# Patient Record
Sex: Female | Born: 1945 | Race: White | Hispanic: No | Marital: Married | Smoking: Former smoker
Health system: Southern US, Community
[De-identification: ages and names within clinical notes are randomized; demographics above are authoritative.]

## PROBLEM LIST (undated history)

## (undated) DIAGNOSIS — R42 Dizziness and giddiness: Secondary | ICD-10-CM

## (undated) DIAGNOSIS — E785 Hyperlipidemia, unspecified: Secondary | ICD-10-CM

## (undated) DIAGNOSIS — T148XXA Other injury of unspecified body region, initial encounter: Secondary | ICD-10-CM

## (undated) DIAGNOSIS — E669 Obesity, unspecified: Secondary | ICD-10-CM

## (undated) DIAGNOSIS — F419 Anxiety disorder, unspecified: Secondary | ICD-10-CM

## (undated) DIAGNOSIS — Z87898 Personal history of other specified conditions: Secondary | ICD-10-CM

## (undated) DIAGNOSIS — K219 Gastro-esophageal reflux disease without esophagitis: Secondary | ICD-10-CM

## (undated) DIAGNOSIS — S82899A Other fracture of unspecified lower leg, initial encounter for closed fracture: Secondary | ICD-10-CM

## (undated) DIAGNOSIS — R350 Frequency of micturition: Secondary | ICD-10-CM

## (undated) DIAGNOSIS — I4729 Other ventricular tachycardia: Secondary | ICD-10-CM

## (undated) DIAGNOSIS — I2510 Atherosclerotic heart disease of native coronary artery without angina pectoris: Secondary | ICD-10-CM

## (undated) DIAGNOSIS — G4700 Insomnia, unspecified: Secondary | ICD-10-CM

## (undated) DIAGNOSIS — K7689 Other specified diseases of liver: Secondary | ICD-10-CM

## (undated) DIAGNOSIS — N8110 Cystocele, unspecified: Secondary | ICD-10-CM

## (undated) DIAGNOSIS — I472 Ventricular tachycardia: Secondary | ICD-10-CM

## (undated) DIAGNOSIS — J329 Chronic sinusitis, unspecified: Secondary | ICD-10-CM

## (undated) DIAGNOSIS — S62109A Fracture of unspecified carpal bone, unspecified wrist, initial encounter for closed fracture: Secondary | ICD-10-CM

## (undated) DIAGNOSIS — S335XXA Sprain of ligaments of lumbar spine, initial encounter: Secondary | ICD-10-CM

## (undated) DIAGNOSIS — M810 Age-related osteoporosis without current pathological fracture: Secondary | ICD-10-CM

## (undated) DIAGNOSIS — T7840XA Allergy, unspecified, initial encounter: Secondary | ICD-10-CM

## (undated) HISTORY — DX: Allergy, unspecified, initial encounter: T78.40XA

## (undated) HISTORY — DX: Age-related osteoporosis without current pathological fracture: M81.0

## (undated) HISTORY — DX: Hyperlipidemia, unspecified: E78.5

## (undated) HISTORY — PX: ABDOMINAL HYSTERECTOMY: SHX81

## (undated) HISTORY — DX: Other ventricular tachycardia: I47.29

## (undated) HISTORY — DX: Ventricular tachycardia: I47.2

## (undated) HISTORY — PX: TONSILLECTOMY: SUR1361

## (undated) HISTORY — DX: Dizziness and giddiness: R42

## (undated) HISTORY — PX: BREAST BIOPSY: SHX20

---

## 2005-04-14 ENCOUNTER — Ambulatory Visit: Payer: Self-pay | Admitting: Unknown Physician Specialty

## 2005-06-29 ENCOUNTER — Ambulatory Visit: Payer: Self-pay | Admitting: Family Medicine

## 2006-09-06 ENCOUNTER — Ambulatory Visit: Payer: Self-pay | Admitting: Family Medicine

## 2008-03-20 ENCOUNTER — Ambulatory Visit: Payer: Self-pay | Admitting: Family Medicine

## 2008-05-19 LAB — HM DEXA SCAN

## 2009-04-13 ENCOUNTER — Ambulatory Visit: Payer: Self-pay | Admitting: Family Medicine

## 2010-04-04 HISTORY — PX: COLONOSCOPY: SHX174

## 2010-05-25 ENCOUNTER — Ambulatory Visit: Payer: Self-pay | Admitting: Family Medicine

## 2010-06-04 ENCOUNTER — Ambulatory Visit: Payer: Self-pay | Admitting: Internal Medicine

## 2010-07-26 ENCOUNTER — Ambulatory Visit: Payer: Self-pay | Admitting: Gastroenterology

## 2010-08-10 ENCOUNTER — Ambulatory Visit: Payer: Self-pay | Admitting: Gastroenterology

## 2010-10-24 LAB — HM COLONOSCOPY: HM Colonoscopy: NORMAL

## 2011-04-26 LAB — HM MAMMOGRAPHY: HM Mammogram: NORMAL

## 2011-06-21 ENCOUNTER — Ambulatory Visit: Payer: Self-pay | Admitting: Family Medicine

## 2011-08-09 ENCOUNTER — Telehealth: Payer: Self-pay | Admitting: Internal Medicine

## 2011-08-12 NOTE — Telephone Encounter (Signed)
Opened in error

## 2011-08-24 ENCOUNTER — Ambulatory Visit (INDEPENDENT_AMBULATORY_CARE_PROVIDER_SITE_OTHER): Payer: Medicare PPO | Admitting: Internal Medicine

## 2011-08-24 ENCOUNTER — Encounter: Payer: Self-pay | Admitting: Internal Medicine

## 2011-08-24 VITALS — BP 110/60 | HR 58 | Temp 98.1°F | Resp 16 | Ht 66.0 in | Wt 176.0 lb

## 2011-08-24 DIAGNOSIS — Z6825 Body mass index (BMI) 25.0-25.9, adult: Secondary | ICD-10-CM

## 2011-08-24 DIAGNOSIS — H81399 Other peripheral vertigo, unspecified ear: Secondary | ICD-10-CM

## 2011-08-24 DIAGNOSIS — R635 Abnormal weight gain: Secondary | ICD-10-CM

## 2011-08-24 DIAGNOSIS — E663 Overweight: Secondary | ICD-10-CM

## 2011-08-24 MED ORDER — ALPRAZOLAM 0.25 MG PO TABS: 0.2500 mg | ORAL_TABLET | Freq: Every evening | ORAL | Status: DC | PRN

## 2011-08-24 NOTE — Patient Instructions (Addendum)
Consider the Low Glycemic Index Diet and 6 smaller meals daily .  This boosts your metabolism and regulates your sugars:   7 AM Low carbohydrate Protein  Shakes (EAS Carb Control  Or Atkins ,  Available everywhere,   In  cases at [employer] )  2.5 carbs  (Add or substitute a toasted sandwhich thin w/ peanut butter)  10 AM: Protein bar by Atkins (snack size,  Chocolate lover's variety at  [employer])    Lunch: sandwich on pita bread or flatbread (Joseph's makes a pita bread and a flat bread , available at [employer] and [employer]; Toufayah makes a low carb flatbread available at [employer] and [employer]) Mission makes a low carb whole wheat tortilla available at [employer] most grocery stores   3 PM:  Mid day :  Another protein bar,  Or a  cheese stick, 1/4 cup of almonds, walnuts, pistachios, pecans, peanuts,  Macadamia nuts  6 PM  Dinner:  "mean and green:"  Meat/chicken/fish, salad, and green veggie : use ranch, vinagrette,  Blue cheese, etc  9 PM snack : Breyer's low carb fudgsicle or  ice cream bar (Carb Smart), or  Weight Watcher's ice cream bar , or another protein shake  Substitutions should be  20 carbs/serving  Or 100 cal snack   ------------------------------------------------------------------------------------------------------------------------------------  Trial of alprazolam for early morning wakening
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
## 2011-08-28 ENCOUNTER — Encounter: Payer: Self-pay | Admitting: Internal Medicine

## 2011-08-28 DIAGNOSIS — E785 Hyperlipidemia, unspecified: Secondary | ICD-10-CM | POA: Insufficient documentation

## 2011-08-28 DIAGNOSIS — H81399 Other peripheral vertigo, unspecified ear: Secondary | ICD-10-CM | POA: Insufficient documentation

## 2011-08-28 DIAGNOSIS — I4729 Other ventricular tachycardia: Secondary | ICD-10-CM | POA: Insufficient documentation

## 2011-08-28 DIAGNOSIS — I472 Ventricular tachycardia: Secondary | ICD-10-CM | POA: Insufficient documentation

## 2011-08-28 DIAGNOSIS — R635 Abnormal weight gain: Secondary | ICD-10-CM | POA: Insufficient documentation

## 2011-08-28 DIAGNOSIS — E663 Overweight: Secondary | ICD-10-CM | POA: Insufficient documentation

## 2011-08-28 LAB — HM PAP SMEAR

## 2011-08-28 NOTE — Assessment & Plan Note (Signed)
I have addressed  BMI and recommended a low glycemic index diet utilizing smaller more frequent meals to increase metabolism.  I have also recommended that patient start exercising with a goal of 30 minutes of aerobic exercise a minimum of 5 days per week. Screening for lipid disorders, thyroid and diabetes was done recently by former PCP and has been requested.

## 2011-08-28 NOTE — Assessment & Plan Note (Signed)
With recurrent episodes,  Prior MRI and CTs normal per patient.

## 2011-11-30 ENCOUNTER — Telehealth: Payer: Self-pay | Admitting: Internal Medicine

## 2011-11-30 MED ORDER — SIMVASTATIN 40 MG PO TABS: 40.0000 mg | ORAL_TABLET | Freq: Every evening | ORAL | Status: DC

## 2011-11-30 MED ORDER — CALCIUM CARBONATE 600 MG PO TABS: 600.0000 mg | ORAL_TABLET | Freq: Two times a day (BID) | ORAL | Status: DC

## 2011-11-30 NOTE — Telephone Encounter (Signed)
Cell # [phone] Pt came in checking on her rx refills For chol and bone density pt didn't know the name [facility] Please advise 90 day supply? refill

## 2012-01-17 ENCOUNTER — Telehealth: Payer: Self-pay | Admitting: Internal Medicine

## 2012-01-17 NOTE — Telephone Encounter (Signed)
Pt called needs refill for  Alendronate tablets 70mg  1 weekly Pt has 3 pills [facility] call [facility]

## 2012-01-18 MED ORDER — ALENDRONATE SODIUM 70 MG PO TABS: 70.0000 mg | ORAL_TABLET | ORAL | Status: DC

## 2012-01-18 NOTE — Telephone Encounter (Signed)
Refill for alendronate 70 mg  #12 (90 days) and 3 refills sent to [facility].

## 2012-01-19 NOTE — Telephone Encounter (Signed)
Patient notified

## 2012-02-06 ENCOUNTER — Ambulatory Visit (INDEPENDENT_AMBULATORY_CARE_PROVIDER_SITE_OTHER): Payer: Medicare PPO | Admitting: Internal Medicine

## 2012-02-06 ENCOUNTER — Encounter: Payer: Self-pay | Admitting: Internal Medicine

## 2012-02-06 ENCOUNTER — Ambulatory Visit: Payer: Medicare PPO | Admitting: Internal Medicine

## 2012-02-06 VITALS — BP 128/82 | HR 62 | Temp 98.0°F | Ht 66.0 in | Wt 179.5 lb

## 2012-02-06 DIAGNOSIS — Z0000 Encounter for general adult medical examination without abnormal findings: Secondary | ICD-10-CM

## 2012-02-06 DIAGNOSIS — Z13820 Encounter for screening for osteoporosis: Secondary | ICD-10-CM

## 2012-02-06 DIAGNOSIS — Z79899 Other long term (current) drug therapy: Secondary | ICD-10-CM

## 2012-02-06 LAB — COMPREHENSIVE METABOLIC PANEL
AST: 22 U/L (ref 0–37)
Albumin: 4.4 g/dL (ref 3.5–5.2)
BUN: 18 mg/dL (ref 6–23)
Calcium: 9.4 mg/dL (ref 8.4–10.5)
Chloride: 102 mEq/L (ref 96–112)
Potassium: 4.5 mEq/L (ref 3.5–5.1)
Total Protein: 7.1 g/dL (ref 6.0–8.3)

## 2012-02-06 NOTE — Patient Instructions (Addendum)
You may use the alprazolam  At 3 am if needed to go back to sleep.  We checked your liver function tests today  DEXA (bones density scan) to be ordered.    Try for 1200 mg calcium daily and 100 to 2000 units of Vit D daily during the winter months)

## 2012-02-06 NOTE — Progress Notes (Signed)
Patient ID: Jacqueline Kaiser, female   DOB: 1945/07/16, 66 y.o.   MRN: [id number] Subjective:   The patient is here for annual Medicare wellness examination and management of other chronic and acute problems.   The risk factors are reflected in the social history.  The roster of all physicians providing medical care to patient - is listed in the Snapshot section of the chart.  Activities of daily living:  The patient is 100% independent in all ADLs: dressing, toileting, feeding as well as independent mobility  Home safety : The patient has smoke detectors in the home. They wear seatbelts.  There are no firearms at home. There is no violence in the home.   There is no risks for hepatitis, STDs or HIV. There is no   history of blood transfusion. They have no travel history to infectious disease endemic areas of the world.  The patient has seen their dentist in the last six month. They have seen their eye doctor in the last year. They admit to slight hearing difficulty with regard to whispered voices and some television programs.  They have deferred audiologic testing in the last year.  They do not  have excessive sun exposure. Discussed the need for sun protection: hats, long sleeves and use of sunscreen if there is significant sun exposure.   Diet: the importance of a healthy diet is discussed. They do have a healthy diet.  The benefits of regular aerobic exercise were discussed. She walks 4 times per week ,  20 minutes.   Depression screen: there are no signs or vegative symptoms of depression- irritability, change in appetite, anhedonia, sadness/tearfullness.  Cognitive assessment: the patient manages all their financial and personal affairs and is actively engaged. They could relate day,date,year and events; recalled 2/3 objects at 3 minutes; performed clock-face test normally.  The following portions of the patient's history were reviewed and updated as appropriate: allergies, current  medications, past family history, past medical history,  past surgical history, past social history  and problem list.  Visual acuity was not assessed per patient preference since she has regular follow up with her ophthalmologist. Hearing and body mass index were assessed and reviewed.   During the course of the visit the patient was educated and counseled about appropriate screening and preventive services including : fall prevention , diabetes screening, nutrition counseling, colorectal cancer screening, and recommended immunizations.   Objective:     BP 128/82  Pulse 62  Temp 98 F (36.7 C) (Oral)  Ht 5\' 6"  (1.676 m)  Wt 179 lb 8 oz (81.421 kg)  BMI 28.97 kg/m2  SpO2 97%  General Appearance:    Alert, cooperative, no distress, appears stated age  Head:    Normocephalic, without obvious abnormality, atraumatic  Eyes:    PERRL, conjunctiva/corneas clear, EOM's intact, fundi    benign, both eyes  Ears:    Normal TM's and external ear canals, both ears  Nose:   Nares normal, septum midline, mucosa normal, no drainage    or sinus tenderness  Throat:   Lips, mucosa, and tongue normal; teeth and gums normal  Neck:   Supple, symmetrical, trachea midline, no adenopathy;    thyroid:  no enlargement/tenderness/nodules; no carotid   bruit or JVD  Back:     Symmetric, no curvature, ROM normal, no CVA tenderness  Lungs:     Clear to auscultation bilaterally, respirations unlabored  Chest Wall:    No tenderness or deformity   Heart:  Regular rate and rhythm, S1 and S2 normal, no murmur, rub   or gallop  Breast Exam:    No tenderness, masses, or nipple abnormality  Abdomen:     Soft, non-tender, bowel sounds active all four quadrants,    no masses, no organomegaly     Extremities:   Extremities normal, atraumatic, no cyanosis or edema  Pulses:   2+ and symmetric all extremities  Skin:   Skin color, texture, turgor normal, no rashes or lesions  Lymph nodes:   Cervical, supraclavicular,  and axillary nodes normal  Neurologic:   CNII-XII intact, normal strength, sensation and reflexes    throughout        Assessment:    Healthy female exam. Pelvic exam deferred as she is s/p total hysterectomy.         Plan:    DEXA scan ordered. Screening labs negative for diabetes, kidney or liver disease.

## 2012-02-08 ENCOUNTER — Other Ambulatory Visit: Payer: Self-pay

## 2012-02-08 DIAGNOSIS — Z0000 Encounter for general adult medical examination without abnormal findings: Secondary | ICD-10-CM | POA: Insufficient documentation

## 2012-02-08 MED ORDER — SIMVASTATIN 40 MG PO TABS: 40.0000 mg | ORAL_TABLET | Freq: Every evening | ORAL | Status: DC

## 2012-02-08 NOTE — Telephone Encounter (Signed)
Refill request for Simvastatin 40 mg #90 0 R sent electronic to [facility].

## 2012-02-08 NOTE — Assessment & Plan Note (Signed)
Comprehensive exam was done. Pelvic exam was deferred due to patient having history of total hysterectomy and recently being done.

## 2012-04-04 DIAGNOSIS — S82899A Other fracture of unspecified lower leg, initial encounter for closed fracture: Secondary | ICD-10-CM

## 2012-04-04 HISTORY — DX: Other fracture of unspecified lower leg, initial encounter for closed fracture: S82.899A

## 2012-05-28 ENCOUNTER — Other Ambulatory Visit: Payer: Self-pay | Admitting: Internal Medicine

## 2012-05-28 DIAGNOSIS — Z1239 Encounter for other screening for malignant neoplasm of breast: Secondary | ICD-10-CM

## 2012-05-30 ENCOUNTER — Telehealth: Payer: Self-pay | Admitting: Internal Medicine

## 2012-05-30 NOTE — Telephone Encounter (Signed)
Pt states she is returning a call she received about getting her results.

## 2012-05-30 NOTE — Telephone Encounter (Signed)
After speaking with the patient she realized that it was her mother that someone had called about. Patient requested that I call and talk with her mom.

## 2012-06-05 ENCOUNTER — Emergency Department: Payer: Self-pay | Admitting: Emergency Medicine

## 2012-06-25 ENCOUNTER — Ambulatory Visit: Payer: Self-pay | Admitting: Internal Medicine

## 2012-06-25 LAB — HM MAMMOGRAPHY: HM MAMMO: NORMAL

## 2012-06-27 ENCOUNTER — Other Ambulatory Visit: Payer: Self-pay | Admitting: *Deleted

## 2012-06-27 MED ORDER — SIMVASTATIN 40 MG PO TABS: 40.0000 mg | ORAL_TABLET | Freq: Every evening | ORAL | Status: DC

## 2012-06-27 NOTE — Telephone Encounter (Signed)
Med filled on 3/26.

## 2012-06-28 ENCOUNTER — Telehealth: Payer: Self-pay | Admitting: Emergency Medicine

## 2012-06-28 NOTE — Telephone Encounter (Signed)
Called [facility] [phone] (mail order ) and was able to confirm that the script for Zocor was received. It has also been processed,mailed to patient and she should receive the medication in 7-10 days. Called patient in follow-up and she is aware and verbalized understanding. I did confirm that she has enough Zocor on hand until her new prescription arrives.

## 2012-06-28 NOTE — Telephone Encounter (Signed)
Jacqueline Kaiser,  Can you address this issue?    To figure out where the problem is.   thanks

## 2012-06-28 NOTE — Telephone Encounter (Signed)
Ms. Jacqueline Kaiser called and stated she has left 3 messages on the machine for someone to call her in reference to getting a refill on her Zocor. [facility] has been faxing since sometime last week for this refill and after 3 attempts they have told the pt to call. The patient has a "few" Zocor left. Please advise

## 2012-06-28 NOTE — Telephone Encounter (Signed)
This was filled on 3/26.

## 2012-07-18 ENCOUNTER — Encounter: Payer: Self-pay | Admitting: Internal Medicine

## 2012-09-06 ENCOUNTER — Telehealth: Payer: Self-pay | Admitting: Internal Medicine

## 2012-09-06 DIAGNOSIS — E785 Hyperlipidemia, unspecified: Secondary | ICD-10-CM

## 2012-09-06 DIAGNOSIS — Z79899 Other long term (current) drug therapy: Secondary | ICD-10-CM

## 2012-09-06 MED ORDER — SIMVASTATIN 40 MG PO TABS: 40.0000 mg | ORAL_TABLET | Freq: Every evening | ORAL | Status: DC

## 2012-09-06 NOTE — Telephone Encounter (Signed)
simvastatin (ZOCOR) 40 MG tablet #90  alendronate (FOSAMAX) 70 MG tablet #

## 2012-09-06 NOTE — Telephone Encounter (Signed)
30 DAYS ONLY,  SENT TO LOCAL PHARMACY.  PLEASE NOTIFY PATIENT THAT  SHE NEEDS FASTING LABS ASAP

## 2012-09-06 NOTE — Telephone Encounter (Signed)
Patient has not been seen since 11/13 and no lipid panel or blood work since before that time please advise as to refill.

## 2012-09-06 NOTE — Telephone Encounter (Signed)
Patient notified by detailed message on voicemail per DPR.

## 2012-10-16 ENCOUNTER — Other Ambulatory Visit (INDEPENDENT_AMBULATORY_CARE_PROVIDER_SITE_OTHER): Payer: Medicare PPO

## 2012-10-16 DIAGNOSIS — E785 Hyperlipidemia, unspecified: Secondary | ICD-10-CM

## 2012-10-16 DIAGNOSIS — Z79899 Other long term (current) drug therapy: Secondary | ICD-10-CM

## 2012-10-16 LAB — COMPREHENSIVE METABOLIC PANEL
AST: 20 U/L (ref 0–37)
Albumin: 4.2 g/dL (ref 3.5–5.2)
Alkaline Phosphatase: 54 U/L (ref 39–117)
Calcium: 9.3 mg/dL (ref 8.4–10.5)
Chloride: 105 mEq/L (ref 96–112)
Potassium: 4.4 mEq/L (ref 3.5–5.1)
Sodium: 139 mEq/L (ref 135–145)
Total Protein: 6.5 g/dL (ref 6.0–8.3)

## 2012-10-16 LAB — LIPID PANEL: Total CHOL/HDL Ratio: 3

## 2012-10-18 ENCOUNTER — Encounter: Payer: Self-pay | Admitting: *Deleted

## 2012-11-06 ENCOUNTER — Telehealth: Payer: Self-pay | Admitting: Internal Medicine

## 2012-11-06 MED ORDER — SIMVASTATIN 40 MG PO TABS: 40.0000 mg | ORAL_TABLET | Freq: Every evening | ORAL | Status: DC

## 2012-11-06 MED ORDER — ALENDRONATE SODIUM 70 MG PO TABS: 70.0000 mg | ORAL_TABLET | ORAL | Status: DC

## 2012-11-06 NOTE — Telephone Encounter (Signed)
Pt is needing refill on Cholesterol meds and Fosmax.Pt uses [facility]

## 2012-11-06 NOTE — Telephone Encounter (Signed)
Medication sent.

## 2013-02-20 ENCOUNTER — Telehealth: Payer: Self-pay | Admitting: *Deleted

## 2013-04-11 NOTE — Telephone Encounter (Signed)
changeed pharmacy as requested.

## 2013-05-09 ENCOUNTER — Ambulatory Visit (INDEPENDENT_AMBULATORY_CARE_PROVIDER_SITE_OTHER): Payer: Medicare PPO | Admitting: Internal Medicine

## 2013-05-09 ENCOUNTER — Encounter: Payer: Self-pay | Admitting: Internal Medicine

## 2013-05-09 ENCOUNTER — Ambulatory Visit (INDEPENDENT_AMBULATORY_CARE_PROVIDER_SITE_OTHER)
Admission: RE | Admit: 2013-05-09 | Discharge: 2013-05-09 | Disposition: A | Discharge status: Home / Self Care | Payer: Medicare PPO | Source: Ambulatory Visit | Attending: Internal Medicine | Admitting: Internal Medicine

## 2013-05-09 VITALS — BP 120/84 | HR 70 | Temp 98.1°F | Wt 185.0 lb

## 2013-05-09 DIAGNOSIS — J209 Acute bronchitis, unspecified: Secondary | ICD-10-CM

## 2013-05-09 DIAGNOSIS — R059 Cough, unspecified: Secondary | ICD-10-CM

## 2013-05-09 DIAGNOSIS — R05 Cough: Secondary | ICD-10-CM

## 2013-05-09 MED ORDER — LEVOFLOXACIN 500 MG PO TABS: 500.0000 mg | ORAL_TABLET | Freq: Every day | ORAL | Status: DC

## 2013-05-09 MED ORDER — PREDNISONE (PAK) 10 MG PO TABS: ORAL_TABLET | ORAL | Status: DC

## 2013-05-09 NOTE — Progress Notes (Signed)
Pre-visit discussion using our clinic review tool. No additional management support is needed unless otherwise documented below in the visit note.  

## 2013-05-09 NOTE — Progress Notes (Signed)
Subjective:    Patient ID: Jacqueline Kaiser, female    DOB: [birth year], 68 y.o.   MRN: [id number]  HPI 68YO female presents for acute visit for cough. Symptoms initially started early January with cough and dyspnea. Seen at [facility] urgent care twice. Tested for flu which was negative.Treated with Azithromycin and Prednisone as well as Tussionex. Symptoms got somewhat better, however cough persistent. Episodes of cough described as severe. Occasionally productive of white-yellow sputum. Reports extreme fatigue. No fever or chills. No chest pain. Persistent mild shortness of breath. Former smoker.  Review of Systems  Constitutional: Positive for fatigue. Negative for fever, chills and unexpected weight change.  HENT: Negative for congestion, ear discharge, ear pain, facial swelling, hearing loss, mouth sores, nosebleeds, postnasal drip, rhinorrhea, sinus pressure, sneezing, sore throat, tinnitus, trouble swallowing and voice change.   Eyes: Negative for pain, discharge, redness and visual disturbance.  Respiratory: Positive for cough, chest tightness and shortness of breath. Negative for wheezing and stridor.   Cardiovascular: Negative for chest pain, palpitations and leg swelling.  Musculoskeletal: Negative for arthralgias, myalgias, neck pain and neck stiffness.  Skin: Negative for color change and rash.  Neurological: Negative for dizziness, weakness, light-headedness and headaches.  Hematological: Negative for adenopathy.       Objective:    BP 120/84  Pulse 70  Temp(Src) 98.1 F (36.7 C) (Oral)  Wt 185 lb (83.915 kg)  SpO2 98% Physical Exam  Constitutional: She is oriented to person, place, and time. She appears well-developed and well-nourished. No distress.  HENT:  Head: Normocephalic and atraumatic.  Right Ear: External ear normal.  Left Ear: External ear normal.  Nose: Nose normal.  Mouth/Throat: Oropharynx is clear and moist. No oropharyngeal exudate.  Eyes: Conjunctivae  are normal. Pupils are equal, round, and reactive to light. Right eye exhibits no discharge. Left eye exhibits no discharge. No scleral icterus.  Neck: Normal range of motion. Neck supple. No tracheal deviation present. No thyromegaly present.  Cardiovascular: Normal rate, regular rhythm, normal heart sounds and intact distal pulses.  Exam reveals no gallop and no friction rub.   No murmur heard. Pulmonary/Chest: Effort normal and breath sounds normal. No accessory muscle usage. Not tachypneic. No respiratory distress. She has no decreased breath sounds. She has no wheezes. She has no rhonchi. She has no rales. She exhibits no tenderness.  Musculoskeletal: Normal range of motion. She exhibits no edema and no tenderness.  Lymphadenopathy:    She has no cervical adenopathy.  Neurological: She is alert and oriented to person, place, and time. No cranial nerve deficit. She exhibits normal muscle tone. Coordination normal.  Skin: Skin is warm and dry. No rash noted. She is not diaphoretic. No erythema. No pallor.  Psychiatric: She has a normal mood and affect. Her behavior is normal. Judgment and thought content normal.          Assessment & Plan:   Problem List Items Addressed This Visit   Acute bronchitis     As above, one-month history of cough is most consistent with pertussis, however given persistent dyspnea and occasional productive cough, question underlying reactive airways and acute bronchitis. Will empirically treat with a second prednisone taper and course of Levaquin. Follow up 1-2 weeks for recheck or sooner as needed.    Relevant Medications      levofloxacin (LEVAQUIN) tablet      predniSONE (DELTASONE) tablet pack   Other Relevant Orders      Comp Met (CMET)  CBC   Cough - Primary     One-month history of cough with paroxysmal episodes which are most consistent with pertussis. Antibody testing for pertussis sent today. Patient has already been treated with azithromycin.  Exam today is normal. Chest x-ray today normal. We discussed that if her cough is secondary to pertussis, symptoms may persist for 3 months. Will continue to use over-the-counter NyQuil cough syrup which has worked well for her.    Relevant Orders      DG Chest 2 View (Completed)      Bordetella pertussis antibody       Return in about 1 week (around 05/16/2013) for Recheck cough.

## 2013-05-09 NOTE — Patient Instructions (Signed)
Start Prednisone taper and Levaquin.  Chest xray today at [facility].  Follow up next week.

## 2013-05-09 NOTE — Assessment & Plan Note (Addendum)
As above, one-month history of cough is most consistent with pertussis, however given persistent dyspnea and occasional productive cough, question underlying reactive airways and acute bronchitis. Will empirically treat with a second prednisone taper and course of Levaquin. Follow up 1-2 weeks for recheck or sooner as needed.

## 2013-05-09 NOTE — Assessment & Plan Note (Signed)
One-month history of cough with paroxysmal episodes which are most consistent with pertussis. Antibody testing for pertussis sent today. Patient has already been treated with azithromycin. Exam today is normal. Chest x-ray today normal. We discussed that if her cough is secondary to pertussis, symptoms may persist for 3 months. Will continue to use over-the-counter NyQuil cough syrup which has worked well for her.

## 2013-05-10 LAB — CBC
HEMATOCRIT: 43.9 % (ref 36.0–46.0)
HEMOGLOBIN: 14.6 g/dL (ref 12.0–15.0)
MCHC: 33.3 g/dL (ref 30.0–36.0)
MCV: 93.5 fl (ref 78.0–100.0)
Platelets: 223 10*3/uL (ref 150.0–400.0)
RBC: 4.7 Mil/uL (ref 3.87–5.11)
RDW: 12.9 % (ref 11.5–14.6)
WBC: 4.4 10*3/uL — AB (ref 4.5–10.5)

## 2013-05-12 LAB — BORDETELLA PERTUSSIS ANTIBODY
B pertussis IgA Ab, Quant: 0.1 U/mL (ref <=1.1)
B pertussis IgG Ab, Quant: 0.8 U/mL (ref 0.0–0.9)
B pertussis IgM Ab, Quant: 0.6 U/mL (ref <=1.1)

## 2013-05-13 ENCOUNTER — Encounter: Payer: Self-pay | Admitting: *Deleted

## 2013-05-13 LAB — COMPREHENSIVE METABOLIC PANEL
ALK PHOS: 55 U/L (ref 39–117)
ALT: 17 U/L (ref 0–35)
AST: 30 U/L (ref 0–37)
Albumin: 4.2 g/dL (ref 3.5–5.2)
BILIRUBIN TOTAL: 0.5 mg/dL (ref 0.3–1.2)
BUN: 20 mg/dL (ref 6–23)
CO2: 28 mEq/L (ref 19–32)
Calcium: 8.7 mg/dL (ref 8.4–10.5)
Chloride: 103 mEq/L (ref 96–112)
Creatinine, Ser: 0.8 mg/dL (ref 0.4–1.2)
GFR: 72.32 mL/min (ref >60.00)
GLUCOSE: 105 mg/dL — AB (ref 70–99)
Potassium: 4.2 mEq/L (ref 3.5–5.1)
SODIUM: 137 meq/L (ref 135–145)
TOTAL PROTEIN: 6.8 g/dL (ref 6.0–8.3)

## 2013-05-14 ENCOUNTER — Encounter: Payer: Self-pay | Admitting: *Deleted

## 2013-05-17 ENCOUNTER — Encounter: Payer: Self-pay | Admitting: Internal Medicine

## 2013-05-17 ENCOUNTER — Ambulatory Visit (INDEPENDENT_AMBULATORY_CARE_PROVIDER_SITE_OTHER): Payer: Medicare HMO | Admitting: Internal Medicine

## 2013-05-17 VITALS — BP 116/78 | HR 80 | Temp 97.5°F | Resp 18 | Wt 181.5 lb

## 2013-05-17 DIAGNOSIS — M899 Disorder of bone, unspecified: Secondary | ICD-10-CM

## 2013-05-17 DIAGNOSIS — R05 Cough: Secondary | ICD-10-CM

## 2013-05-17 DIAGNOSIS — M8580 Other specified disorders of bone density and structure, unspecified site: Secondary | ICD-10-CM

## 2013-05-17 DIAGNOSIS — M949 Disorder of cartilage, unspecified: Secondary | ICD-10-CM

## 2013-05-17 DIAGNOSIS — R059 Cough, unspecified: Secondary | ICD-10-CM

## 2013-05-17 DIAGNOSIS — J209 Acute bronchitis, unspecified: Secondary | ICD-10-CM

## 2013-05-17 LAB — POCT INFLUENZA A/B
INFLUENZA A, POC: NEGATIVE
Influenza B, POC: NEGATIVE

## 2013-05-17 MED ORDER — PREDNISONE 10 MG PO TABS: ORAL_TABLET | ORAL | Status: DC

## 2013-05-17 MED ORDER — PREDNISONE (PAK) 10 MG PO TABS: ORAL_TABLET | ORAL | Status: DC

## 2013-05-17 MED ORDER — MOMETASONE FUROATE 220 MCG/INH IN AEPB: 2.0000 | INHALATION_SPRAY | Freq: Every day | RESPIRATORY_TRACT | Status: DC

## 2013-05-17 MED ORDER — GUAIFENESIN-CODEINE 100-10 MG/5ML PO SYRP: 5.0000 mL | ORAL_SOLUTION | Freq: Three times a day (TID) | ORAL | Status: DC | PRN

## 2013-05-17 NOTE — Patient Instructions (Signed)
I am treating your a a viral induced bronchitis with a steorid inhaler and a steroid taper  If your flu test is positive, I will call you in tamiflu to take for 5 days

## 2013-05-17 NOTE — Progress Notes (Signed)
Patient ID: Jacqueline Kaiser, female   DOB: 1945/09/03, 68 y.o.   MRN: [id number]  Patient Active Problem List   Diagnosis Date Noted  . Osteopenia 05/19/2013  . Cough 05/09/2013  . Acute bronchitis 05/09/2013  . Routine general medical examination at a health care facility 02/08/2012  . Overweight (BMI 25.0-29.9) 08/28/2011  . Weight gain, abnormal 08/28/2011  . Peripheral vertigo 08/28/2011  . Nonsustained ventricular tachycardia   . Hyperlipidemia     Subjective:  CC:   Chief Complaint  Patient presents with  . Follow-up  . Cough    productive yellow mucus    HPI:   Jacqueline Kaiser is a 68 y.o. female who presents for  Persistent cough since Jan 5th .  Has been treated 3 times thus far with antibiotic, both s with and withut prednisone. Felt better by Tuesday of this week,  Then wednesday started feeling bad again.  Reports malaise, heaviness in chest.   Cough is productive of scant and  Clear sputum.  .  No fevers.  Chest x ray was done last week and showed hyperinflated lungs, no infiltrates. Testing for flu and pertussis were negative.   Past Medical History  Diagnosis Date  . Nonsustained ventricular tachycardia     by Holter,  noraml Stress test ECHO perpatient [name], [place])  . Hyperlipidemia     Past Surgical History  Procedure Laterality Date  . Abdominal hysterectomy    . Cesarean section         The following portions of the patient's history were reviewed and updated as appropriate: Allergies, current medications, and problem list.    Review of Systems:   Patient denies headache, fevers, malaise, unintentional weight loss, skin rash, eye pain, sinus congestion and sinus pain, sore throat, dysphagia,  hemoptysis , cough, dyspnea, wheezing, chest pain, palpitations, orthopnea, edema, abdominal pain, nausea, melena, diarrhea, constipation, flank pain, dysuria, hematuria, urinary  Frequency, nocturia, numbness, tingling, seizures,  Focal weakness, Loss of  consciousness,  Tremor, insomnia, depression, anxiety, and suicidal ideation.     History   Social History  . Marital Status: Married    Spouse Name: N/A    Number of Children: N/A  . Years of Education: N/A   Occupational History  . Not on file.   Social History Main Topics  . Smoking status: Former Smoker -- 10 years    Types: Cigarettes    Quit date: 08/24/1971  . Smokeless tobacco: Never Used  . Alcohol Use: No  . Drug Use: No  . Sexual Activity: Not Currently   Other Topics Concern  . Not on file   Social History Narrative  . No narrative on file    Objective:  Filed Vitals:   05/17/13 1442  BP: 116/78  Pulse: 80  Temp: 97.5 F (36.4 C)  Resp: 18     General appearance: alert, cooperative and appears stated age Ears: normal TM's and external ear canals both ears Throat: lips, mucosa, and tongue normal; teeth and gums normal Neck: no adenopathy, no carotid bruit, supple, symmetrical, trachea midline and thyroid not enlarged, symmetric, no tenderness/mass/nodules Back: symmetric, no curvature. ROM normal. No CVA tenderness. Lungs: clear to auscultation bilaterally Heart: regular rate and rhythm, S1, S2 normal, no murmur, click, rub or gallop Abdomen: soft, non-tender; bowel sounds normal; no masses,  no organomegaly Pulses: 2+ and symmetric Skin: Skin color, texture, turgor normal. No rashes or lesions Lymph nodes: Cervical, supraclavicular, and axillary nodes normal.  Assessment and Plan:  Osteopenia  Managed with alendronate.  Per [place] records fro m2010, BD has improved.  DEXA was ordered in 2013 but not done.   Acute bronchitis Recurrent,  With hyperinflation noted on recent chest x ray.  Pertussis screening was negative,  Influenza test today was negative Prednisone taper repeated but extended and steroid inhaler given.  Discussed need for PFTs when symptoms have resolved.   Cough Two-month history of cough with negative testing for bordatella  and chest x-ray suggestive of asthma .  Starting steroid inhaler,  Needs PFTS and allergy testing if not resolved after extended steroid taper     Updated Medication List Outpatient Encounter Prescriptions as of 05/17/2013  Medication Sig  . alendronate (FOSAMAX) 70 MG tablet Take 1 tablet (70 mg total) by mouth every 7 (seven) days. Take with a full glass of water on an empty stomach.  . calcium carbonate (OS-CAL) 600 MG TABS Take 1 tablet (600 mg total) by mouth 2 (two) times daily with a meal.  . cholecalciferol (VITAMIN D) 1000 UNITS tablet Take 1,000 Units by mouth daily.  . meclizine (ANTIVERT) 12.5 MG tablet Take 12.5 mg by mouth 3 (three) times daily as needed.  . simvastatin (ZOCOR) 40 MG tablet Take 1 tablet (40 mg total) by mouth every evening.  Marland Kitchen guaiFENesin-codeine (ROBITUSSIN AC) 100-10 MG/5ML syrup Take 5 mLs by mouth 3 (three) times daily as needed for cough.  . mometasone (ASMANEX 14 METERED DOSES) 220 MCG/INH inhaler Inhale 2 puffs into the lungs daily.  . predniSONE (DELTASONE) 10 MG tablet 6 tablets daily x 3 days, then reduce daily by 1 tablet until gone  . predniSONE (STERAPRED UNI-PAK) 10 MG tablet Take 60mg  day 1, then taper by 10mg  daily until gone.  . [DISCONTINUED] levofloxacin (LEVAQUIN) 500 MG tablet Take 1 tablet (500 mg total) by mouth daily.  . [DISCONTINUED] predniSONE (STERAPRED UNI-PAK) 10 MG tablet Take 60mg  day 1, then taper by 10mg  daily until gone.

## 2013-05-17 NOTE — Progress Notes (Signed)
Pre-visit discussion using our clinic review tool. No additional management support is needed unless otherwise documented below in the visit note.  

## 2013-05-19 ENCOUNTER — Encounter: Payer: Self-pay | Admitting: Internal Medicine

## 2013-05-19 DIAGNOSIS — M818 Other osteoporosis without current pathological fracture: Secondary | ICD-10-CM

## 2013-05-19 DIAGNOSIS — M810 Age-related osteoporosis without current pathological fracture: Secondary | ICD-10-CM | POA: Insufficient documentation

## 2013-05-19 NOTE — Assessment & Plan Note (Signed)
Two-month history of cough with negative testing for bordatella and chest x-ray suggestive of asthma .  Starting steroid inhaler,  Needs PFTS and allergy testing if not resolved after extended steroid taper

## 2013-05-19 NOTE — Assessment & Plan Note (Addendum)
Recurrent,  With hyperinflation noted on recent chest x ray.  Pertussis screening was negative,  Influenza test today was negative Prednisone taper repeated but extended and steroid inhaler given.  Discussed need for PFTs when symptoms have resolved.

## 2013-05-19 NOTE — Assessment & Plan Note (Signed)
Managed with alendronate.  Per [place] records fro m2010, BD has improved.  DEXA was ordered in 2013 but not done.

## 2013-07-01 ENCOUNTER — Encounter: Payer: Self-pay | Admitting: Adult Health

## 2013-07-01 ENCOUNTER — Ambulatory Visit (INDEPENDENT_AMBULATORY_CARE_PROVIDER_SITE_OTHER): Payer: Medicare PPO | Admitting: Adult Health

## 2013-07-01 VITALS — BP 110/64 | HR 68 | Temp 98.5°F | Resp 14 | Wt 191.0 lb

## 2013-07-01 DIAGNOSIS — R05 Cough: Secondary | ICD-10-CM

## 2013-07-01 DIAGNOSIS — T7840XA Allergy, unspecified, initial encounter: Secondary | ICD-10-CM

## 2013-07-01 DIAGNOSIS — R059 Cough, unspecified: Secondary | ICD-10-CM

## 2013-07-01 NOTE — Patient Instructions (Signed)
  start Dymista 1 spray into each nostril twice a day.  Take an antihistamine such as Claritin, Allegra or Zyrtec daily.  I am referring you to [facility].

## 2013-07-01 NOTE — Progress Notes (Signed)
Patient ID: Jacqueline Kaiser, female   DOB: 03-Jan-1946, 68 y.o.   MRN: [id number]    Subjective:    Patient ID: Jacqueline Kaiser, female    DOB: [birth year], 68 y.o.   MRN: [id number]  HPI  Pt is a 68 y/o female who presents to clinic with nasal congestion and ongoing cough. She was seen in clinic by PCP on 05/19/13 and prescribed extended prednisone taper. She was also given a steroid inhaler. It was also recommended that she have PFTs once symptoms resolved and allergy testing. She also had an xray that showed hyperinflation typically seen in Asthma. Pt reports that she does not feel bad. She feels more short of breath with exertion than typical. Reports going up 3 flights of stairs to visit her mother and feeling short winded. No sob at rest. No fever, chills. Cough is non productive. Denies wheezing.  Past Medical History  Diagnosis Date  . Nonsustained ventricular tachycardia     by Holter,  noraml Stress test ECHO perpatient Jacqueline Kaiser, [place])  . Hyperlipidemia      Current Outpatient Prescriptions on File Prior to Visit  Medication Sig Dispense Refill  . alendronate (FOSAMAX) 70 MG tablet Take 1 tablet (70 mg total) by mouth every 7 (seven) days. Take with a full glass of water on an empty stomach.  36 tablet  3  . calcium carbonate (OS-CAL) 600 MG TABS Take 1 tablet (600 mg total) by mouth 2 (two) times daily with a meal.  180 tablet  0  . cholecalciferol (VITAMIN D) 1000 UNITS tablet Take 1,000 Units by mouth daily.      Marland Kitchen guaiFENesin-codeine (ROBITUSSIN AC) 100-10 MG/5ML syrup Take 5 mLs by mouth 3 (three) times daily as needed for cough.  180 mL  0  . meclizine (ANTIVERT) 12.5 MG tablet Take 12.5 mg by mouth 3 (three) times daily as needed.      . mometasone (ASMANEX 14 METERED DOSES) 220 MCG/INH inhaler Inhale 2 puffs into the lungs daily.  2 Inhaler  0  . predniSONE (DELTASONE) 10 MG tablet 6 tablets daily x 3 days, then reduce daily by 1 tablet until gone  33 tablet  0  . predniSONE  (STERAPRED UNI-PAK) 10 MG tablet Take 60mg  day 1, then taper by 10mg  daily until gone.  21 tablet  0  . simvastatin (ZOCOR) 40 MG tablet Take 1 tablet (40 mg total) by mouth every evening.  90 tablet  2   No current facility-administered medications on file prior to visit.     Review of Systems  Constitutional: Negative for fever and chills.  HENT: Positive for congestion and postnasal drip. Negative for rhinorrhea, sinus pressure and sore throat.   Respiratory: Positive for cough and chest tightness. Shortness of breath: shortnes of breath with exertion - mild.   Cardiovascular: Negative.   Gastrointestinal: Negative.   All other systems reviewed and are negative.       Objective:  BP 110/64  Pulse 68  Temp(Src) 98.5 F (36.9 C) (Oral)  Wt 191 lb (86.637 kg)  SpO2 96%   Physical Exam  Constitutional: She is oriented to person, place, and time. No distress.  Overweight, pleasant female  HENT:  Head: Normocephalic and atraumatic.  Cardiovascular: Normal rate, normal heart sounds and intact distal pulses.  Exam reveals no gallop.   No murmur heard. Pulmonary/Chest: Effort normal and breath sounds normal. No respiratory distress. She has no wheezes. She has no rales.  Good air movement throughout. No rhonchi,  wheezing or rales heard  Neurological: She is alert and oriented to person, place, and time.  Psychiatric: She has a normal mood and affect. Her behavior is normal. Thought content normal.      Assessment & Plan:   1. Cough Ongoing cough. Suspect may be secondary to allergies. No wheezing. No shortness of breath at rest. Trial of Dymista nasal spray to see if symptoms improve. Discussed referral to Pulmonology as recommended by PCP for PFTs and r/o asthma. I am referring her for allergy testing.  2. Allergy Start Dymista. Continue nettie pot. Start antihistamine such as claritin, allegra or zyrtec. Refer to [facility]. - Ambulatory referral to Allergy

## 2013-07-01 NOTE — Progress Notes (Signed)
Pre visit review using our clinic review tool, if applicable. No additional management support is needed unless otherwise documented below in the visit note. 

## 2013-07-02 ENCOUNTER — Encounter: Payer: Self-pay | Admitting: Pulmonary Disease

## 2013-07-02 ENCOUNTER — Ambulatory Visit (INDEPENDENT_AMBULATORY_CARE_PROVIDER_SITE_OTHER): Payer: Medicare PPO | Admitting: Pulmonary Disease

## 2013-07-02 VITALS — BP 124/78 | HR 67 | Ht 67.0 in | Wt 190.0 lb

## 2013-07-02 DIAGNOSIS — R05 Cough: Secondary | ICD-10-CM

## 2013-07-02 DIAGNOSIS — R0989 Other specified symptoms and signs involving the circulatory and respiratory systems: Secondary | ICD-10-CM

## 2013-07-02 DIAGNOSIS — R059 Cough, unspecified: Secondary | ICD-10-CM

## 2013-07-02 DIAGNOSIS — J329 Chronic sinusitis, unspecified: Secondary | ICD-10-CM

## 2013-07-02 MED ORDER — TRAMADOL HCL 50 MG PO TABS: 50.0000 mg | ORAL_TABLET | Freq: Four times a day (QID) | ORAL | Status: DC | PRN

## 2013-07-02 NOTE — Assessment & Plan Note (Signed)
Her ongoing sinusitis symptoms are causing the cough.  It sounds like some degree of allergic rhinitis but I worry about chronic infecious sinusitis.  Plan -ct sinus -add saline rinse -continue Dymista -add phenylephrine  -may need ENT referral

## 2013-07-02 NOTE — Patient Instructions (Signed)
Take the Tramadol every 6 hours as needed for the cough Pick three days when you don't laugh, talk, sing, or clear your throat.  During this time suppress your cough as much as possible Gradually back off on the tramadol after three days Use hard candies or warm beverages to soothe your throat, don't use candies that contain mint  Clean out your left ear with water-peroxide based solution as we discussed  We will arrange lung function testing and a CT scan of your sinuses  For the sinuses: use Milta Deiters Med rinses twice a day with distilled water; then use the Dymista 30 minutes afterwards Also try using phenylephrine-based over the counter decongestants  We will see you back in 4 weeks or sooner if needed

## 2013-07-02 NOTE — Assessment & Plan Note (Signed)
I explained to Jacqueline Kaiser that the most likely reason for her cough is the ongoing post nasal drip from sinus congestion as well as tracheal/laryngeal irritation.  She does have a mild upper airway wheeze but I don't hear any in her lower airways so I doubt we are dealing with asthma.  Nevertheless we will get PFTs to rule that out.  I am happy that her recent Chest X-ray looked OK with the exception of a suggestion of hyperinflation.  Plan: -three days of voice rest and try to suppress cough, use tramadol, hard candies to soothe throat -tramadol 50mg  po q6h prn cough -PFT -see sinus congestion

## 2013-07-02 NOTE — Progress Notes (Signed)
Subjective:    Patient ID: Jacqueline Kaiser, female    DOB: [birth year], 68 y.o.   MRN: [id number]  HPI  Hajer is here to see me for chronic cough that hast not gotten better with multiple treatments lately.  She developed the cough at the first of the year with a severe cough which would not getting better.  She has been treated withcough syrups and prednisone and has gained 20 lbs.  The cough happnes more the evening that ay other time per day, not necessarily after dinner.  She doesn't hink that food makes a difference.  The cough isn't bad at night.  The cough is typically dry and feels a chest pressure in the mid substernal area.    Child hood was normal without respiratory problems.  She never had a respiratory problem in the past.    She has a "snotty nose" with this. She has also noted shortness of breath lately when carrying things up a flight of stairs.    She smoked < 0.5 pack per day for abou 7-8 years and quit 35 year ago.    Past Medical History  Diagnosis Date  . Nonsustained ventricular tachycardia     by Holter,  noraml Stress test ECHO perpatient [name], [place])  . Hyperlipidemia      Family History  Problem Relation Age of Onset  . Heart disease Mother     ATRIAL FIB  . Cancer Father 34    Lung Ca,  nonsmoker, metastatic at diagnosis  asbestos exposure     History   Social History  . Marital Status: Married    Spouse Name: N/A    Number of Children: N/A  . Years of Education: N/A   Occupational History  . Not on file.   Social History Main Topics  . Smoking status: Former Smoker -- 0.50 packs/day for 10 years    Types: Cigarettes    Quit date: 08/24/1971  . Smokeless tobacco: Never Used  . Alcohol Use: No  . Drug Use: No  . Sexual Activity: Not Currently   Other Topics Concern  . Not on file   Social History Narrative  . No narrative on file     Allergies  Allergen Reactions  . Iodine      Outpatient Prescriptions Prior to Visit   Medication Sig Dispense Refill  . alendronate (FOSAMAX) 70 MG tablet Take 1 tablet (70 mg total) by mouth every 7 (seven) days. Take with a full glass of water on an empty stomach.  36 tablet  3  . calcium carbonate (OS-CAL) 600 MG TABS Take 1 tablet (600 mg total) by mouth 2 (two) times daily with a meal.  180 tablet  0  . cholecalciferol (VITAMIN D) 1000 UNITS tablet Take 1,000 Units by mouth daily.      . simvastatin (ZOCOR) 40 MG tablet Take 1 tablet (40 mg total) by mouth every evening.  90 tablet  2   No facility-administered medications prior to visit.      Review of Systems  Constitutional: Negative for fever and unexpected weight change.  HENT: Positive for rhinorrhea. Negative for congestion, dental problem, ear pain, nosebleeds, postnasal drip, sinus pressure, sneezing, sore throat and trouble swallowing.   Eyes: Negative for redness and itching.  Respiratory: Positive for cough. Negative for chest tightness, shortness of breath and wheezing.   Cardiovascular: Negative for palpitations and leg swelling.  Gastrointestinal: Negative for nausea and vomiting.  Genitourinary: Negative for dysuria.  Musculoskeletal:  Negative for joint swelling.  Skin: Negative for rash.  Neurological: Negative for headaches.  Hematological: Does not bruise/bleed easily.  Psychiatric/Behavioral: Negative for dysphoric mood. The patient is not nervous/anxious.        Objective:   Physical Exam  Filed Vitals:   07/02/13 1544  BP: 124/78  Pulse: 67  Height: 5\' 7"  (1.702 m)  Weight: 190 lb (86.183 kg)  SpO2: 97%  RA  Gen: well appearing, no acute distress HEENT: NCAT, PERRL, EOMi, OP clear, neck supple without masses PULM: mild upper airway wheeze, good air movement CV: RRR, no mgr, no JVD AB: BS+, soft, nontender, no hsm Ext: warm, no edema, no clubbing, no cyanosis Derm: no rash or skin breakdown Neuro: A&Ox4, CN II-XII intact, strength 5/5 in all 4 extremities         Assessment & Plan:   Cough I explained to Ms. Lana that the most likely reason for her cough is the ongoing post nasal drip from sinus congestion as well as tracheal/laryngeal irritation.  She does have a mild upper airway wheeze but I don't hear any in her lower airways so I doubt we are dealing with asthma.  Nevertheless we will get PFTs to rule that out.  I am happy that her recent Chest X-ray looked OK with the exception of a suggestion of hyperinflation.  Plan: -three days of voice rest and try to suppress cough, use tramadol, hard candies to soothe throat -tramadol 50mg  po q6h prn cough -PFT -see sinus congestion  Sinusitis Her ongoing sinusitis symptoms are causing the cough.  It sounds like some degree of allergic rhinitis but I worry about chronic infecious sinusitis.  Plan -ct sinus -add saline rinse -continue Dymista -add phenylephrine  -may need ENT referral    Updated Medication List Outpatient Encounter Prescriptions as of 07/02/2013  Medication Sig  . alendronate (FOSAMAX) 70 MG tablet Take 1 tablet (70 mg total) by mouth every 7 (seven) days. Take with a full glass of water on an empty stomach.  . Azelastine-Fluticasone (DYMISTA) 137-50 MCG/ACT SUSP Place 1 puff into both nostrils daily.  . calcium carbonate (OS-CAL) 600 MG TABS Take 1 tablet (600 mg total) by mouth 2 (two) times daily with a meal.  . cholecalciferol (VITAMIN D) 1000 UNITS tablet Take 1,000 Units by mouth daily.  . fexofenadine (ALLEGRA) 180 MG tablet Take 180 mg by mouth daily.  . simvastatin (ZOCOR) 40 MG tablet Take 1 tablet (40 mg total) by mouth every evening.  . Sodium Chloride-Sodium Bicarb (CLASSIC NETI POT SINUS [place] NA) Place into the nose. Bid

## 2013-07-09 ENCOUNTER — Ambulatory Visit: Payer: Self-pay | Admitting: Pulmonary Disease

## 2013-07-09 LAB — PULMONARY FUNCTION TEST

## 2013-07-16 ENCOUNTER — Encounter: Payer: Self-pay | Admitting: Pulmonary Disease

## 2013-07-16 ENCOUNTER — Telehealth: Payer: Self-pay

## 2013-07-16 NOTE — Telephone Encounter (Signed)
Message copied by Len Blalock on Tue Jul 16, 2013  5:20 PM ------      Message from: Juanito Doom      Created: Tue Jul 16, 2013  2:47 PM       A,            Please let her know that her CT sinus showed sinusitis.  She needs to be referred to [facility] for this.            Thanks,      Ruby Cola ------

## 2013-07-16 NOTE — Telephone Encounter (Signed)
lmtcb X1 to relay results to pt.  Have not placed referral to [facility] yet, want to speak to pt first BT:DVVO.

## 2013-07-17 ENCOUNTER — Telehealth: Payer: Self-pay

## 2013-07-17 DIAGNOSIS — J329 Chronic sinusitis, unspecified: Secondary | ICD-10-CM

## 2013-07-17 NOTE — Telephone Encounter (Signed)
Returned Ashley's call.  Please call cell # [phone].

## 2013-07-17 NOTE — Telephone Encounter (Signed)
Pt aware of results and recs.  Referral to [facility] sent.  Nothing further needed.

## 2013-07-17 NOTE — Telephone Encounter (Signed)
Pt aware of results and recs.  See other telephone note dated for today.

## 2013-07-17 NOTE — Telephone Encounter (Signed)
lmtcb x1 

## 2013-07-29 ENCOUNTER — Telehealth: Payer: Self-pay | Admitting: Pulmonary Disease

## 2013-07-29 NOTE — Telephone Encounter (Signed)
LMTCB

## 2013-07-29 NOTE — Telephone Encounter (Signed)
ATC- no answer and unable to leave a message-will need to try later. I see order placed but nothing looks to have been done with it. Will need to let patient know we are working on this with [facility] and then let [facility] know this needs to be done ASAP.  Thanks.

## 2013-07-30 ENCOUNTER — Telehealth: Payer: Self-pay | Admitting: Internal Medicine

## 2013-07-30 DIAGNOSIS — J329 Chronic sinusitis, unspecified: Secondary | ICD-10-CM

## 2013-07-30 NOTE — Telephone Encounter (Signed)
Called and spoke with patient and advised her that with her insurance and primary care physician listed on pt's card she would need to contact her primary care physician to make this appointment. We attempted to schedule appointment however, they would not allow [place] to schedule appointment due to patient needing a referral from primary care office. Advised patient to contact primary care physician and advise her that Dr. Lake Bells had suggested that she see [facility] and they should be able to see the referral and schedule appointment. Pt voiced understanding and stated that she would speak with primary care to arrange referral and appointment Catha Gosselin

## 2013-07-30 NOTE — Telephone Encounter (Signed)
Patient cam into office and stated that Dr. Lake Bells wanted patient to see ENT however his office could not make referral due to insurance purposes and stated primary would have to make referral. Please advise.

## 2013-07-31 ENCOUNTER — Ambulatory Visit: Payer: Medicare PPO | Admitting: Pulmonary Disease

## 2013-07-31 NOTE — Telephone Encounter (Signed)
Referral is in process as requested 

## 2013-07-31 NOTE — Telephone Encounter (Signed)
Patient notified

## 2013-08-05 ENCOUNTER — Encounter: Payer: Self-pay | Admitting: Pulmonary Disease

## 2013-09-25 ENCOUNTER — Ambulatory Visit (INDEPENDENT_AMBULATORY_CARE_PROVIDER_SITE_OTHER): Payer: Medicare PPO | Admitting: Internal Medicine

## 2013-09-25 ENCOUNTER — Encounter: Payer: Self-pay | Admitting: Internal Medicine

## 2013-09-25 VITALS — BP 106/67 | HR 70 | Temp 98.6°F | Resp 16 | Ht 67.0 in | Wt 187.0 lb

## 2013-09-25 DIAGNOSIS — E669 Obesity, unspecified: Secondary | ICD-10-CM

## 2013-09-25 DIAGNOSIS — E663 Overweight: Secondary | ICD-10-CM

## 2013-09-25 NOTE — Progress Notes (Signed)
Pre-visit discussion using our clinic review tool. No additional management support is needed unless otherwise documented below in the visit note.  

## 2013-09-25 NOTE — Progress Notes (Signed)
Patient ID: Jacqueline Kaiser, female   DOB: [birth year], 68 y.o.   MRN: [id number]   Patient Active Problem List   Diagnosis Date Noted  . Allergy 07/01/2013  . Osteopenia 05/19/2013  . Cough 05/09/2013  . Routine general medical examination at a health care facility 02/08/2012  . Overweight (BMI 25.0-29.9) 08/28/2011  . Weight gain, abnormal 08/28/2011  . Peripheral vertigo 08/28/2011  . Nonsustained ventricular tachycardia   . Hyperlipidemia     Subjective:  CC:   Chief Complaint  Patient presents with  . Follow-up    anxiety conference    HPI:   Jacqueline Kaiser is a 68 y.o. female who presents for 1) Concerns about  Her mother , also my patient.   Jacqueline Kaiser .  Patient has had to take over management of medications due to mother's short term memory deficits.  She also finds her mother sleeping whenever she visits her , regardless of the time.  Mother lives at [facility] independent living .    2) patient frustrated with her inability to lose weight despite following a healthy diet and exercising regularly using aerobics classes. She has gained 14 lbs over the last 2 years and has been able to lose only 3.    Past Medical History  Diagnosis Date  . Nonsustained ventricular tachycardia     by Holter,  noraml Stress test ECHO perpatient [name], [place])  . Hyperlipidemia     Past Surgical History  Procedure Laterality Date  . Abdominal hysterectomy    . Cesarean section         The following portions of the patient's history were reviewed and updated as appropriate: Allergies, current medications, and problem list.    Review of Systems:   Patient denies headache, fevers, malaise, unintentional weight loss, skin rash, eye pain, sinus congestion and sinus pain, sore throat, dysphagia,  hemoptysis , cough, dyspnea, wheezing, chest pain, palpitations, orthopnea, edema, abdominal pain, nausea, melena, diarrhea, constipation, flank pain, dysuria, hematuria, urinary   Frequency, nocturia, numbness, tingling, seizures,  Focal weakness, Loss of consciousness,  Tremor, insomnia, depression, anxiety, and suicidal ideation.     History   Social History  . Marital Status: Married    Spouse Name: N/A    Number of Children: N/A  . Years of Education: N/A   Occupational History  . Not on file.   Social History Main Topics  . Smoking status: Former Smoker -- 0.50 packs/day for 10 years    Types: Cigarettes    Quit date: 08/24/1971  . Smokeless tobacco: Never Used  . Alcohol Use: No  . Drug Use: No  . Sexual Activity: Not Currently   Other Topics Concern  . Not on file   Social History Narrative  . No narrative on file    Objective:  Filed Vitals:   09/25/13 1528  BP: 106/67  Pulse: 70  Temp: 98.6 F (37 C)  Resp: 16     General appearance: alert, cooperative and appears stated age Ears: normal TM's and external ear canals both ears Throat: lips, mucosa, and tongue normal; teeth and gums normal Neck: no adenopathy, no carotid bruit, supple, symmetrical, trachea midline and thyroid not enlarged, symmetric, no tenderness/mass/nodules Back: symmetric, no curvature. ROM normal. No CVA tenderness. Lungs: clear to auscultation bilaterally Heart: regular rate and rhythm, S1, S2 normal, no murmur, click, rub or gallop Abdomen: soft, non-tender; bowel sounds normal; no masses,  no organomegaly Pulses: 2+ and symmetric Skin: Skin color, texture, turgor normal.  No rashes or lesions Lymph nodes: Cervical, supraclavicular, and axillary nodes normal.  Assessment and Plan:  Overweight (BMI 25.0-29.9) Her BMI remains unchanged despite exercise. We discussed the nature and quality of her exercises well as of her diet. Usually what I find is that people are not exercising as vigorously as they should to achieve a sustained heart rate in the aerobic zone. I suggested that she consider joining a gym and using a personal trainer to help guide her efforts.    I also am advising her to submit a food diary of 3 typical days, back on the low GI diet using six smaller meals a day to stimulate her metabolism, and return for fasting labs and thyroid screen, .     A total of 25 minutes was spent with patient more than half of which was spent in counseling patient on diet and exercise caregiver concerns regarding her mother and coordination of care.   Updated Medication List Outpatient Encounter Prescriptions as of 09/25/2013  Medication Sig  . alendronate (FOSAMAX) 70 MG tablet Take 1 tablet (70 mg total) by mouth every 7 (seven) days. Take with a full glass of water on an empty stomach.  . Azelastine-Fluticasone (DYMISTA) 137-50 MCG/ACT SUSP Place 1 puff into both nostrils daily.  . calcium carbonate (OS-CAL) 600 MG TABS Take 1 tablet (600 mg total) by mouth 2 (two) times daily with a meal.  . cholecalciferol (VITAMIN D) 1000 UNITS tablet Take 1,000 Units by mouth daily.  . fexofenadine (ALLEGRA) 180 MG tablet Take 180 mg by mouth daily.  . simvastatin (ZOCOR) 40 MG tablet Take 1 tablet (40 mg total) by mouth every evening.  . Sodium Chloride-Sodium Bicarb (CLASSIC NETI POT SINUS [place] NA) Place into the nose. Bid  . traMADol (ULTRAM) 50 MG tablet Take 1 tablet (50 mg total) by mouth every 6 (six) hours as needed (for cough).     Orders Placed This Encounter  Procedures  . Comprehensive metabolic panel  . TSH  . Lipid panel    No Follow-up on file.

## 2013-09-27 NOTE — Assessment & Plan Note (Signed)
Her BMI remains unchanged despite exercise. We discussed the nature and quality of her exercises well as of her diet. Usually what I find is that people are not exercising as vigorously as they should to achieve a sustained heart rate in the aerobic zone. I suggested that she consider joining a gym and using a personal trainer to help guide her efforts.   I also am advising her to submit a food diary of 3 typical days, back on the low GI diet using six smaller meals a day to stimulate her metabolism, and return for fasting labs and thyroid screen, .

## 2013-09-30 ENCOUNTER — Other Ambulatory Visit (INDEPENDENT_AMBULATORY_CARE_PROVIDER_SITE_OTHER): Payer: Medicare PPO

## 2013-09-30 DIAGNOSIS — E669 Obesity, unspecified: Secondary | ICD-10-CM

## 2013-09-30 LAB — LIPID PANEL
CHOL/HDL RATIO: 3
CHOLESTEROL: 179 mg/dL (ref 0–200)
HDL: 69 mg/dL (ref >39.00)
LDL Cholesterol: 102 mg/dL — ABNORMAL HIGH (ref 0–99)
NonHDL: 110
Triglycerides: 38 mg/dL (ref 0.0–149.0)
VLDL: 7.6 mg/dL (ref 0.0–40.0)

## 2013-09-30 LAB — COMPREHENSIVE METABOLIC PANEL
ALBUMIN: 4.5 g/dL (ref 3.5–5.2)
ALT: 11 U/L (ref 0–35)
AST: 20 U/L (ref 0–37)
Alkaline Phosphatase: 53 U/L (ref 39–117)
BUN: 19 mg/dL (ref 6–23)
CALCIUM: 9.4 mg/dL (ref 8.4–10.5)
CHLORIDE: 104 meq/L (ref 96–112)
CO2: 28 mEq/L (ref 19–32)
Creatinine, Ser: 0.9 mg/dL (ref 0.4–1.2)
GFR: 69.34 mL/min (ref >60.00)
Glucose, Bld: 92 mg/dL (ref 70–99)
Potassium: 4.6 mEq/L (ref 3.5–5.1)
SODIUM: 139 meq/L (ref 135–145)
Total Bilirubin: 0.6 mg/dL (ref 0.2–1.2)
Total Protein: 7.1 g/dL (ref 6.0–8.3)

## 2013-09-30 LAB — TSH: TSH: 1.49 u[IU]/mL (ref 0.35–4.50)

## 2013-10-01 ENCOUNTER — Encounter: Payer: Self-pay | Admitting: Internal Medicine

## 2013-10-03 NOTE — Telephone Encounter (Signed)
Mailed unread message to pt  

## 2013-11-26 ENCOUNTER — Other Ambulatory Visit: Payer: Self-pay | Admitting: Internal Medicine

## 2013-12-13 ENCOUNTER — Encounter: Payer: Self-pay | Admitting: *Deleted

## 2014-01-15 ENCOUNTER — Other Ambulatory Visit: Payer: Self-pay | Admitting: *Deleted

## 2014-01-15 MED ORDER — SIMVASTATIN 40 MG PO TABS: 40.0000 mg | ORAL_TABLET | Freq: Every evening | ORAL | Status: DC

## 2014-01-15 NOTE — Progress Notes (Signed)
Patient called for medication refill.

## 2014-03-14 ENCOUNTER — Other Ambulatory Visit: Payer: Self-pay | Admitting: Internal Medicine

## 2014-07-25 ENCOUNTER — Other Ambulatory Visit: Payer: Self-pay | Admitting: Internal Medicine

## 2014-11-27 ENCOUNTER — Other Ambulatory Visit: Payer: Self-pay | Admitting: Internal Medicine

## 2014-11-27 DIAGNOSIS — Z1231 Encounter for screening mammogram for malignant neoplasm of breast: Secondary | ICD-10-CM

## 2014-12-02 ENCOUNTER — Ambulatory Visit
Admission: RE | Admit: 2014-12-02 | Discharge: 2014-12-02 | Disposition: A | Discharge status: Home / Self Care | Payer: Medicare PPO | Source: Ambulatory Visit | Attending: Internal Medicine | Admitting: Internal Medicine

## 2014-12-02 ENCOUNTER — Other Ambulatory Visit: Payer: Self-pay | Admitting: Internal Medicine

## 2014-12-02 DIAGNOSIS — Z1231 Encounter for screening mammogram for malignant neoplasm of breast: Secondary | ICD-10-CM

## 2014-12-03 ENCOUNTER — Ambulatory Visit: Payer: Medicare PPO

## 2014-12-12 ENCOUNTER — Telehealth: Payer: Self-pay | Admitting: *Deleted

## 2014-12-12 NOTE — Telephone Encounter (Signed)
Patient requested a medication refill on her bone density medication, and cholesterol medication -Thanks

## 2014-12-12 NOTE — Telephone Encounter (Signed)
Pt has not been seen in over a year, last refill advised pt that she would have to been seen prior to any refills.  Pt is scheduled to be seen on Monday, refills can be completed at that time.

## 2014-12-15 ENCOUNTER — Encounter: Payer: Self-pay | Admitting: Internal Medicine

## 2014-12-15 ENCOUNTER — Ambulatory Visit (INDEPENDENT_AMBULATORY_CARE_PROVIDER_SITE_OTHER): Payer: Medicare PPO | Admitting: Internal Medicine

## 2014-12-15 VITALS — BP 126/74 | HR 69 | Temp 97.8°F | Resp 14 | Ht 67.0 in | Wt 191.2 lb

## 2014-12-15 DIAGNOSIS — Z23 Encounter for immunization: Secondary | ICD-10-CM | POA: Diagnosis not present

## 2014-12-15 DIAGNOSIS — E663 Overweight: Secondary | ICD-10-CM | POA: Diagnosis not present

## 2014-12-15 DIAGNOSIS — E559 Vitamin D deficiency, unspecified: Secondary | ICD-10-CM

## 2014-12-15 DIAGNOSIS — E785 Hyperlipidemia, unspecified: Secondary | ICD-10-CM | POA: Diagnosis not present

## 2014-12-15 DIAGNOSIS — Z113 Encounter for screening for infections with a predominantly sexual mode of transmission: Secondary | ICD-10-CM | POA: Diagnosis not present

## 2014-12-15 DIAGNOSIS — R5383 Other fatigue: Secondary | ICD-10-CM

## 2014-12-15 DIAGNOSIS — M810 Age-related osteoporosis without current pathological fracture: Secondary | ICD-10-CM

## 2014-12-15 DIAGNOSIS — Z0000 Encounter for general adult medical examination without abnormal findings: Secondary | ICD-10-CM | POA: Diagnosis not present

## 2014-12-15 DIAGNOSIS — D492 Neoplasm of unspecified behavior of bone, soft tissue, and skin: Secondary | ICD-10-CM

## 2014-12-15 MED ORDER — DIAZEPAM 5 MG PO TABS: 5.0000 mg | ORAL_TABLET | Freq: Two times a day (BID) | ORAL | Status: DC | PRN

## 2014-12-15 MED ORDER — MECLIZINE HCL 12.5 MG PO TABS: 12.5000 mg | ORAL_TABLET | ORAL | Status: DC | PRN

## 2014-12-15 NOTE — Progress Notes (Signed)
Pre-visit discussion using our clinic review tool. No additional management support is needed unless otherwise documented below in the visit note.  

## 2014-12-15 NOTE — Patient Instructions (Addendum)
There are MANY diets that will work, but they need to be lifestyle changes accompanied by aerobic exercise  5 days a week to achieve and maintain your goal weight.   The  diet I discussed with you today is the 10 day Green Smoothie Cleansing /Detox Diet by Linden Dolin . available on [employer] for around $10.  This is not a low carb or a weight loss diet,  It is fundamentally a "cleansing" low fat diet that eliminates sugar, gluten, caffeine, alcohol and dairy for 10 days .  What you add back after the initial ten days is entirely up to  you!  You can expect to lose 5 to 10 lbs depending on how strict you are.   I suggest drinking 2 smoothies daily and keeping one chewable meal (but keep it simple, like baked fish and salad, rice or bok choy) .  You snack primarily on fresh  fruit, egg whites and judicious quantities of nuts. You can add vegetable based protein powder (nothing with whey , since whey is dairy) in it.  [employer] has a [employer], political party .  It does require some form of a nutrient extractor (Vita Mix, a electric juicer,  Or a Nutribullet Rx).  i have found that using frozen fruits is much more convenient and cost effective. You can even find plenty of organic fruit in the frozen fruit section of [employer].  Just thaw what you need for the following day the night before in the refrigerator (to avoid jamming up your juicer/blender)   Health Maintenance Adopting a healthy lifestyle and getting preventive care can go a long way to promote health and wellness. Talk with your health care provider about what schedule of regular examinations is right for you. This is a good chance for you to check in with your provider about disease prevention and staying healthy. In between checkups, there are plenty of things you can do on your own. Experts have done a lot of research about which lifestyle changes and preventive measures are most likely to keep you healthy. Ask your health care provider for more  information. WEIGHT AND DIET  Eat a healthy diet  Be sure to include plenty of vegetables, fruits, low-fat dairy products, and lean protein.  Do not eat a lot of foods high in solid fats, added sugars, or salt.  Get regular exercise. This is one of the most important things you can do for your health.  Most adults should exercise for at least 150 minutes each week. The exercise should increase your heart rate and make you sweat (moderate-intensity exercise).  Most adults should also do strengthening exercises at least twice a week. This is in addition to the moderate-intensity exercise.  Maintain a healthy weight  Body mass index (BMI) is a measurement that can be used to identify possible weight problems. It estimates body fat based on height and weight. Your health care provider can help determine your BMI and help you achieve or maintain a healthy weight.  For females 18 years of age and older:   A BMI below 18.5 is considered underweight.  A BMI of 18.5 to 24.9 is normal.  A BMI of 25 to 29.9 is considered overweight.  A BMI of 30 and above is considered obese.  Watch levels of cholesterol and blood lipids  You should start having your blood tested for lipids and cholesterol at 71 years of age, then have this test every 5 years.  You may need to have  your cholesterol levels checked more often if:  Your lipid or cholesterol levels are high.  You are older than 71 years of age.  You are at high risk for heart disease.  CANCER SCREENING   Lung Cancer  Lung cancer screening is recommended for adults 36-2 years old who are at high risk for lung cancer because of a history of smoking.  A yearly low-dose CT scan of the lungs is recommended for people who:  Currently smoke.  Have quit within the past 15 years.  Have at least a 30-pack-year history of smoking. A pack year is smoking an average of one pack of cigarettes a day for 1 year.  Yearly screening should  continue until it has been 15 years since you quit.  Yearly screening should stop if you develop a health problem that would prevent you from having lung cancer treatment.  Breast Cancer  Practice breast self-awareness. This means understanding how your breasts normally appear and feel.  It also means doing regular breast self-exams. Let your health care provider know about any changes, no matter how small.  If you are in your 20s or 30s, you should have a clinical breast exam (CBE) by a health care provider every 1-3 years as part of a regular health exam.  If you are 69 or older, have a CBE every year. Also consider having a breast X-ray (mammogram) every year.  If you have a family history of breast cancer, talk to your health care provider about genetic screening.  If you are at high risk for breast cancer, talk to your health care provider about having an MRI and a mammogram every year.  Breast cancer gene (BRCA) assessment is recommended for women who have family members with BRCA-related cancers. BRCA-related cancers include:  Breast.  Ovarian.  Tubal.  Peritoneal cancers.  Results of the assessment will determine the need for genetic counseling and BRCA1 and BRCA2 testing. Cervical Cancer Routine pelvic examinations to screen for cervical cancer are no longer recommended for nonpregnant women who are considered low risk for cancer of the pelvic organs (ovaries, uterus, and vagina) and who do not have symptoms. A pelvic examination may be necessary if you have symptoms including those associated with pelvic infections. Ask your health care provider if a screening pelvic exam is right for you.   The Pap test is the screening test for cervical cancer for women who are considered at risk.  If you had a hysterectomy for a problem that was not cancer or a condition that could lead to cancer, then you no longer need Pap tests.  If you are older than 65 years, and you have had  normal Pap tests for the past 10 years, you no longer need to have Pap tests.  If you have had past treatment for cervical cancer or a condition that could lead to cancer, you need Pap tests and screening for cancer for at least 20 years after your treatment.  If you no longer get a Pap test, assess your risk factors if they change (such as having a new sexual partner). This can affect whether you should start being screened again.  Some women have medical problems that increase their chance of getting cervical cancer. If this is the case for you, your health care provider may recommend more frequent screening and Pap tests.  The human papillomavirus (HPV) test is another test that may be used for cervical cancer screening. The HPV test looks for the virus  that can cause cell changes in the cervix. The cells collected during the Pap test can be tested for HPV.  The HPV test can be used to screen women 47 years of age and older. Getting tested for HPV can extend the interval between normal Pap tests from three to five years.  An HPV test also should be used to screen women of any age who have unclear Pap test results.  After 71 years of age, women should have HPV testing as often as Pap tests.  Colorectal Cancer  This type of cancer can be detected and often prevented.  Routine colorectal cancer screening usually begins at 71 years of age and continues through 71 years of age.  Your health care provider may recommend screening at an earlier age if you have risk factors for colon cancer.  Your health care provider may also recommend using home test kits to check for hidden blood in the stool.  A small camera at the end of a tube can be used to examine your colon directly (sigmoidoscopy or colonoscopy). This is done to check for the earliest forms of colorectal cancer.  Routine screening usually begins at age 32.  Direct examination of the colon should be repeated every 5-10 years through  71 years of age. However, you may need to be screened more often if early forms of precancerous polyps or small growths are found. Skin Cancer  Check your skin from head to toe regularly.  Tell your health care provider about any new moles or changes in moles, especially if there is a change in a mole's shape or color.  Also tell your health care provider if you have a mole that is larger than the size of a pencil eraser.  Always use sunscreen. Apply sunscreen liberally and repeatedly throughout the day.  Protect yourself by wearing long sleeves, pants, a wide-brimmed hat, and sunglasses whenever you are outside. HEART DISEASE, DIABETES, AND HIGH BLOOD PRESSURE   Have your blood pressure checked at least every 1-2 years. High blood pressure causes heart disease and increases the risk of stroke.  If you are between 21 years and 85 years old, ask your health care provider if you should take aspirin to prevent strokes.  Have regular diabetes screenings. This involves taking a blood sample to check your fasting blood sugar level.  If you are at a normal weight and have a low risk for diabetes, have this test once every three years after 71 years of age.  If you are overweight and have a high risk for diabetes, consider being tested at a younger age or more often. PREVENTING INFECTION  Hepatitis B  If you have a higher risk for hepatitis B, you should be screened for this virus. You are considered at high risk for hepatitis B if:  You were born in a country where hepatitis B is common. Ask your health care provider which countries are considered high risk.  Your parents were born in a high-risk country, and you have not been immunized against hepatitis B (hepatitis B vaccine).  You have HIV or AIDS.  You use needles to inject street drugs.  You live with someone who has hepatitis B.  You have had sex with someone who has hepatitis B.  You get hemodialysis treatment.  You take  certain medicines for conditions, including cancer, organ transplantation, and autoimmune conditions. Hepatitis C  Blood testing is recommended for:  Everyone born from [birth year] through 1967.  Anyone with known  risk factors for hepatitis C. Sexually transmitted infections (STIs)  You should be screened for sexually transmitted infections (STIs) including gonorrhea and chlamydia if:  You are sexually active and are younger than 71 years of age.  You are older than 71 years of age and your health care provider tells you that you are at risk for this type of infection.  Your sexual activity has changed since you were last screened and you are at an increased risk for chlamydia or gonorrhea. Ask your health care provider if you are at risk.  If you do not have HIV, but are at risk, it may be recommended that you take a prescription medicine daily to prevent HIV infection. This is called pre-exposure prophylaxis (PrEP). You are considered at risk if:  You are sexually active and do not regularly use condoms or know the HIV status of your partner(s).  You take drugs by injection.  You are sexually active with a partner who has HIV. Talk with your health care provider about whether you are at high risk of being infected with HIV. If you choose to begin PrEP, you should first be tested for HIV. You should then be tested every 3 months for as long as you are taking PrEP.  PREGNANCY   If you are premenopausal and you may become pregnant, ask your health care provider about preconception counseling.  If you may become pregnant, take 400 to 800 micrograms (mcg) of folic acid every day.  If you want to prevent pregnancy, talk to your health care provider about birth control (contraception). OSTEOPOROSIS AND MENOPAUSE   Osteoporosis is a disease in which the bones lose minerals and strength with aging. This can result in serious bone fractures. Your risk for osteoporosis can be identified using a  bone density scan.  If you are 63 years of age or older, or if you are at risk for osteoporosis and fractures, ask your health care provider if you should be screened.  Ask your health care provider whether you should take a calcium or vitamin D supplement to lower your risk for osteoporosis.  Menopause may have certain physical symptoms and risks.  Hormone replacement therapy may reduce some of these symptoms and risks. Talk to your health care provider about whether hormone replacement therapy is right for you.  HOME CARE INSTRUCTIONS   Schedule regular health, dental, and eye exams.  Stay current with your immunizations.   Do not use any tobacco products including cigarettes, chewing tobacco, or electronic cigarettes.  If you are pregnant, do not drink alcohol.  If you are breastfeeding, limit how much and how often you drink alcohol.  Limit alcohol intake to no more than 1 drink per day for nonpregnant women. One drink equals 12 ounces of beer, 5 ounces of wine, or 1 ounces of hard liquor.  Do not use street drugs.  Do not share needles.  Ask your health care provider for help if you need support or information about quitting drugs.  Tell your health care provider if you often feel depressed.  Tell your health care provider if you have ever been abused or do not feel safe at home. Document Released: 10/04/2010 Document Revised: 08/05/2013 Document Reviewed: 02/20/2013 [facility] Patient Information 2015 [place], [place]. This information is not intended to replace advice given to you by your health care provider. Make sure you discuss any questions you have with your health care provider.

## 2014-12-16 ENCOUNTER — Encounter: Payer: Self-pay | Admitting: Internal Medicine

## 2014-12-16 LAB — CBC WITH DIFFERENTIAL/PLATELET
Basophils Absolute: 0 10*3/uL (ref 0.0–0.1)
Basophils Relative: 0.7 % (ref 0.0–3.0)
EOS PCT: 2 % (ref 0.0–5.0)
Eosinophils Absolute: 0.1 10*3/uL (ref 0.0–0.7)
HCT: 41.8 % (ref 36.0–46.0)
Hemoglobin: 13.9 g/dL (ref 12.0–15.0)
LYMPHS ABS: 1.6 10*3/uL (ref 0.7–4.0)
Lymphocytes Relative: 27.3 % (ref 12.0–46.0)
MCHC: 33.4 g/dL (ref 30.0–36.0)
MCV: 92.2 fl (ref 78.0–100.0)
Monocytes Absolute: 0.4 10*3/uL (ref 0.1–1.0)
Monocytes Relative: 7.3 % (ref 3.0–12.0)
NEUTROS ABS: 3.7 10*3/uL (ref 1.4–7.7)
NEUTROS PCT: 62.7 % (ref 43.0–77.0)
PLATELETS: 196 10*3/uL (ref 150.0–400.0)
RBC: 4.53 Mil/uL (ref 3.87–5.11)
RDW: 12.5 % (ref 11.5–15.5)
WBC: 5.9 10*3/uL (ref 4.0–10.5)

## 2014-12-16 LAB — COMPREHENSIVE METABOLIC PANEL
ALBUMIN: 4.3 g/dL (ref 3.5–5.2)
ALK PHOS: 58 U/L (ref 39–117)
ALT: 16 U/L (ref 0–35)
AST: 21 U/L (ref 0–37)
BILIRUBIN TOTAL: 0.4 mg/dL (ref 0.2–1.2)
BUN: 23 mg/dL (ref 6–23)
CO2: 26 mEq/L (ref 19–32)
Calcium: 9.5 mg/dL (ref 8.4–10.5)
Chloride: 104 mEq/L (ref 96–112)
Creatinine, Ser: 0.77 mg/dL (ref 0.40–1.20)
GFR: 78.5 mL/min (ref >60.00)
GLUCOSE: 94 mg/dL (ref 70–99)
Potassium: 4.3 mEq/L (ref 3.5–5.1)
SODIUM: 141 meq/L (ref 135–145)
TOTAL PROTEIN: 6.9 g/dL (ref 6.0–8.3)

## 2014-12-16 LAB — LDL CHOLESTEROL, DIRECT: LDL DIRECT: 109 mg/dL

## 2014-12-16 LAB — HIV ANTIBODY (ROUTINE TESTING W REFLEX): HIV 1&2 Ab, 4th Generation: NONREACTIVE

## 2014-12-16 LAB — VITAMIN D 25 HYDROXY (VIT D DEFICIENCY, FRACTURES): VITD: 29.54 ng/mL — ABNORMAL LOW (ref 30.00–100.00)

## 2014-12-16 LAB — HEPATITIS C ANTIBODY: HCV Ab: NEGATIVE

## 2014-12-16 LAB — TSH: TSH: 2.22 u[IU]/mL (ref 0.35–4.50)

## 2014-12-17 ENCOUNTER — Other Ambulatory Visit: Payer: Self-pay | Admitting: *Deleted

## 2014-12-17 MED ORDER — MECLIZINE HCL 12.5 MG PO TABS: 12.5000 mg | ORAL_TABLET | Freq: Two times a day (BID) | ORAL | Status: DC | PRN

## 2014-12-17 NOTE — Assessment & Plan Note (Signed)

## 2014-12-17 NOTE — Assessment & Plan Note (Addendum)
I have addressed her weight gain and BMI borderline for obesity,  and recommended a low glycemic index diet utilizing smaller more frequent meals to increase metabolism.  I have also recommended that patient start exercising with a goal of 30 minutes of aerobic exercise a minimum of 5 days per week. Screening for lipid disorders, thyroid and diabetes to be done today.  Lab Results  Component Value Date   TSH 2.22 12/15/2014   No results found for: HGBA1C

## 2014-12-17 NOTE — Progress Notes (Signed)
Patient ID: Jacqueline Kaiser, female    DOB: 01/17/1946  Age: 69 y.o. MRN: [id number]  The patient is here for annual Medicare wellness examination and management of other chronic and acute problems.   The risk factors are reflected in the social history.  The roster of all physicians providing medical care to patient - is listed in the Snapshot section of the chart.  Activities of daily living:  The patient is 100% independent in all ADLs: dressing, toileting, feeding as well as independent mobility  Home safety : The patient has smoke detectors in the home. They wear seatbelts.  There are no firearms at home. There is no violence in the home.   There is no risks for hepatitis, STDs or HIV. There is no   history of blood transfusion. They have no travel history to infectious disease endemic areas of the world.  The patient has seen their dentist in the last six month. They have seen their eye doctor in the last year. They admit to slight hearing difficulty with regard to whispered voices and some television programs.  They have deferred audiologic testing in the last year.  They do not  have excessive sun exposure. Discussed the need for sun protection: hats, long sleeves and use of sunscreen if there is significant sun exposure.   Diet: the importance of a healthy diet is discussed. They do have a healthy diet.  The benefits of regular aerobic exercise were discussed. She walks 4 times per week ,  20 minutes.   Depression screen: there are no signs or vegative symptoms of depression- irritability, change in appetite, anhedonia, sadness/tearfullness.  Cognitive assessment: the patient manages all their financial and personal affairs and is actively engaged. They could relate day,date,year and events; recalled 2/3 objects at 3 minutes; performed clock-face test normally.  The following portions of the patient's history were reviewed and updated as appropriate: allergies, current medications, past  family history, past medical history,  past surgical history, past social history  and problem list.  Visual acuity was not assessed per patient preference since she has regular follow up with her ophthalmologist. Hearing and body mass index were assessed and reviewed.   During the course of the visit the patient was educated and counseled about appropriate screening and preventive services including : fall prevention , diabetes screening, nutrition counseling, colorectal cancer screening, and recommended immunizations.    CC: The primary encounter diagnosis was Skin neoplasm. Diagnoses of Osteoporosis, Screen for STD (sexually transmitted disease), Other fatigue, Vitamin D deficiency, Hyperlipidemia, Overweight (BMI 25.0-29.9), and Medicare annual wellness visit, subsequent were also pertinent to this visit.  History Jacqueline Kaiser has a past medical history of Nonsustained ventricular tachycardia; Hyperlipidemia; and Osteoporosis.   She has past surgical history that includes Abdominal hysterectomy; Cesarean section; and Breast biopsy (Right).   Her family history includes Cancer (age of onset: 43) in her father; Heart disease in her mother.She reports that she quit smoking about 43 years ago. Her smoking use included Cigarettes. She has a 5 pack-year smoking history. She has never used smokeless tobacco. She reports that she drinks alcohol. She reports that she does not use illicit drugs.  Outpatient Prescriptions Prior to Visit  Medication Sig Dispense Refill  . alendronate (FOSAMAX) 70 MG tablet TAKE 1 TABLET EVERY WEEK, (7 DAYS) TAKE WITH A FULL GLASS OF WATER ON AN EMPTY STOMACH (SEE PACKAGE FOR ADDL INSTRUCTIONS) 12 tablet 3  . calcium carbonate (OS-CAL) 600 MG TABS Take 1 tablet (600 mg  total) by mouth 2 (two) times daily with a meal. 180 tablet 0  . cholecalciferol (VITAMIN D) 1000 UNITS tablet Take 1,000 Units by mouth daily.    . simvastatin (ZOCOR) 40 MG tablet TAKE 1 TABLET EVERY EVENING 30  tablet 0  . Azelastine-Fluticasone (DYMISTA) 137-50 MCG/ACT SUSP Place 1 puff into both nostrils daily.    . fexofenadine (ALLEGRA) 180 MG tablet Take 180 mg by mouth daily.    . naproxen sodium (ANAPROX) 220 MG tablet Take 220 mg by mouth once.    . Sodium Chloride-Sodium Bicarb (CLASSIC NETI POT SINUS [place] NA) Place into the nose. Bid    . meclizine (ANTIVERT) 12.5 MG tablet Take 12.5 mg by mouth as needed for dizziness.    . traMADol (ULTRAM) 50 MG tablet Take 1 tablet (50 mg total) by mouth every 6 (six) hours as needed (for cough). (Patient not taking: Reported on 12/15/2014) 40 tablet 0   No facility-administered medications prior to visit.    Review of Systems   Patient denies headache, fevers, malaise, unintentional weight loss, skin rash, eye pain, sinus congestion and sinus pain, sore throat, dysphagia,  hemoptysis , cough, dyspnea, wheezing, chest pain, palpitations, orthopnea, edema, abdominal pain, nausea, melena, diarrhea, constipation, flank pain, dysuria, hematuria, urinary  Frequency, nocturia, numbness, tingling, seizures,  Focal weakness, Loss of consciousness,  Tremor, insomnia, depression, anxiety, and suicidal ideation.      Objective:  BP 126/74 mmHg  Pulse 69  Temp(Src) 97.8 F (36.6 C) (Oral)  Resp 14  Ht 5\' 7"  (1.702 m)  Wt 191 lb 4 oz (86.75 kg)  BMI 29.95 kg/m2  SpO2 97%  Physical Exam   General appearance: alert, cooperative and appears stated age Head: Normocephalic, without obvious abnormality, atraumatic Eyes: conjunctivae/corneas clear. PERRL, EOM's intact. Fundi benign. Ears: normal TM's and external ear canals both ears Nose: Nares normal. Septum midline. Mucosa normal. No drainage or sinus tenderness. Throat: lips, mucosa, and tongue normal; teeth and gums normal Neck: no adenopathy, no carotid bruit, no JVD, supple, symmetrical, trachea midline and thyroid not enlarged, symmetric, no tenderness/mass/nodules Lungs: clear to auscultation  bilaterally Breasts: normal appearance, no masses or tenderness Heart: regular rate and rhythm, S1, S2 normal, no murmur, click, rub or gallop Abdomen: soft, non-tender; bowel sounds normal; no masses,  no organomegaly Extremities: extremities normal, atraumatic, no cyanosis or edema Pulses: 2+ and symmetric Skin: Skin color, texture, turgor normal. No rashes or lesions Neurologic: Alert and oriented X 3, normal strength and tone. Normal symmetric reflexes. Normal coordination and gait.     Assessment & Plan:   Problem List Items Addressed This Visit      Unprioritized   Overweight (BMI 25.0-29.9)    I have addressed her weight gain and BMI borderline for obesity,  and recommended a low glycemic index diet utilizing smaller more frequent meals to increase metabolism.  I have also recommended that patient start exercising with a goal of 30 minutes of aerobic exercise a minimum of 5 days per week. Screening for lipid disorders, thyroid and diabetes to be done today.  Lab Results  Component Value Date   TSH 2.22 12/15/2014   No results found for: HGBA1C       Hyperlipidemia    Managed with simvastatin.  No side effects.  Direct LDL is 109.  Refills given.  Lab Results  Component Value Date   CHOL 179 09/30/2013   HDL 69.00 09/30/2013   LDLCALC 102* 09/30/2013   LDLDIRECT 109.0 12/15/2014  TRIG 38.0 09/30/2013   CHOLHDL 3 09/30/2013   Lab Results  Component Value Date   ALT 16 12/15/2014   AST 21 12/15/2014   ALKPHOS 58 12/15/2014   BILITOT 0.4 12/15/2014         Relevant Orders   LDL cholesterol, direct (Completed)   Medicare annual wellness visit, subsequent    Annual Medicare wellness  exam was done as well as a comprehensive physical exam and management of acute and chronic conditions .  During the course of the visit the patient was educated and counseled about appropriate screening and preventive services including : fall prevention , diabetes screening,  nutrition counseling, colorectal cancer screening, and recommended immunizations.  Printed recommendations for health maintenance screenings was given.        Other Visit Diagnoses    Skin neoplasm    -  Primary    Relevant Orders    Ambulatory referral to Dermatology    Osteoporosis        Relevant Orders    DG Bone Density    Screen for STD (sexually transmitted disease)        Relevant Orders    Hepatitis C antibody (Completed)    HIV antibody (Completed)    Other fatigue        Relevant Orders    CBC with Differential/Platelet (Completed)    Comprehensive metabolic panel (Completed)    TSH (Completed)    Vitamin D deficiency        Relevant Orders    Vit D  25 hydroxy (rtn osteoporosis monitoring) (Completed)       I have discontinued Ms. O'Brien's traMADol and meclizine. I am also having her start on diazepam. Additionally, I am having her maintain her cholecalciferol, calcium carbonate, fexofenadine, Azelastine-Fluticasone, Sodium Chloride-Sodium Bicarb (CLASSIC NETI POT SINUS [place] NA), alendronate, naproxen sodium, and simvastatin.  Meds ordered this encounter  Medications  . DISCONTD: meclizine (ANTIVERT) 12.5 MG tablet    Sig: Take 1 tablet (12.5 mg total) by mouth as needed for dizziness.    Dispense:  30 tablet    Refill:  3  . diazepam (VALIUM) 5 MG tablet    Sig: Take 1 tablet (5 mg total) by mouth every 12 (twelve) hours as needed for anxiety.    Dispense:  30 tablet    Refill:  1    Medications Discontinued During This Encounter  Medication Reason  . traMADol (ULTRAM) 50 MG tablet Patient Preference  . meclizine (ANTIVERT) 12.5 MG tablet Reorder    Follow-up: Return in about 6 months (around 06/14/2015).   Crecencio Mc, MD

## 2014-12-17 NOTE — Assessment & Plan Note (Signed)
Managed with simvastatin.  No side effects.  Direct LDL is 109.  Refills given.  Lab Results  Component Value Date   CHOL 179 09/30/2013   HDL 69.00 09/30/2013   LDLCALC 102* 09/30/2013   LDLDIRECT 109.0 12/15/2014   TRIG 38.0 09/30/2013   CHOLHDL 3 09/30/2013   Lab Results  Component Value Date   ALT 16 12/15/2014   AST 21 12/15/2014   ALKPHOS 58 12/15/2014   BILITOT 0.4 12/15/2014

## 2014-12-18 ENCOUNTER — Encounter: Payer: Self-pay | Admitting: Internal Medicine

## 2014-12-23 ENCOUNTER — Other Ambulatory Visit: Payer: Self-pay | Admitting: *Deleted

## 2014-12-23 MED ORDER — MECLIZINE HCL 12.5 MG PO TABS: 12.5000 mg | ORAL_TABLET | Freq: Four times a day (QID) | ORAL | Status: DC | PRN

## 2014-12-30 ENCOUNTER — Other Ambulatory Visit: Payer: Self-pay | Admitting: Internal Medicine

## 2015-02-25 ENCOUNTER — Other Ambulatory Visit: Payer: Self-pay | Admitting: Internal Medicine

## 2015-06-08 ENCOUNTER — Telehealth: Payer: Self-pay | Admitting: Internal Medicine

## 2015-06-08 NOTE — Telephone Encounter (Signed)
Bone Density scores received, she has osteopenia,  Moderate, unchanged from 2010.  If she has been taking fosomax for over 10 years   Needs to stop  Medication.  Otherwise,   Would repeat in 2 years and consider therapy then if there is a significant change. Continue calcium, vitamin d and weight bearing exercise on a regular basis.

## 2015-06-09 NOTE — Telephone Encounter (Signed)
Notified patient of results, and patient stated she does not know exact start date of Fosamax but record shows 2013.

## 2015-06-18 ENCOUNTER — Telehealth: Payer: Self-pay | Admitting: Internal Medicine

## 2015-06-22 ENCOUNTER — Encounter: Payer: Self-pay | Admitting: Internal Medicine

## 2015-10-13 ENCOUNTER — Telehealth: Payer: Self-pay | Admitting: Internal Medicine

## 2015-10-13 NOTE — Telephone Encounter (Signed)
Attempted to reach the patient, left a VM to return my call.  

## 2015-10-13 NOTE — Telephone Encounter (Signed)
Pt wanted to know what she can take over the counter for her back pain until  Her appt tomorrow.. Pt said that her chiropractor said that she needed prednisone.. Please advise pt

## 2015-10-14 ENCOUNTER — Encounter: Payer: Self-pay | Admitting: Family

## 2015-10-14 ENCOUNTER — Ambulatory Visit (INDEPENDENT_AMBULATORY_CARE_PROVIDER_SITE_OTHER): Payer: Medicare PPO | Admitting: Family

## 2015-10-14 VITALS — BP 122/68 | HR 71 | Temp 97.8°F | Ht 67.0 in | Wt 190.6 lb

## 2015-10-14 DIAGNOSIS — M545 Low back pain, unspecified: Secondary | ICD-10-CM

## 2015-10-14 MED ORDER — PREDNISONE 10 MG PO TABS: ORAL_TABLET | ORAL | Status: DC

## 2015-10-14 MED ORDER — DICLOFENAC SODIUM 1 % TD GEL: 4.0000 g | Freq: Four times a day (QID) | TRANSDERMAL | Status: DC

## 2015-10-14 NOTE — Progress Notes (Signed)
Pre visit review using our clinic review tool, if applicable. No additional management support is needed unless otherwise documented below in the visit note. 

## 2015-10-14 NOTE — Patient Instructions (Addendum)
Will place order for Physical Therapy if initial medications/excercise doesn't work.   If there is no improvement in your symptoms, or if there is any worsening of symptoms, or if you have any additional concerns, please return for re-evaluation; or, if we are closed, consider going to the Emergency Room for evaluation if symptoms urgent.  Low Back Sprain With Rehab A sprain is an injury in which a ligament is torn. The ligaments of the lower back are vulnerable to sprains. However, they are strong and require great force to be injured. These ligaments are important for stabilizing the spinal column. Sprains are classified into three categories. Grade 1 sprains cause pain, but the tendon is not lengthened. Grade 2 sprains include a lengthened ligament, due to the ligament being stretched or partially ruptured. With grade 2 sprains there is still function, although the function may be decreased. Grade 3 sprains involve a complete tear of the tendon or muscle, and function is usually impaired. SYMPTOMS   Severe pain in the lower back.  Sometimes, a feeling of a "pop," "snap," or tear, at the time of injury.  Tenderness and sometimes swelling at the injury site.  Uncommonly, bruising (contusion) within 48 hours of injury.  Muscle spasms in the back. CAUSES  Low back sprains occur when a force is placed on the ligaments that is greater than they can handle. Common causes of injury include:  Performing a stressful act while off-balance.  Repetitive stressful activities that involve movement of the lower back.  Direct hit (trauma) to the lower back. RISK INCREASES WITH:  Contact sports (football, wrestling).  Collisions (major skiing accidents).  Sports that require throwing or lifting (baseball, weightlifting).  Sports involving twisting of the spine (gymnastics, diving, tennis, golf).  Poor strength and flexibility.  Inadequate protection.  Previous back injury or surgery  (especially fusion). PREVENTION  Wear properly fitted and padded protective equipment.  Warm up and stretch properly before activity.  Allow for adequate recovery between workouts.  Maintain physical fitness:  Strength, flexibility, and endurance.  Cardiovascular fitness.  Maintain a healthy body weight. PROGNOSIS  If treated properly, low back sprains usually heal with non-surgical treatment. The length of time for healing depends on the severity of the injury.  RELATED COMPLICATIONS   Recurring symptoms, resulting in a chronic problem.  Chronic inflammation and pain in the low back.  Delayed healing or resolution of symptoms, especially if activity is resumed too soon.  Prolonged impairment.  Unstable or arthritic joints of the low back. TREATMENT  Treatment first involves the use of ice and medicine, to reduce pain and inflammation. The use of strengthening and stretching exercises may help reduce pain with activity. These exercises may be performed at home or with a therapist. Severe injuries may require referral to a therapist for further evaluation and treatment, such as ultrasound. Your caregiver may advise that you wear a back brace or corset, to help reduce pain and discomfort. Often, prolonged bed rest results in greater harm then benefit. Corticosteroid injections may be recommended. However, these should be reserved for the most serious cases. It is important to avoid using your back when lifting objects. At night, sleep on your back on a firm mattress, with a pillow placed under your knees. If non-surgical treatment is unsuccessful, surgery may be needed.  MEDICATION   If pain medicine is needed, nonsteroidal anti-inflammatory medicines (aspirin and ibuprofen), or other minor pain relievers (acetaminophen), are often advised.  Do not take pain medicine for 7  days before surgery.  Prescription pain relievers may be given, if your caregiver thinks they are needed. Use  only as directed and only as much as you need.  Ointments applied to the skin may be helpful.  Corticosteroid injections may be given by your caregiver. These injections should be reserved for the most serious cases, because they may only be given a certain number of times. HEAT AND COLD  Cold treatment (icing) should be applied for 10 to 15 minutes every 2 to 3 hours for inflammation and pain, and immediately after activity that aggravates your symptoms. Use ice packs or an ice massage.  Heat treatment may be used before performing stretching and strengthening activities prescribed by your caregiver, physical therapist, or athletic trainer. Use a heat pack or a warm water soak. SEEK MEDICAL CARE IF:   Symptoms get worse or do not improve in 2 to 4 weeks, despite treatment.  You develop numbness or weakness in either leg.  You lose bowel or bladder function.  Any of the following occur after surgery: fever, increased pain, swelling, redness, drainage of fluids, or bleeding in the affected area.  New, unexplained symptoms develop. (Drugs used in treatment may produce side effects.) EXERCISES  RANGE OF MOTION (ROM) AND STRETCHING EXERCISES - Low Back Sprain Most people with lower back pain will find that their symptoms get worse with excessive bending forward (flexion) or arching at the lower back (extension). The exercises that will help resolve your symptoms will focus on the opposite motion.  Your physician, physical therapist or athletic trainer will help you determine which exercises will be most helpful to resolve your lower back pain. Do not complete any exercises without first consulting with your caregiver. Discontinue any exercises which make your symptoms worse, until you speak to your caregiver. If you have pain, numbness or tingling which travels down into your buttocks, leg or foot, the goal of the therapy is for these symptoms to move closer to your back and eventually resolve.  Sometimes, these leg symptoms will get better, but your lower back pain may worsen. This is often an indication of progress in your rehabilitation. Be very alert to any changes in your symptoms and the activities in which you participated in the 24 hours prior to the change. Sharing this information with your caregiver will allow him or her to most efficiently treat your condition. These exercises may help you when beginning to rehabilitate your injury. Your symptoms may resolve with or without further involvement from your physician, physical therapist or athletic trainer. While completing these exercises, remember:   Restoring tissue flexibility helps normal motion to return to the joints. This allows healthier, less painful movement and activity.  An effective stretch should be held for at least 30 seconds.  A stretch should never be painful. You should only feel a gentle lengthening or release in the stretched tissue. FLEXION RANGE OF MOTION AND STRETCHING EXERCISES: STRETCH - Flexion, Single Knee to Chest   Lie on a firm bed or floor with both legs extended in front of you.  Keeping one leg in contact with the floor, bring your opposite knee to your chest. Hold your leg in place by either grabbing behind your thigh or at your knee.  Pull until you feel a gentle stretch in your low back. Hold __________ seconds.  Slowly release your grasp and repeat the exercise with the opposite side. Repeat __________ times. Complete this exercise __________ times per day.  STRETCH - Flexion, Double  Knee to Chest  Lie on a firm bed or floor with both legs extended in front of you.  Keeping one leg in contact with the floor, bring your opposite knee to your chest.  Tense your stomach muscles to support your back and then lift your other knee to your chest. Hold your legs in place by either grabbing behind your thighs or at your knees.  Pull both knees toward your chest until you feel a gentle stretch  in your low back. Hold __________ seconds.  Tense your stomach muscles and slowly return one leg at a time to the floor. Repeat __________ times. Complete this exercise __________ times per day.  STRETCH - Low Trunk Rotation  Lie on a firm bed or floor. Keeping your legs in front of you, bend your knees so they are both pointed toward the ceiling and your feet are flat on the floor.  Extend your arms out to the side. This will stabilize your upper body by keeping your shoulders in contact with the floor.  Gently and slowly drop both knees together to one side until you feel a gentle stretch in your low back. Hold for __________ seconds.  Tense your stomach muscles to support your lower back as you bring your knees back to the starting position. Repeat the exercise to the other side. Repeat __________ times. Complete this exercise __________ times per day  EXTENSION RANGE OF MOTION AND FLEXIBILITY EXERCISES: STRETCH - Extension, Prone on Elbows   Lie on your stomach on the floor, a bed will be too soft. Place your palms about shoulder width apart and at the height of your head.  Place your elbows under your shoulders. If this is too painful, stack pillows under your chest.  Allow your body to relax so that your hips drop lower and make contact more completely with the floor.  Hold this position for __________ seconds.  Slowly return to lying flat on the floor. Repeat __________ times. Complete this exercise __________ times per day.  RANGE OF MOTION - Extension, Prone Press Ups  Lie on your stomach on the floor, a bed will be too soft. Place your palms about shoulder width apart and at the height of your head.  Keeping your back as relaxed as possible, slowly straighten your elbows while keeping your hips on the floor. You may adjust the placement of your hands to maximize your comfort. As you gain motion, your hands will come more underneath your shoulders.  Hold this position  __________ seconds.  Slowly return to lying flat on the floor. Repeat __________ times. Complete this exercise __________ times per day.  RANGE OF MOTION- Quadruped, Neutral Spine   Assume a hands and knees position on a firm surface. Keep your hands under your shoulders and your knees under your hips. You may place padding under your knees for comfort.  Drop your head and point your tailbone toward the ground below you. This will round out your lower back like an angry cat. Hold this position for __________ seconds.  Slowly lift your head and release your tail bone so that your back sags into a large arch, like an old horse.  Hold this position for __________ seconds.  Repeat this until you feel limber in your low back.  Now, find your "sweet spot." This will be the most comfortable position somewhere between the two previous positions. This is your neutral spine. Once you have found this position, tense your stomach muscles to support your  low back.  Hold this position for __________ seconds. Repeat __________ times. Complete this exercise __________ times per day.  STRENGTHENING EXERCISES - Low Back Sprain These exercises may help you when beginning to rehabilitate your injury. These exercises should be done near your "sweet spot." This is the neutral, low-back arch, somewhere between fully rounded and fully arched, that is your least painful position. When performed in this safe range of motion, these exercises can be used for people who have either a flexion or extension based injury. These exercises may resolve your symptoms with or without further involvement from your physician, physical therapist or athletic trainer. While completing these exercises, remember:   Muscles can gain both the endurance and the strength needed for everyday activities through controlled exercises.  Complete these exercises as instructed by your physician, physical therapist or athletic trainer. Increase  the resistance and repetitions only as guided.  You may experience muscle soreness or fatigue, but the pain or discomfort you are trying to eliminate should never worsen during these exercises. If this pain does worsen, stop and make certain you are following the directions exactly. If the pain is still present after adjustments, discontinue the exercise until you can discuss the trouble with your caregiver. STRENGTHENING - Deep Abdominals, Pelvic Tilt   Lie on a firm bed or floor. Keeping your legs in front of you, bend your knees so they are both pointed toward the ceiling and your feet are flat on the floor.  Tense your lower abdominal muscles to press your low back into the floor. This motion will rotate your pelvis so that your tail bone is scooping upwards rather than pointing at your feet or into the floor. With a gentle tension and even breathing, hold this position for __________ seconds. Repeat __________ times. Complete this exercise __________ times per day.  STRENGTHENING - Abdominals, Crunches   Lie on a firm bed or floor. Keeping your legs in front of you, bend your knees so they are both pointed toward the ceiling and your feet are flat on the floor. Cross your arms over your chest.  Slightly tip your chin down without bending your neck.  Tense your abdominals and slowly lift your trunk high enough to just clear your shoulder blades. Lifting higher can put excessive stress on the lower back and does not further strengthen your abdominal muscles.  Control your return to the starting position. Repeat __________ times. Complete this exercise __________ times per day.  STRENGTHENING - Quadruped, Opposite UE/LE Lift   Assume a hands and knees position on a firm surface. Keep your hands under your shoulders and your knees under your hips. You may place padding under your knees for comfort.  Find your neutral spine and gently tense your abdominal muscles so that you can maintain this  position. Your shoulders and hips should form a rectangle that is parallel with the floor and is not twisted.  Keeping your trunk steady, lift your right hand no higher than your shoulder and then your left leg no higher than your hip. Make sure you are not holding your breath. Hold this position for __________ seconds.  Continuing to keep your abdominal muscles tense and your back steady, slowly return to your starting position. Repeat with the opposite arm and leg. Repeat __________ times. Complete this exercise __________ times per day.  STRENGTHENING - Abdominals and Quadriceps, Straight Leg Raise   Lie on a firm bed or floor with both legs extended in front of you.  Keeping one leg in contact with the floor, bend the other knee so that your foot can rest flat on the floor.  Find your neutral spine, and tense your abdominal muscles to maintain your spinal position throughout the exercise.  Slowly lift your straight leg off the floor about 6 inches for a count of 15, making sure to not hold your breath.  Still keeping your neutral spine, slowly lower your leg all the way to the floor. Repeat this exercise with each leg __________ times. Complete this exercise __________ times per day. POSTURE AND BODY MECHANICS CONSIDERATIONS - Low Back Sprain Keeping correct posture when sitting, standing or completing your activities will reduce the stress put on different body tissues, allowing injured tissues a chance to heal and limiting painful experiences. The following are general guidelines for improved posture. Your physician or physical therapist will provide you with any instructions specific to your needs. While reading these guidelines, remember:  The exercises prescribed by your provider will help you have the flexibility and strength to maintain correct postures.  The correct posture provides the best environment for your joints to work. All of your joints have less wear and tear when  properly supported by a spine with good posture. This means you will experience a healthier, less painful body.  Correct posture must be practiced with all of your activities, especially prolonged sitting and standing. Correct posture is as important when doing repetitive low-stress activities (typing) as it is when doing a single heavy-load activity (lifting). RESTING POSITIONS Consider which positions are most painful for you when choosing a resting position. If you have pain with flexion-based activities (sitting, bending, stooping, squatting), choose a position that allows you to rest in a less flexed posture. You would want to avoid curling into a fetal position on your side. If your pain worsens with extension-based activities (prolonged standing, working overhead), avoid resting in an extended position such as sleeping on your stomach. Most people will find more comfort when they rest with their spine in a more neutral position, neither too rounded nor too arched. Lying on a non-sagging bed on your side with a pillow between your knees, or on your back with a pillow under your knees will often provide some relief. Keep in mind, being in any one position for a prolonged period of time, no matter how correct your posture, can still lead to stiffness. PROPER SITTING POSTURE In order to minimize stress and discomfort on your spine, you must sit with correct posture. Sitting with good posture should be effortless for a healthy body. Returning to good posture is a gradual process. Many people can work toward this most comfortably by using various supports until they have the flexibility and strength to maintain this posture on their own. When sitting with proper posture, your ears will fall over your shoulders and your shoulders will fall over your hips. You should use the back of the chair to support your upper back. Your lower back will be in a neutral position, just slightly arched. You may place a small  pillow or folded towel at the base of your lower back for  support.  When working at a desk, create an environment that supports good, upright posture. Without extra support, muscles tire, which leads to excessive strain on joints and other tissues. Keep these recommendations in mind: CHAIR:  A chair should be able to slide under your desk when your back makes contact with the back of the chair. This allows  you to work closely.  The chair's height should allow your eyes to be level with the upper part of your monitor and your hands to be slightly lower than your elbows. BODY POSITION  Your feet should make contact with the floor. If this is not possible, use a foot rest.  Keep your ears over your shoulders. This will reduce stress on your neck and low back. INCORRECT SITTING POSTURES  If you are feeling tired and unable to assume a healthy sitting posture, do not slouch or slump. This puts excessive strain on your back tissues, causing more damage and pain. Healthier options include:  Using more support, like a lumbar pillow.  Switching tasks to something that requires you to be upright or walking.  Talking a brief walk.  Lying down to rest in a neutral-spine position. PROLONGED STANDING WHILE SLIGHTLY LEANING FORWARD  When completing a task that requires you to lean forward while standing in one place for a long time, place either foot up on a stationary 2-4 inch high object to help maintain the best posture. When both feet are on the ground, the lower back tends to lose its slight inward curve. If this curve flattens (or becomes too large), then the back and your other joints will experience too much stress, tire more quickly, and can cause pain. CORRECT STANDING POSTURES Proper standing posture should be assumed with all daily activities, even if they only take a few moments, like when brushing your teeth. As in sitting, your ears should fall over your shoulders and your shoulders should  fall over your hips. You should keep a slight tension in your abdominal muscles to brace your spine. Your tailbone should point down to the ground, not behind your body, resulting in an over-extended swayback posture.  INCORRECT STANDING POSTURES  Common incorrect standing postures include a forward head, locked knees and/or an excessive swayback. WALKING Walk with an upright posture. Your ears, shoulders and hips should all line-up. PROLONGED ACTIVITY IN A FLEXED POSITION When completing a task that requires you to bend forward at your waist or lean over a low surface, try to find a way to stabilize 3 out of 4 of your limbs. You can place a hand or elbow on your thigh or rest a knee on the surface you are reaching across. This will provide you more stability, so that your muscles do not tire as quickly. By keeping your knees relaxed, or slightly bent, you will also reduce stress across your lower back. CORRECT LIFTING TECHNIQUES DO :  Assume a wide stance. This will provide you more stability and the opportunity to get as close as possible to the object which you are lifting.  Tense your abdominals to brace your spine. Bend at the knees and hips. Keeping your back locked in a neutral-spine position, lift using your leg muscles. Lift with your legs, keeping your back straight.  Test the weight of unknown objects before attempting to lift them.  Try to keep your elbows locked down at your sides in order get the best strength from your shoulders when carrying an object.  Always ask for help when lifting heavy or awkward objects. INCORRECT LIFTING TECHNIQUES DO NOT:   Lock your knees when lifting, even if it is a small object.  Bend and twist. Pivot at your feet or move your feet when needing to change directions.  Assume that you can safely pick up even a paperclip without proper posture.   This information is  not intended to replace advice given to you by your health care provider. Make  sure you discuss any questions you have with your health care provider.   Document Released: 03/21/2005 Document Revised: 04/11/2014 Document Reviewed: 07/03/2008 [employer] Interactive Patient Education [employer].

## 2015-10-14 NOTE — Progress Notes (Signed)
Subjective:    Patient ID: Jacqueline Kaiser, female    DOB: Dec 16, 1945, 70 y.o.   MRN: [id number]   Jacqueline Kaiser is a 70 y.o. female who presents today for an acute visit.    HPI Comments: Patient here for evaluation of low back pain, started over 2 weeks. Has been seeing chiropracter for pain and said she needed steriods. Pain worse with bending and getting out of chair.  Has tried tyelonol without relief. Did have some relief from oxycontin, ice, and warm shower. No history of cancer or injury. No numbness, tingling,LE weakness, falls, saddle anesthesia, or urinary or bowel incontinence.     Past Medical History  Diagnosis Date  . Nonsustained ventricular tachycardia ([place])     by Holter,  noraml Stress test ECHO perpatient [name], [place])  . Hyperlipidemia   . Osteoporosis    Allergies: Betadine and Iodine Current Outpatient Prescriptions on File Prior to Visit  Medication Sig Dispense Refill  . alendronate (FOSAMAX) 70 MG tablet TAKE 1 TABLET EVERY 7 DAYS. TAKE WITH A FULL GLASS OF WATER ON AN EMPTY STOMACH (SEE PACKAGE FOR ADDL INSTRUCTIONS) 12 tablet 3  . Azelastine-Fluticasone (DYMISTA) 137-50 MCG/ACT SUSP Place 1 puff into both nostrils daily.    . calcium carbonate (OS-CAL) 600 MG TABS Take 1 tablet (600 mg total) by mouth 2 (two) times daily with a meal. 180 tablet 0  . cholecalciferol (VITAMIN D) 1000 UNITS tablet Take 1,000 Units by mouth daily.    . diazepam (VALIUM) 5 MG tablet Take 1 tablet (5 mg total) by mouth every 12 (twelve) hours as needed for anxiety. 30 tablet 1  . fexofenadine (ALLEGRA) 180 MG tablet Take 180 mg by mouth daily.    . meclizine (ANTIVERT) 12.5 MG tablet Take 1 tablet (12.5 mg total) by mouth 4 (four) times daily as needed for dizziness. 30 tablet 3  . naproxen sodium (ANAPROX) 220 MG tablet Take 220 mg by mouth once.    . simvastatin (ZOCOR) 40 MG tablet TAKE 1 TABLET EVERY EVENING (APPOINTMENT NEEDED FOR REFILLS) 90 tablet 4  . Sodium  Chloride-Sodium Bicarb (CLASSIC NETI POT SINUS WASH NA) Place into the nose. Bid     No current facility-administered medications on file prior to visit.    Social History  Substance Use Topics  . Smoking status: Former Smoker -- 0.50 packs/day for 10 years    Types: Cigarettes    Quit date: 08/24/1971  . Smokeless tobacco: Never Used  . Alcohol Use: Yes    Review of Systems  Constitutional: Negative for fever and chills.  Respiratory: Negative for cough.   Cardiovascular: Negative for chest pain and palpitations.  Gastrointestinal: Negative for nausea and vomiting.  Musculoskeletal: Positive for back pain.  Neurological: Negative for weakness, numbness and headaches.      Objective:    BP 122/68 mmHg  Pulse 71  Temp(Src) 97.8 F (36.6 C) (Oral)  Ht 5\' 7"  (1.702 m)  Wt 190 lb 9.6 oz (86.456 kg)  BMI 29.85 kg/m2  SpO2 96%   Physical Exam  Constitutional: She appears well-developed and well-nourished.  Eyes: Conjunctivae are normal.  Cardiovascular: Normal rate, regular rhythm, normal heart sounds and normal pulses.   Pulmonary/Chest: Effort normal and breath sounds normal. She has no wheezes. She has no rhonchi. She has no rales.  Musculoskeletal:       Lumbar back: She exhibits normal range of motion, no tenderness, no bony tenderness, no swelling, no edema, no pain and no spasm.  Full range of motion with flexion, tension, lateral side bends. No bony tenderness. No pain, numbness, tingling elicited with single leg raise bilaterally.   Neurological: She is alert. She has normal strength. No sensory deficit.  Reflex Scores:      Patellar reflexes are 2+ on the right side and 2+ on the left side. Sensation and strength intact bilateral lower extremities.  Skin: Skin is warm and dry.  Psychiatric: She has a normal mood and affect. Her speech is normal and behavior is normal. Thought content normal.  Vitals reviewed.      Assessment & Plan:   1. Acute low back  pain Suspect muscle strain v degenerative etiology. No red flag symptoms. Request referral to chiropractor and steroids. I explained that steroids are more effective with sciatic symptoms which she does have. Patient preference for steroids and referral which I obliged. Advised PT referral if treatment plan doesn't help.   - predniSONE (DELTASONE) 10 MG tablet; Take 4 tablets ( total 40 mg) by mouth for 2 days; take 3 tablets ( total 30 mg) by mouth for 2 days; take 2 tablets ( total 20 mg) by mouth for 1 day; take 1 tablet ( total 10 mg) by mouth for 1 day.  Dispense: 17 tablet; Refill: 0 - diclofenac sodium (VOLTAREN) 1 % GEL; Apply 4 g topically 4 (four) times daily.  Dispense: 1 Tube; Refill: 3 - Ambulatory referral to Chiropractic    I am having Ms. O'Brien maintain her cholecalciferol, calcium carbonate, fexofenadine, Azelastine-Fluticasone, Sodium Chloride-Sodium Bicarb (CLASSIC NETI POT SINUS WASH NA), naproxen sodium, diazepam, meclizine, alendronate, and simvastatin.   No orders of the defined types were placed in this encounter.    Return precautions given.   Risks, benefits, and alternatives of the medications and treatment plan prescribed today were discussed, and patient expressed understanding.   Education regarding symptom management and diagnosis given to patient on AVS.  Continue to follow with TULLO, Aris Everts, MD for routine health maintenance.   Jearld Adjutant and I agreed with plan.   Mable Paris, FNP

## 2015-12-30 ENCOUNTER — Other Ambulatory Visit: Payer: Self-pay | Admitting: Internal Medicine

## 2016-02-03 ENCOUNTER — Other Ambulatory Visit: Payer: Self-pay | Admitting: Internal Medicine

## 2016-02-03 DIAGNOSIS — Z1231 Encounter for screening mammogram for malignant neoplasm of breast: Secondary | ICD-10-CM

## 2016-02-08 ENCOUNTER — Ambulatory Visit
Admission: RE | Admit: 2016-02-08 | Discharge: 2016-02-08 | Disposition: A | Discharge status: Home / Self Care | Payer: Medicare PPO | Source: Ambulatory Visit | Attending: Internal Medicine | Admitting: Internal Medicine

## 2016-02-08 DIAGNOSIS — Z1231 Encounter for screening mammogram for malignant neoplasm of breast: Secondary | ICD-10-CM | POA: Diagnosis present

## 2016-02-11 ENCOUNTER — Encounter: Payer: Self-pay | Admitting: Internal Medicine

## 2016-02-11 ENCOUNTER — Ambulatory Visit (INDEPENDENT_AMBULATORY_CARE_PROVIDER_SITE_OTHER): Payer: Medicare PPO | Admitting: Internal Medicine

## 2016-02-11 VITALS — BP 100/66 | HR 81 | Temp 98.1°F | Resp 16 | Wt 195.0 lb

## 2016-02-11 DIAGNOSIS — Z79899 Other long term (current) drug therapy: Secondary | ICD-10-CM

## 2016-02-11 DIAGNOSIS — E663 Overweight: Secondary | ICD-10-CM

## 2016-02-11 DIAGNOSIS — M818 Other osteoporosis without current pathological fracture: Secondary | ICD-10-CM

## 2016-02-11 DIAGNOSIS — K2100 Gastro-esophageal reflux disease with esophagitis, without bleeding: Secondary | ICD-10-CM

## 2016-02-11 DIAGNOSIS — R5383 Other fatigue: Secondary | ICD-10-CM

## 2016-02-11 DIAGNOSIS — Z0000 Encounter for general adult medical examination without abnormal findings: Secondary | ICD-10-CM | POA: Diagnosis not present

## 2016-02-11 DIAGNOSIS — M810 Age-related osteoporosis without current pathological fracture: Secondary | ICD-10-CM

## 2016-02-11 DIAGNOSIS — F409 Phobic anxiety disorder, unspecified: Secondary | ICD-10-CM

## 2016-02-11 DIAGNOSIS — F5105 Insomnia due to other mental disorder: Secondary | ICD-10-CM

## 2016-02-11 DIAGNOSIS — E7800 Pure hypercholesterolemia, unspecified: Secondary | ICD-10-CM

## 2016-02-11 LAB — CBC WITH DIFFERENTIAL/PLATELET
BASOS PCT: 0.7 % (ref 0.0–3.0)
Basophils Absolute: 0 10*3/uL (ref 0.0–0.1)
EOS PCT: 2.8 % (ref 0.0–5.0)
Eosinophils Absolute: 0.2 10*3/uL (ref 0.0–0.7)
HEMATOCRIT: 42.4 % (ref 36.0–46.0)
Hemoglobin: 14.4 g/dL (ref 12.0–15.0)
LYMPHS ABS: 1.7 10*3/uL (ref 0.7–4.0)
LYMPHS PCT: 29.4 % (ref 12.0–46.0)
MCHC: 34 g/dL (ref 30.0–36.0)
MCV: 91.2 fl (ref 78.0–100.0)
MONOS PCT: 6.8 % (ref 3.0–12.0)
Monocytes Absolute: 0.4 10*3/uL (ref 0.1–1.0)
NEUTROS ABS: 3.5 10*3/uL (ref 1.4–7.7)
NEUTROS PCT: 60.3 % (ref 43.0–77.0)
PLATELETS: 248 10*3/uL (ref 150.0–400.0)
RBC: 4.65 Mil/uL (ref 3.87–5.11)
RDW: 12.7 % (ref 11.5–15.5)
WBC: 5.8 10*3/uL (ref 4.0–10.5)

## 2016-02-11 LAB — LIPID PANEL
Cholesterol: 196 mg/dL (ref 0–200)
HDL: 65.9 mg/dL (ref >39.00)
LDL Cholesterol: 102 mg/dL — ABNORMAL HIGH (ref 0–99)
NonHDL: 129.71
Total CHOL/HDL Ratio: 3
Triglycerides: 139 mg/dL (ref 0.0–149.0)
VLDL: 27.8 mg/dL (ref 0.0–40.0)

## 2016-02-11 LAB — COMPREHENSIVE METABOLIC PANEL
ALK PHOS: 62 U/L (ref 39–117)
ALT: 11 U/L (ref 0–35)
AST: 18 U/L (ref 0–37)
Albumin: 4.6 g/dL (ref 3.5–5.2)
BUN: 19 mg/dL (ref 6–23)
CO2: 32 meq/L (ref 19–32)
Calcium: 9.9 mg/dL (ref 8.4–10.5)
Chloride: 103 mEq/L (ref 96–112)
Creatinine, Ser: 0.94 mg/dL (ref 0.40–1.20)
GFR: 62.15 mL/min (ref >60.00)
GLUCOSE: 92 mg/dL (ref 70–99)
POTASSIUM: 4.6 meq/L (ref 3.5–5.1)
SODIUM: 141 meq/L (ref 135–145)
TOTAL PROTEIN: 7.2 g/dL (ref 6.0–8.3)
Total Bilirubin: 0.4 mg/dL (ref 0.2–1.2)

## 2016-02-11 LAB — VITAMIN D 25 HYDROXY (VIT D DEFICIENCY, FRACTURES): VITD: 46.7 ng/mL (ref 30.00–100.00)

## 2016-02-11 LAB — TSH: TSH: 2.78 u[IU]/mL (ref 0.35–4.50)

## 2016-02-11 MED ORDER — OMEPRAZOLE 20 MG PO CPDR: 20.0000 mg | DELAYED_RELEASE_CAPSULE | Freq: Two times a day (BID) | ORAL | 2 refills | Status: DC

## 2016-02-11 MED ORDER — ALPRAZOLAM 0.25 MG PO TABS: 0.2500 mg | ORAL_TABLET | Freq: Every evening | ORAL | 2 refills | Status: AC | PRN

## 2016-02-11 NOTE — Patient Instructions (Addendum)
1) For your reflux:  Start taking omeprazole 20 mg In the morning 15 minutes before food or coffee and repeat dose 15 minutes before dinner.  Your can still use tums or rolaids as needed   20 Your recent lumbar fracture despite taking alendronate concerns me that you need an alternative osteoporosis medication.  I am referring you to an endocrinologist to discuss. .Your referral is in process as discussed. Our referral coordinator will call you when the appointment has been made. If you do not hear from [facility] in our office in a weettrefk,  Please call us back  3) For your insomnia,  I am refilling the alprazolam for PRN use   Food Choices for Gastroesophageal Reflux Disease, Adult When you have gastroesophageal reflux disease (GERD), the foods you eat and your eating habits are very important. Choosing the right foods can help ease the discomfort of GERD. WHAT GENERAL GUIDELINES DO I NEED TO FOLLOW?  Choose fruits, vegetables, whole grains, low-fat dairy products, and low-fat meat, fish, and poultry.  Limit fats such as oils, salad dressings, butter, nuts, and avocado.  Keep a food diary to identify foods that cause symptoms.  Avoid foods that cause reflux. These may be different for different people.  Eat frequent small meals instead of three large meals each day.  Eat your meals slowly, in a relaxed setting.  Limit fried foods.  Cook foods using methods other than frying.  Avoid drinking alcohol.  Avoid drinking large amounts of liquids with your meals.  Avoid bending over or lying down until 2-3 hours after eating. WHAT FOODS ARE NOT RECOMMENDED? The following are some foods and drinks that may worsen your symptoms: Vegetables Tomatoes. Tomato juice. Tomato and spaghetti sauce. Chili peppers. Onion and garlic. Horseradish. Fruits Oranges, grapefruit, and lemon (fruit and juice). Meats High-fat meats, fish, and poultry. This includes hot dogs, ribs, ham, sausage, salami,  and bacon. Dairy Whole milk and chocolate milk. Sour cream. Cream. Butter. Ice cream. Cream cheese.  Beverages Coffee and tea, with or without caffeine. Carbonated beverages or energy drinks. Condiments Hot sauce. Barbecue sauce.  Sweets/Desserts Chocolate and cocoa. Donuts. Peppermint and spearmint. Fats and Oils High-fat foods, including [place] fries and potato chips. Other Vinegar. Strong spices, such as black pepper, white pepper, red pepper, cayenne, curry powder, cloves, ginger, and chili powder. The items listed above may not be a complete list of foods and beverages to avoid. Contact your dietitian for more information.   This information is not intended to replace advice given to you by your health care provider. Make sure you discuss any questions you have with your health care provider.   Document Released: 03/21/2005 Document Revised: 04/11/2014 Document Reviewed: 01/23/2013 Elsevier Interactive Patient Education [employer].

## 2016-02-11 NOTE — Progress Notes (Signed)
Subjective:  Patient ID: Jacqueline Kaiser, female    DOB: 11/16/1945  Age: 70 y.o. MRN: [id number]  CC: The primary encounter diagnosis was Pure hypercholesterolemia. Diagnoses of Osteoporosis of lumbar spine, Long-term use of high-risk medication, Fatigue, unspecified type, Encounter for preventive health examination, Overweight (BMI 25.0-29.9), Gastroesophageal reflux disease with esophagitis, and Insomnia due to anxiety and fear were also pertinent to this visit.  HPI Jacqueline Kaiser presents for routine annual exam and treatment of GERD.    Symptoms have been  been present for 2 months,  No improvement  With  Tums,  Rolaids and OTC  meds including omeprazole 20 prn .Aggravated by eating,  Starts as pain in the SS chest area followed by  entire throat starts burning .    Recent episodes of acute back pain .  Sustained an  L4 fracture  Aug 12 while sneezing . Pain was excruciating and radiated to  mid thigh.  Was referred to Dr [facility] , by chiropractor [facility]   Had plain films and MRI .   Has been in a "body brace "  Until last week.  Had 6 PT sessions at [facility] and has been released to return to water aerobics.  Last  DEXA scan was done at [facility] in February  ,  T scores osteopenia .has been taking fosamax .  Takes D3 3000 iIUs of D3 daily    Diet reviewed  Has been somewhat indulgent,  Eating Candy almost daily,  Eats  Oatmeal for breakfast,  sweetened with brown sugar .     Mammogram  Normal Nov 2017  Insomnia:  She is waking up at 3 am -4 am nearly every night . Has difficulty  returning to  Sleep due to excessive worry about her  Husband. Her anxiety is triggered by the serious health conditions of her husband  She denies having panic attacks,   Bedtime habits reviewed.  She is using her i phone in the evenings for social media.  She does not drink alcohol and limits caffeinated beverages to the morning hours .    Outpatient Medications Prior to Visit  Medication Sig  Dispense Refill  . alendronate (FOSAMAX) 70 MG tablet TAKE 1 TABLET EVERY 7 DAYS. TAKE WITH A FULL GLASS OF WATER ON AN EMPTY STOMACH (SEE PACKAGE FOR ADDL INSTRUCTIONS) 12 tablet 1  . Azelastine-Fluticasone (DYMISTA) 137-50 MCG/ACT SUSP Place 1 puff into both nostrils daily.    . calcium carbonate (OS-CAL) 600 MG TABS Take 1 tablet (600 mg total) by mouth 2 (two) times daily with a meal. 180 tablet 0  . cholecalciferol (VITAMIN D) 1000 UNITS tablet Take 1,000 Units by mouth daily.    . diazepam (VALIUM) 5 MG tablet Take 1 tablet (5 mg total) by mouth every 12 (twelve) hours as needed for anxiety. 30 tablet 1  . diclofenac sodium (VOLTAREN) 1 % GEL Apply 4 g topically 4 (four) times daily. 1 Tube 3  . fexofenadine (ALLEGRA) 180 MG tablet Take 180 mg by mouth daily.    . meclizine (ANTIVERT) 12.5 MG tablet Take 1 tablet (12.5 mg total) by mouth 4 (four) times daily as needed for dizziness. 30 tablet 3  . naproxen sodium (ANAPROX) 220 MG tablet Take 220 mg by mouth once.    . simvastatin (ZOCOR) 40 MG tablet TAKE 1 TABLET EVERY EVENING (APPOINTMENT NEEDED FOR REFILLS) 90 tablet 4  . Sodium Chloride-Sodium Bicarb (CLASSIC NETI POT SINUS WASH NA) Place into the nose. Bid    .  predniSONE (DELTASONE) 10 MG tablet Take 4 tablets ( total 40 mg) by mouth for 2 days; take 3 tablets ( total 30 mg) by mouth for 2 days; take 2 tablets ( total 20 mg) by mouth for 1 day; take 1 tablet ( total 10 mg) by mouth for 1 day. (Patient not taking: Reported on 02/11/2016) 17 tablet 0   No facility-administered medications prior to visit.     Review of Systems;  Patient denies headache, fevers, malaise, unintentional weight loss, skin rash, eye pain, sinus congestion and sinus pain, sore throat, dysphagia,  hemoptysis , cough, dyspnea, wheezing, chest pain, palpitations, orthopnea, edema, abdominal pain, nausea, melena, diarrhea, constipation, flank pain, dysuria, hematuria, urinary  Frequency, nocturia, numbness,  tingling, seizures,  Focal weakness, Loss of consciousness,  Tremor, insomnia, depression, anxiety, and suicidal ideation.      Objective:  BP 100/66 (BP Location: Left Arm, Patient Position: Sitting, Cuff Size: Large)   Pulse 81   Temp 98.1 F (36.7 C) (Oral)   Resp 16   Wt 195 lb (88.5 kg)   SpO2 96%   BMI 30.54 kg/m   BP Readings from Last 3 Encounters:  02/11/16 100/66  10/14/15 122/68  12/15/14 126/74    Wt Readings from Last 3 Encounters:  02/11/16 195 lb (88.5 kg)  10/14/15 190 lb 9.6 oz (86.5 kg)  12/15/14 191 lb 4 oz (86.8 kg)    General appearance: alert, cooperative and appears stated age Ears: normal TM's and external ear canals both ears Throat: lips, mucosa, and tongue normal; teeth and gums normal Neck: no adenopathy, no carotid bruit, supple, symmetrical, trachea midline and thyroid not enlarged, symmetric, no tenderness/mass/nodules Back: symmetric, no curvature. ROM normal. No CVA tenderness. Lungs: clear to auscultation bilaterally Heart: regular rate and rhythm, S1, S2 normal, no murmur, click, rub or gallop Abdomen: soft, non-tender; bowel sounds normal; no masses,  no organomegaly Pulses: 2+ and symmetric Skin: Skin color, texture, turgor normal. No rashes or lesions Lymph nodes: Cervical, supraclavicular, and axillary nodes normal.  No results found for: HGBA1C  Lab Results  Component Value Date   CREATININE 0.94 02/11/2016   CREATININE 0.77 12/15/2014   CREATININE 0.9 09/30/2013    Lab Results  Component Value Date   WBC 5.8 02/11/2016   HGB 14.4 02/11/2016   HCT 42.4 02/11/2016   PLT 248.0 02/11/2016   GLUCOSE 92 02/11/2016   CHOL 196 02/11/2016   TRIG 139.0 02/11/2016   HDL 65.90 02/11/2016   LDLDIRECT 109.0 12/15/2014   LDLCALC 102 (H) 02/11/2016   ALT 11 02/11/2016   AST 18 02/11/2016   NA 141 02/11/2016   K 4.6 02/11/2016   CL 103 02/11/2016   CREATININE 0.94 02/11/2016   BUN 19 02/11/2016   CO2 32 02/11/2016   TSH 2.78  02/11/2016    Mm Screening Breast Tomo Bilateral  Result Date: 02/08/2016 CLINICAL DATA:  Screening. EXAM: 2D DIGITAL SCREENING BILATERAL MAMMOGRAM WITH CAD AND ADJUNCT TOMO COMPARISON:  Previous exam(s). ACR Breast Density Category b: There are scattered areas of fibroglandular density. FINDINGS: There are no findings suspicious for malignancy. Images were processed with CAD. IMPRESSION: No mammographic evidence of malignancy. A result letter of this screening mammogram will be mailed directly to the patient. RECOMMENDATION: Screening mammogram in one year. (Code:SM-B-01Y) BI-RADS CATEGORY  1: Negative. Electronically Signed   By: Pamelia Hoit M.D.   On: 02/08/2016 18:16    Assessment & Plan:   Problem List Items Addressed This Visit  Overweight (BMI 25.0-29.9)    I have addressed BMI   and recommended a low glycemic index diet utilizing smaller more frequent meals to increase metabolism.  I have also recommended that patient start exercising with a goal of 30 minutes of aerobic exercise a minimum of 5 days per week. Screening for lipid disorders, thyroid and diabetes to be done today.   Lab Results  Component Value Date   TSH 2.78 02/11/2016   No results found for: HGBA1C       Osteoporosis of lumbar spine    I consider her a treatment failure with alendronate given her recent L4 nontraumatic fracture.  Referring to Endocrinology for management.       Relevant Orders   VITAMIN D 25 Hydroxy (Vit-D Deficiency, Fractures) (Completed)   Ambulatory referral to Endocrinology   Encounter for preventive health examination    Annual comprehensive preventive exam was done as well as an evaluation and management of chronic conditions .  During the course of the visit the patient was educated and counseled about appropriate screening and preventive services including :  diabetes screening, lipid analysis with projected  10 year  risk for CAD , nutrition counseling, breast, cervical and  colorectal cancer screening, and recommended immunizations.  Printed recommendations for health maintenance screenings was given      GERD (gastroesophageal reflux disease)    Starting omeprazole 20 mg bid.  GERD diet given.       Relevant Medications   omeprazole (PRILOSEC) 20 MG capsule   Insomnia due to anxiety and fear    Discussed natural remedies for insomnia including herbal tea and melatonin for daily use and prn alprazolam for early morning waking. .  Reviewed principles of good sleep hygiene. The risks and benefits of benzodiazepine use were discussed with patient today including excessive sedation leading to respiratory depression,  impaired thinking/driving, and addiction.  Patient was advised to avoid concurrent use with alcohol, to use medication only as needed and not to share with others  .       Hyperlipidemia - Primary   Relevant Orders   Lipid panel (Completed)    Other Visit Diagnoses    Long-term use of high-risk medication       Relevant Orders   Comprehensive metabolic panel (Completed)   Fatigue, unspecified type       Relevant Orders   Comprehensive metabolic panel (Completed)   CBC with Differential/Platelet (Completed)   TSH (Completed)      I am having Ms. O'Brien start on omeprazole. I am also having her maintain her cholecalciferol, calcium carbonate, fexofenadine, Azelastine-Fluticasone, Sodium Chloride-Sodium Bicarb (CLASSIC NETI POT SINUS WASH NA), naproxen sodium, diazepam, meclizine, simvastatin, predniSONE, diclofenac sodium, alendronate, and ALPRAZolam.  Meds ordered this encounter  Medications  . ALPRAZolam (XANAX) 0.25 MG tablet    Sig: Take 1 tablet (0.25 mg total) by mouth at bedtime as needed for sleep.    Dispense:  30 tablet    Refill:  2  . omeprazole (PRILOSEC) 20 MG capsule    Sig: Take 1 capsule (20 mg total) by mouth 2 (two) times daily before a meal.    Dispense:  60 capsule    Refill:  2    Medications Discontinued During  This Encounter  Medication Reason  . ALPRAZolam (XANAX) 0.25 MG tablet Reorder    Follow-up: No Follow-up on file.   Crecencio Mc, MD

## 2016-02-14 DIAGNOSIS — K219 Gastro-esophageal reflux disease without esophagitis: Secondary | ICD-10-CM | POA: Insufficient documentation

## 2016-02-14 DIAGNOSIS — F5105 Insomnia due to other mental disorder: Secondary | ICD-10-CM

## 2016-02-14 DIAGNOSIS — F409 Phobic anxiety disorder, unspecified: Secondary | ICD-10-CM | POA: Insufficient documentation

## 2016-02-14 NOTE — Assessment & Plan Note (Signed)
Starting omeprazole 20 mg bid.  GERD diet given.

## 2016-02-14 NOTE — Assessment & Plan Note (Signed)
Discussed natural remedies for insomnia including herbal tea and melatonin for daily use and prn alprazolam for early morning waking. .  Reviewed principles of good sleep hygiene. The risks and benefits of benzodiazepine use were discussed with patient today including excessive sedation leading to respiratory depression,  impaired thinking/driving, and addiction.  Patient was advised to avoid concurrent use with alcohol, to use medication only as needed and not to share with others  .

## 2016-02-14 NOTE — Assessment & Plan Note (Signed)
Annual comprehensive preventive exam was done as well as an evaluation and management of chronic conditions .  During the course of the visit the patient was educated and counseled about appropriate screening and preventive services including :  diabetes screening, lipid analysis with projected  10 year  risk for CAD , nutrition counseling, breast, cervical and colorectal cancer screening, and recommended immunizations.  Printed recommendations for health maintenance screenings was given 

## 2016-02-14 NOTE — Assessment & Plan Note (Addendum)
I have addressed BMI   and recommended a low glycemic index diet utilizing smaller more frequent meals to increase metabolism.  I have also recommended that patient start exercising with a goal of 30 minutes of aerobic exercise a minimum of 5 days per week. Screening for lipid disorders, thyroid and diabetes to be done today.   Lab Results  Component Value Date   TSH 2.78 02/11/2016   No results found for: HGBA1C

## 2016-02-14 NOTE — Assessment & Plan Note (Signed)
I consider her a treatment failure with alendronate given her recent L4 nontraumatic fracture.  Referring to Endocrinology for management.

## 2016-03-09 ENCOUNTER — Ambulatory Visit: Payer: Medicare PPO

## 2016-03-21 ENCOUNTER — Ambulatory Visit (INDEPENDENT_AMBULATORY_CARE_PROVIDER_SITE_OTHER): Payer: Medicare PPO

## 2016-03-21 ENCOUNTER — Other Ambulatory Visit: Payer: Self-pay | Admitting: Internal Medicine

## 2016-03-21 VITALS — BP 108/68 | HR 80 | Temp 98.1°F | Resp 12 | Ht 66.0 in | Wt 195.8 lb

## 2016-03-21 DIAGNOSIS — Z0000 Encounter for general adult medical examination without abnormal findings: Secondary | ICD-10-CM | POA: Diagnosis not present

## 2016-03-21 DIAGNOSIS — R350 Frequency of micturition: Secondary | ICD-10-CM

## 2016-03-21 NOTE — Progress Notes (Signed)
Subjective:   Jacqueline Kaiser is a 70 y.o. female who presents for Medicare Annual (Subsequent) preventive examination.  Review of Systems:  No ROS.  Medicare Wellness Visit.  Cardiac Risk Factors include: advanced age (>25men, >60 women);obesity (BMI >30kg/m2)     Objective:     Vitals: BP 108/68 (BP Location: Left Arm, Patient Position: Sitting, Cuff Size: Normal)   Pulse 80   Temp 98.1 F (36.7 C) (Oral)   Resp 12   Ht 5\' 6"  (1.676 m)   Wt 195 lb 12.8 oz (88.8 kg)   SpO2 97%   BMI 31.60 kg/m   Body mass index is 31.6 kg/m.   Tobacco History  Smoking Status  . Former Smoker  . Packs/day: 0.50  . Years: 10.00  . Types: Cigarettes  . Quit date: 08/24/1971  Smokeless Tobacco  . Never Used     Counseling given: Not Answered   Past Medical History:  Diagnosis Date  . Hyperlipidemia   . Nonsustained ventricular tachycardia ([place])    by Holter,  noraml Stress test ECHO perpatient [name], [place])  . Osteoporosis    Past Surgical History:  Procedure Laterality Date  . ABDOMINAL HYSTERECTOMY    . BREAST BIOPSY Right    negative 10-12 years ago- core  . CESAREAN SECTION     Family History  Problem Relation Age of Onset  . Heart disease Mother     ATRIAL FIB  . Cancer Father 33    Lung Ca,  nonsmoker, metastatic at diagnosis  asbestos exposure  . Breast cancer Daughter 5   History  Sexual Activity  . Sexual activity: Not Currently    Outpatient Encounter Prescriptions as of 03/21/2016  Medication Sig  . alendronate (FOSAMAX) 70 MG tablet TAKE 1 TABLET EVERY 7 DAYS. TAKE WITH A FULL GLASS OF WATER ON AN EMPTY STOMACH (SEE PACKAGE FOR ADDL INSTRUCTIONS)  . calcium carbonate (OS-CAL) 600 MG TABS Take 1 tablet (600 mg total) by mouth 2 (two) times daily with a meal.  . cholecalciferol (VITAMIN D) 1000 UNITS tablet Take 1,000 Units by mouth daily.  . diazepam (VALIUM) 5 MG tablet Take 1 tablet (5 mg total) by mouth every 12 (twelve) hours as needed for  anxiety.  . diclofenac sodium (VOLTAREN) 1 % GEL Apply 4 g topically 4 (four) times daily.  . fexofenadine (ALLEGRA) 180 MG tablet Take 180 mg by mouth daily.  . meclizine (ANTIVERT) 12.5 MG tablet Take 1 tablet (12.5 mg total) by mouth 4 (four) times daily as needed for dizziness.  . naproxen sodium (ANAPROX) 220 MG tablet Take 220 mg by mouth once.  Marland Kitchen omeprazole (PRILOSEC) 20 MG capsule Take 1 capsule (20 mg total) by mouth 2 (two) times daily before a meal.  . simvastatin (ZOCOR) 40 MG tablet TAKE 1 TABLET EVERY EVENING (APPOINTMENT NEEDED FOR REFILLS)  . Sodium Chloride-Sodium Bicarb (CLASSIC NETI POT SINUS WASH NA) Place into the nose. Bid  . [DISCONTINUED] predniSONE (DELTASONE) 10 MG tablet Take 4 tablets ( total 40 mg) by mouth for 2 days; take 3 tablets ( total 30 mg) by mouth for 2 days; take 2 tablets ( total 20 mg) by mouth for 1 day; take 1 tablet ( total 10 mg) by mouth for 1 day.  . Azelastine-Fluticasone (DYMISTA) 137-50 MCG/ACT SUSP Place 1 puff into both nostrils daily.   No facility-administered encounter medications on file as of 03/21/2016.     Activities of Daily Living In your present state of health, do  you have any difficulty performing the following activities: 03/21/2016  Hearing? N  Vision? N  Difficulty concentrating or making decisions? Y  Walking or climbing stairs? N  Dressing or bathing? N  Doing errands, shopping? N  Preparing Food and eating ? N  Using the Toilet? N  In the past six months, have you accidently leaked urine? N  Do you have problems with loss of bowel control? N  Managing your Medications? N  Managing your Finances? N  Housekeeping or managing your Housekeeping? N  Some recent data might be hidden    Patient Care Team: Crecencio Mc, MD as PCP - General (Internal Medicine)    Assessment:    This is a routine wellness examination for [facility]. The goal of the wellness visit is to assist the patient how to close the gaps in care and  create a preventative care plan for the patient.   Taking calcium VIT D as appropriate/Osteoporosis reviewed.  Medications reviewed; taking without issues or barriers.  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  Smoke detectors in the home. No firearms in the home. Wears seatbelts when driving or riding with others. No violence in the home.  No identified risk were noted; The patient was oriented x 3; appropriate in dress and manner and no objective failures at ADL's or IADL's.   BMI; discussed the importance of a healthy diet, water intake and exercise. Educational material provided.  Patient Concerns: None at this time. Follow up with PCP as needed.  Exercise Activities and Dietary recommendations Current Exercise Habits: Home exercise routine (Active at home with yard work), Time (Minutes): 20, Frequency (Times/Week): 3, Weekly Exercise (Minutes/Week): 60, Intensity: Mild  Goals    . Increase physical activity          Water aerobic classes 3 times a week      Fall Risk Fall Risk  03/21/2016  Falls in the past year? No   Depression Screen PHQ 2/9 Scores 03/21/2016  PHQ - 2 Score 0     Cognitive Function     6CIT Screen 03/21/2016  What Year? 0 points  What month? 0 points  What time? 0 points  Count back from 20 0 points  Months in reverse 0 points    Immunization History  Administered Date(s) Administered  . Influenza Split 01/06/2012, 12/15/2012  . Influenza-Unspecified 12/04/2015  . Pneumococcal Conjugate-13 12/15/2014  . Pneumococcal Polysaccharide-23 12/15/2007  . Tdap 10/13/2013   Screening Tests Health Maintenance  Topic Date Due  . ZOSTAVAX  10/06/2003  . PNA vac Low Risk Adult (2 of 2 - PPSV23) 12/15/2015  . MAMMOGRAM  02/07/2018  . COLONOSCOPY  10/23/2020  . TETANUS/TDAP  10/14/2023  . INFLUENZA VACCINE  Completed  . DEXA SCAN  Completed  . Hepatitis C Screening  Completed      Plan:    End of  life planning; Advance aging; Advanced directives discussed. Copy of current HCPOA/Living Will on file.  Medicare Attestation I have personally reviewed: The patient's medical and social history Their use of alcohol, tobacco or illicit drugs Their current medications and supplements The patient's functional ability including ADLs,fall risks, home safety risks, cognitive, and hearing and visual impairment Diet and physical activities Evidence for depression   The patient's weight, height, BMI, and visual acuity have been recorded in the chart.  I have made referrals and provided education to the patient based on review of the above and I have provided  the patient with a written personalized care plan for preventive services.    During the course of the visit the patient was educated and counseled about the following appropriate screening and preventive services:   Vaccines to include Pneumoccal, Influenza, Hepatitis B, Td, Zostavax, HCV  Electrocardiogram  Cardiovascular Disease  Colorectal cancer screening  Bone density screening  Diabetes screening  Glaucoma screening  Mammography/PAP  Nutrition counseling   Patient Instructions (the written plan) was given to the patient.   Varney Biles, LPN  [phone]

## 2016-03-21 NOTE — Patient Instructions (Addendum)
  Jacqueline Kaiser , Thank you for taking time to come for your Medicare Wellness Visit. I appreciate your ongoing commitment to your health goals. Please review the following plan we discussed and let me know if I can assist you in the future.   FOLLOW UP WITH DR. Derrel Nip AS NEEDED.  MERRY CHRISTMAS!!  These are the goals we discussed: Goals    . Increase physical activity          Water aerobic classes 3 times a week       This is a list of the screening recommended for you and due dates:  Health Maintenance  Topic Date Due  . Shingles Vaccine  10/06/2003  . Pneumonia vaccines (2 of 2 - PPSV23) 12/15/2015  . Mammogram  02/07/2018  . Colon Cancer Screening  10/23/2020  . Tetanus Vaccine  10/14/2023  . Flu Shot  Completed  . DEXA scan (bone density measurement)  Completed  .  Hepatitis C: One time screening is recommended by Center for Disease Control  (CDC) for  adults born from [birth year] through 1967.   Completed

## 2016-03-22 ENCOUNTER — Other Ambulatory Visit (INDEPENDENT_AMBULATORY_CARE_PROVIDER_SITE_OTHER): Payer: Medicare PPO

## 2016-03-22 DIAGNOSIS — R350 Frequency of micturition: Secondary | ICD-10-CM

## 2016-03-22 LAB — URINALYSIS, ROUTINE W REFLEX MICROSCOPIC
Bilirubin Urine: NEGATIVE
Hgb urine dipstick: NEGATIVE
KETONES UR: NEGATIVE
Leukocytes, UA: NEGATIVE
Nitrite: NEGATIVE
PH: 5 (ref 5.0–8.0)
RBC / HPF: NONE SEEN (ref 0–?)
SPECIFIC GRAVITY, URINE: 1.01 (ref 1.000–1.030)
Total Protein, Urine: NEGATIVE
URINE GLUCOSE: NEGATIVE
UROBILINOGEN UA: 0.2 (ref 0.0–1.0)
WBC, UA: NONE SEEN (ref 0–?)

## 2016-03-23 ENCOUNTER — Encounter: Payer: Self-pay | Admitting: Internal Medicine

## 2016-03-23 ENCOUNTER — Other Ambulatory Visit: Payer: Self-pay | Admitting: Internal Medicine

## 2016-03-23 MED ORDER — MIRABEGRON ER 25 MG PO TB24: 25.0000 mg | ORAL_TABLET | Freq: Every day | ORAL | 5 refills | Status: DC

## 2016-03-23 NOTE — Progress Notes (Signed)
  I have reviewed the above information and agree with above.   Sameena Artus, MD 

## 2016-03-24 LAB — URINE CULTURE: ORGANISM ID, BACTERIA: NO GROWTH

## 2016-04-07 ENCOUNTER — Ambulatory Visit: Payer: Medicare PPO

## 2016-05-05 DIAGNOSIS — K7689 Other specified diseases of liver: Secondary | ICD-10-CM

## 2016-05-05 HISTORY — DX: Other specified diseases of liver: K76.89

## 2016-05-16 ENCOUNTER — Ambulatory Visit (INDEPENDENT_AMBULATORY_CARE_PROVIDER_SITE_OTHER): Payer: Medicare PPO | Admitting: Internal Medicine

## 2016-05-16 ENCOUNTER — Encounter: Payer: Self-pay | Admitting: Internal Medicine

## 2016-05-16 VITALS — BP 110/72 | HR 65 | Temp 97.7°F | Resp 16 | Wt 194.0 lb

## 2016-05-16 DIAGNOSIS — R339 Retention of urine, unspecified: Secondary | ICD-10-CM

## 2016-05-16 DIAGNOSIS — R109 Unspecified abdominal pain: Secondary | ICD-10-CM

## 2016-05-16 DIAGNOSIS — R1013 Epigastric pain: Secondary | ICD-10-CM | POA: Diagnosis not present

## 2016-05-16 LAB — HEPATIC FUNCTION PANEL
ALT: 10 U/L (ref 0–35)
AST: 18 U/L (ref 0–37)
Albumin: 4.6 g/dL (ref 3.5–5.2)
Alkaline Phosphatase: 49 U/L (ref 39–117)
BILIRUBIN DIRECT: 0.1 mg/dL (ref 0.0–0.3)
BILIRUBIN TOTAL: 0.5 mg/dL (ref 0.2–1.2)
Total Protein: 7 g/dL (ref 6.0–8.3)

## 2016-05-16 LAB — LIPASE: LIPASE: 20 U/L (ref 11.0–59.0)

## 2016-05-16 LAB — H. PYLORI ANTIBODY, IGG: H PYLORI IGG: NEGATIVE

## 2016-05-16 MED ORDER — SIMVASTATIN 40 MG PO TABS: ORAL_TABLET | ORAL | 1 refills | Status: DC

## 2016-05-16 MED ORDER — PANTOPRAZOLE SODIUM 40 MG PO TBEC: 40.0000 mg | DELAYED_RELEASE_TABLET | Freq: Every day | ORAL | 0 refills | Status: DC

## 2016-05-16 NOTE — Patient Instructions (Addendum)
Changing the prilosec to Protonix 40 mg daily in the morning  Abdominal ultrasound  Ordered to evaluate gallbladder; if normal.  Suspect gastric ulcer and GI referral will be made   Post void residual to evaluate bladder function   Stop the naproxen and voltaren gel

## 2016-05-16 NOTE — Assessment & Plan Note (Signed)
No improvement with myrbetriq.  Bladder ultrasound with post void residual ordered.

## 2016-05-16 NOTE — Progress Notes (Signed)
Subjective:  Patient ID: Jacqueline Kaiser, female    DOB: 02/12/1946  Age: 71 y.o. MRN: [id number]  CC: The primary encounter diagnosis was Epigastric pain. Diagnoses of Urinary retention with incomplete bladder emptying, Abdominal pain with radiation to back, and Incomplete bladder emptying were also pertinent to this visit.  HPI Jacqueline Kaiser presents for evaluation of reflux symptoms accompanied by nausea with occasional vomiting of bilious material.  Symptoms occurring post prandially , with no particular offending foot.   Was started on omeprazole 20 mg bid in November and GERD diet given.  Had a period os several days where she was symptoms free, then symptoms recur with epigastirc burning radiating to her back ,  And excessive burping . Occasionally has nausea and vomiting of bile . Bowels moving nearly daily or every other day .  Brown,  No straining or loose stools. Appetite is fine.   Uses aleve 4 out of 7 days    2) Bladder issues:   still having feeling of incomplete bladder emptying,  Not occurring every time,  mostly in the morning  . No incontinence .  Did not improve  Trial of myrbetriq.   Outpatient Medications Prior to Visit  Medication Sig Dispense Refill  . Azelastine-Fluticasone (DYMISTA) 137-50 MCG/ACT SUSP Place 1 puff into both nostrils daily.    . calcium carbonate (OS-CAL) 600 MG TABS Take 1 tablet (600 mg total) by mouth 2 (two) times daily with a meal. 180 tablet 0  . cholecalciferol (VITAMIN D) 1000 UNITS tablet Take 1,000 Units by mouth daily.    . meclizine (ANTIVERT) 12.5 MG tablet Take 1 tablet (12.5 mg total) by mouth 4 (four) times daily as needed for dizziness. 30 tablet 3  . Sodium Chloride-Sodium Bicarb (CLASSIC NETI POT SINUS WASH NA) Place into the nose. Bid    . diclofenac sodium (VOLTAREN) 1 % GEL Apply 4 g topically 4 (four) times daily. 1 Tube 3  . mirabegron ER (MYRBETRIQ) 25 MG TB24 tablet Take 1 tablet (25 mg total) by mouth daily. 30 tablet 5    . naproxen sodium (ANAPROX) 220 MG tablet Take 220 mg by mouth once.    Marland Kitchen omeprazole (PRILOSEC) 20 MG capsule Take 1 capsule (20 mg total) by mouth 2 (two) times daily before a meal. 60 capsule 2  . simvastatin (ZOCOR) 40 MG tablet TAKE 1 TABLET EVERY EVENING (APPOINTMENT NEEDED FOR REFILLS) 90 tablet 4  . alendronate (FOSAMAX) 70 MG tablet TAKE 1 TABLET EVERY 7 DAYS. TAKE WITH A FULL GLASS OF WATER ON AN EMPTY STOMACH (SEE PACKAGE FOR ADDL INSTRUCTIONS) (Patient not taking: Reported on 05/16/2016) 12 tablet 1  . diazepam (VALIUM) 5 MG tablet Take 1 tablet (5 mg total) by mouth every 12 (twelve) hours as needed for anxiety. (Patient not taking: Reported on 05/16/2016) 30 tablet 1  . fexofenadine (ALLEGRA) 180 MG tablet Take 180 mg by mouth daily.     No facility-administered medications prior to visit.     Review of Systems;  Patient denies headache, fevers, malaise, unintentional weight loss, skin rash, eye pain, sinus congestion and sinus pain, sore throat, dysphagia,  hemoptysis , cough, dyspnea, wheezing, chest pain, palpitations, orthopnea, edema, abdominal pain, nausea, melena, diarrhea, constipation, flank pain, dysuria, hematuria, urinary  Frequency, nocturia, numbness, tingling, seizures,  Focal weakness, Loss of consciousness,  Tremor, insomnia, depression, anxiety, and suicidal ideation.      Objective:  BP 110/72   Pulse 65   Temp 97.7 F (36.5 C) (Oral)  Resp 16   Wt 194 lb (88 kg)   SpO2 94%   BMI 31.31 kg/m   BP Readings from Last 3 Encounters:  05/16/16 110/72  03/21/16 108/68  02/11/16 100/66    Wt Readings from Last 3 Encounters:  05/16/16 194 lb (88 kg)  03/21/16 195 lb 12.8 oz (88.8 kg)  02/11/16 195 lb (88.5 kg)    General appearance: alert, cooperative and appears stated age Ears: normal TM's and external ear canals both ears Throat: lips, mucosa, and tongue normal; teeth and gums normal Neck: no adenopathy, no carotid bruit, supple, symmetrical,  trachea midline and thyroid not enlarged, symmetric, no tenderness/mass/nodules Back: symmetric, no curvature. ROM normal. No CVA tenderness. Lungs: clear to auscultation bilaterally Heart: regular rate and rhythm, S1, S2 normal, no murmur, click, rub or gallop Abdomen: soft, non-tender; bowel sounds normal; no masses,  no organomegaly Pulses: 2+ and symmetric Skin: Skin color, texture, turgor normal. No rashes or lesions Lymph nodes: Cervical, supraclavicular, and axillary nodes normal.  No results found for: HGBA1C  Lab Results  Component Value Date   CREATININE 0.94 02/11/2016   CREATININE 0.77 12/15/2014   CREATININE 0.9 09/30/2013    Lab Results  Component Value Date   WBC 5.8 02/11/2016   HGB 14.4 02/11/2016   HCT 42.4 02/11/2016   PLT 248.0 02/11/2016   GLUCOSE 92 02/11/2016   CHOL 196 02/11/2016   TRIG 139.0 02/11/2016   HDL 65.90 02/11/2016   LDLDIRECT 109.0 12/15/2014   LDLCALC 102 (H) 02/11/2016   ALT 10 05/16/2016   AST 18 05/16/2016   NA 141 02/11/2016   K 4.6 02/11/2016   CL 103 02/11/2016   CREATININE 0.94 02/11/2016   BUN 19 02/11/2016   CO2 32 02/11/2016   TSH 2.78 02/11/2016    Mm Screening Breast Tomo Bilateral  Result Date: 02/08/2016 CLINICAL DATA:  Screening. EXAM: 2D DIGITAL SCREENING BILATERAL MAMMOGRAM WITH CAD AND ADJUNCT TOMO COMPARISON:  Previous exam(s). ACR Breast Density Category b: There are scattered areas of fibroglandular density. FINDINGS: There are no findings suspicious for malignancy. Images were processed with CAD. IMPRESSION: No mammographic evidence of malignancy. A result letter of this screening mammogram will be mailed directly to the patient. RECOMMENDATION: Screening mammogram in one year. (Code:SM-B-01Y) BI-RADS CATEGORY  1: Negative. Electronically Signed   By: Pamelia Hoit M.D.   On: 02/08/2016 18:16    Assessment & Plan:   Problem List Items Addressed This Visit    Abdominal pain with radiation to back    Suspect  gastric ulcer given concurrent use of NSAIDS.  abd ultrasound ordered . Change omeprazole to protonix 40 mg bid      Incomplete bladder emptying    No improvement with myrbetriq.  Bladder ultrasound with post void residual ordered.        Other Visit Diagnoses    Epigastric pain    -  Primary   Relevant Orders   Hepatic function panel (Completed)   Lipase (Completed)   H. pylori antibody, IgG (Completed)   US Abdomen Complete   Urinary retention with incomplete bladder emptying       Relevant Orders   [place], retroperitnl abd,  ltd      I have discontinued Ms. O'Brien's naproxen sodium, diclofenac sodium, omeprazole, and mirabegron ER. I have also changed her simvastatin. Additionally, I am having her start on pantoprazole. Lastly, I am having her maintain her cholecalciferol, calcium carbonate, fexofenadine, Azelastine-Fluticasone, Sodium Chloride-Sodium Bicarb (CLASSIC NETI POT SINUS [facility]  NA), diazepam, meclizine, alendronate, and PROLIA.  Meds ordered this encounter  Medications  . PROLIA 60 MG/ML SOLN injection    Sig: 1 each by Subconjunctival route every 6 (six) months.  . simvastatin (ZOCOR) 40 MG tablet    Sig: TAKE 1 TABLET EVERY EVENING    Dispense:  90 tablet    Refill:  1  . pantoprazole (PROTONIX) 40 MG tablet    Sig: Take 1 tablet (40 mg total) by mouth daily.    Dispense:  30 tablet    Refill:  0    Medications Discontinued During This Encounter  Medication Reason  . simvastatin (ZOCOR) 40 MG tablet Reorder  . mirabegron ER (MYRBETRIQ) 25 MG TB24 tablet   . omeprazole (PRILOSEC) 20 MG capsule   . mirabegron ER (MYRBETRIQ) 25 MG TB24 tablet   . naproxen sodium (ANAPROX) 220 MG tablet   . diclofenac sodium (VOLTAREN) 1 % GEL     Follow-up: No Follow-up on file.   Crecencio Mc, MD

## 2016-05-16 NOTE — Assessment & Plan Note (Signed)
Suspect gastric ulcer given concurrent use of NSAIDS.  abd ultrasound ordered . Change omeprazole to protonix 40 mg bid

## 2016-05-16 NOTE — Progress Notes (Signed)
Pre visit review using our clinic review tool, if applicable. No additional management support is needed unless otherwise documented below in the visit note. 

## 2016-05-24 ENCOUNTER — Ambulatory Visit
Admission: RE | Admit: 2016-05-24 | Discharge: 2016-05-24 | Disposition: A | Discharge status: Home / Self Care | Payer: Medicare PPO | Source: Ambulatory Visit | Attending: Internal Medicine | Admitting: Internal Medicine

## 2016-05-24 DIAGNOSIS — R1013 Epigastric pain: Secondary | ICD-10-CM | POA: Diagnosis present

## 2016-05-24 DIAGNOSIS — K7689 Other specified diseases of liver: Secondary | ICD-10-CM | POA: Insufficient documentation

## 2016-05-25 ENCOUNTER — Encounter: Payer: Self-pay | Admitting: Internal Medicine

## 2016-10-03 ENCOUNTER — Other Ambulatory Visit: Payer: Self-pay | Admitting: Internal Medicine

## 2016-12-28 NOTE — Telephone Encounter (Signed)
Error

## 2017-02-16 ENCOUNTER — Encounter: Payer: Self-pay | Admitting: Internal Medicine

## 2017-02-16 ENCOUNTER — Telehealth: Payer: Self-pay | Admitting: Internal Medicine

## 2017-02-16 DIAGNOSIS — M5440 Lumbago with sciatica, unspecified side: Secondary | ICD-10-CM

## 2017-02-16 NOTE — Telephone Encounter (Signed)
Called patient states that she has been seen by Dr. Freddi Che today and was told that you need to call in steroids for back inflammation. Patient states that she has not be evaluated by you for this. States that the only person she has seen for it Is chiropractor. She is using walker do to pain. I advised that she may need evaluation before any medications can be given and she became very agitated and insisted that I send you a message and make sure I know what I am talking about.

## 2017-02-16 NOTE — Telephone Encounter (Signed)
Encounter >> Feb 16, 2017  3:24 PM Robina Ade, Helene Kelp D wrote: CRM for notification. See Telephone encounter for: 02/16/17. Patient called and stating that she just came form there chiropractic appt and there doctor there told her that she would need steroids for the inflammation from her pcp. Patient would like to know if this can be done and sent to her pharmacy [place], [place], thanks.

## 2017-02-16 NOTE — Telephone Encounter (Signed)
Copied from [place] [id number]. Topic: Quick Communication - See Telephone Encounter >> Feb 16, 2017  3:24 PM Robina Ade, Helene Kelp D wrote: CRM for notification. See Telephone encounter for: 02/16/17. Patient called and stating that she just came form there chiropractic appt and there doctor there told her that she would need steroids for the inflammation from her pcp. Patient would like to know if this can be done and sent to her pharmacy [place], [place], thanks.

## 2017-02-16 NOTE — Telephone Encounter (Signed)
Right not without seeing her ,  So you can make her an appt next availabel

## 2017-02-17 ENCOUNTER — Other Ambulatory Visit: Payer: Self-pay | Admitting: Internal Medicine

## 2017-02-17 MED ORDER — PREDNISONE 10 MG PO TABS: ORAL_TABLET | ORAL | 0 refills | Status: DC

## 2017-02-17 NOTE — Telephone Encounter (Signed)
Can you call patient to make app?   

## 2017-02-17 NOTE — Telephone Encounter (Signed)
LMTCB

## 2017-02-22 NOTE — Telephone Encounter (Signed)
Patient states that you need referral for [facility] ortho. She has an appointment next Thursday so she needs the referral for insurance purposes. She is also requesting something for her nerves because she is very anxious and not sleeping. The prednisone has helped some but not completely. She should finish up tomorrow. Please advise.  Copied from [place] [phone]. Topic: Inquiry >> Feb 22, 2017 11:25 AM Oliver Pila B wrote: Reason for CRM: PT stated that she is climbing up the wall in pain and is needing the nurse of Dr. Derrel Nip to contact her as soon as possible so that she can be advise

## 2017-03-02 ENCOUNTER — Encounter: Payer: Self-pay | Admitting: *Deleted

## 2017-03-02 ENCOUNTER — Telehealth: Payer: Self-pay | Admitting: Internal Medicine

## 2017-03-02 NOTE — Telephone Encounter (Signed)
Somehow I never saw that message.  Referral placed

## 2017-03-02 NOTE — Telephone Encounter (Signed)
Called pt regarding previous message regarding the referral that was needed through her [employer] PPO to see her Ortho. Tried to explain to her that [place] does not require referrals. She was extremely rude. She said that she called her insurance and it does require a referral. I again tried to explain to her that PPO's have NEVER required referrals and she said that she did not care what I said because she was told that it was required. Also stated that she has been with Dr. Derrel Nip for a long time and everything is continuing to go down hill because there was a mix up or confusion with medication.Marland KitchenMarland KitchenMarland KitchenI interrupted her and told her that I would call her insurance for clarification and ended the phone call. Called her insurance. She does not require a referral. Call ref #[id number]. I called patient back to let her know. Had to lvm for her to rtc

## 2017-03-02 NOTE — Telephone Encounter (Signed)
Patient is going to [place] today for an appointment she needs referral.

## 2017-03-02 NOTE — Telephone Encounter (Signed)
Mychart message sent to patient.

## 2017-03-06 ENCOUNTER — Telehealth: Payer: Self-pay | Admitting: Internal Medicine

## 2017-03-06 NOTE — Telephone Encounter (Signed)
Patient dismissed from [facility] by Deborra Medina MD , effective March 02, 2017. Dismissal letter sent out by certified / registered mail.  daj

## 2017-03-21 ENCOUNTER — Ambulatory Visit: Payer: Medicare PPO

## 2017-03-22 ENCOUNTER — Other Ambulatory Visit: Payer: Self-pay | Admitting: Internal Medicine

## 2017-03-22 DIAGNOSIS — Z1231 Encounter for screening mammogram for malignant neoplasm of breast: Secondary | ICD-10-CM

## 2017-03-30 NOTE — Telephone Encounter (Signed)
Received signed domestic return receipt verifying delivery of certified letter on March 11, 2017. Article number [id number]

## 2017-04-14 ENCOUNTER — Ambulatory Visit
Admission: RE | Admit: 2017-04-14 | Discharge: 2017-04-14 | Disposition: A | Discharge status: Home / Self Care | Payer: PPO | Source: Ambulatory Visit | Attending: Internal Medicine | Admitting: Internal Medicine

## 2017-04-14 ENCOUNTER — Other Ambulatory Visit: Payer: Self-pay | Admitting: Internal Medicine

## 2017-04-14 DIAGNOSIS — Z1231 Encounter for screening mammogram for malignant neoplasm of breast: Secondary | ICD-10-CM | POA: Diagnosis not present

## 2017-05-10 DIAGNOSIS — M810 Age-related osteoporosis without current pathological fracture: Secondary | ICD-10-CM | POA: Diagnosis not present

## 2017-05-15 ENCOUNTER — Other Ambulatory Visit: Payer: Self-pay | Admitting: Internal Medicine

## 2017-05-15 DIAGNOSIS — M810 Age-related osteoporosis without current pathological fracture: Secondary | ICD-10-CM

## 2017-05-15 DIAGNOSIS — Z8781 Personal history of (healed) traumatic fracture: Secondary | ICD-10-CM | POA: Diagnosis not present

## 2017-05-23 ENCOUNTER — Ambulatory Visit (INDEPENDENT_AMBULATORY_CARE_PROVIDER_SITE_OTHER): Payer: PPO | Admitting: Family Medicine

## 2017-05-23 ENCOUNTER — Encounter: Payer: Self-pay | Admitting: Family Medicine

## 2017-05-23 VITALS — BP 120/70 | HR 68 | Temp 98.1°F | Resp 16 | Ht 66.0 in | Wt 189.3 lb

## 2017-05-23 DIAGNOSIS — E7800 Pure hypercholesterolemia, unspecified: Secondary | ICD-10-CM | POA: Diagnosis not present

## 2017-05-23 DIAGNOSIS — K219 Gastro-esophageal reflux disease without esophagitis: Secondary | ICD-10-CM | POA: Diagnosis not present

## 2017-05-23 DIAGNOSIS — G8929 Other chronic pain: Secondary | ICD-10-CM | POA: Diagnosis not present

## 2017-05-23 DIAGNOSIS — Z23 Encounter for immunization: Secondary | ICD-10-CM | POA: Diagnosis not present

## 2017-05-23 DIAGNOSIS — I472 Ventricular tachycardia: Secondary | ICD-10-CM | POA: Diagnosis not present

## 2017-05-23 DIAGNOSIS — M545 Low back pain, unspecified: Secondary | ICD-10-CM

## 2017-05-23 DIAGNOSIS — H8110 Benign paroxysmal vertigo, unspecified ear: Secondary | ICD-10-CM | POA: Diagnosis not present

## 2017-05-23 DIAGNOSIS — F5101 Primary insomnia: Secondary | ICD-10-CM | POA: Diagnosis not present

## 2017-05-23 DIAGNOSIS — I4729 Other ventricular tachycardia: Secondary | ICD-10-CM

## 2017-05-23 DIAGNOSIS — Z9181 History of falling: Secondary | ICD-10-CM

## 2017-05-23 DIAGNOSIS — Z8781 Personal history of (healed) traumatic fracture: Secondary | ICD-10-CM | POA: Insufficient documentation

## 2017-05-23 LAB — HEPATIC FUNCTION PANEL
AG Ratio: 2 (calc) (ref 1.0–2.5)
ALBUMIN MSPROF: 4.4 g/dL (ref 3.6–5.1)
ALT: 9 U/L (ref 6–29)
AST: 15 U/L (ref 10–35)
Alkaline phosphatase (APISO): 52 U/L (ref 33–130)
BILIRUBIN DIRECT: 0.1 mg/dL (ref 0.0–0.2)
GLOBULIN: 2.2 g/dL (ref 1.9–3.7)
Indirect Bilirubin: 0.3 mg/dL (calc) (ref 0.2–1.2)
Total Bilirubin: 0.4 mg/dL (ref 0.2–1.2)
Total Protein: 6.6 g/dL (ref 6.1–8.1)

## 2017-05-23 LAB — LIPID PANEL
CHOL/HDL RATIO: 3.6 (calc) (ref <5.0)
Cholesterol: 226 mg/dL — ABNORMAL HIGH (ref <200)
HDL: 62 mg/dL (ref >50)
LDL CHOLESTEROL (CALC): 137 mg/dL — AB
NON-HDL CHOLESTEROL (CALC): 164 mg/dL — AB (ref <130)
Triglycerides: 143 mg/dL (ref <150)

## 2017-05-23 MED ORDER — TRAZODONE HCL 50 MG PO TABS: 25.0000 mg | ORAL_TABLET | Freq: Every evening | ORAL | 2 refills | Status: DC | PRN

## 2017-05-23 MED ORDER — TRAMADOL HCL 50 MG PO TABS: 50.0000 mg | ORAL_TABLET | Freq: Three times a day (TID) | ORAL | 2 refills | Status: DC | PRN

## 2017-05-23 MED ORDER — MECLIZINE HCL 12.5 MG PO TABS: 12.5000 mg | ORAL_TABLET | Freq: Four times a day (QID) | ORAL | 0 refills | Status: AC | PRN

## 2017-05-23 NOTE — Patient Instructions (Signed)
Fall Prevention in the Home Falls can cause injuries and can affect people from all age groups. There are many simple things that you can do to make your home safe and to help prevent falls. What can I do on the outside of my home?  Regularly repair the edges of walkways and driveways and fix any cracks.  Remove high doorway thresholds.  Trim any shrubbery on the main path into your home.  Use bright outdoor lighting.  Clear walkways of debris and clutter, including tools and rocks.  Regularly check that handrails are securely fastened and in good repair. Both sides of any steps should have handrails.  Install guardrails along the edges of any raised decks or porches.  Have leaves, snow, and ice cleared regularly.  Use sand or salt on walkways during winter months.  In the garage, clean up any spills right away, including grease or oil spills. What can I do in the bathroom?  Use night lights.  Install grab bars by the toilet and in the tub and shower. Do not use towel bars as grab bars.  Use non-skid mats or decals on the floor of the tub or shower.  If you need to sit down while you are in the shower, use a plastic, non-slip stool.  Keep the floor dry. Immediately clean up any water that spills on the floor.  Remove soap buildup in the tub or shower on a regular basis.  Attach bath mats securely with double-sided non-slip rug tape.  Remove throw rugs and other tripping hazards from the floor. What can I do in the bedroom?  Use night lights.  Make sure that a bedside light is easy to reach.  Do not use oversized bedding that drapes onto the floor.  Have a firm chair that has side arms to use for getting dressed.  Remove throw rugs and other tripping hazards from the floor. What can I do in the kitchen?  Clean up any spills right away.  Avoid walking on wet floors.  Place frequently used items in easy-to-reach places.  If you need to reach for something above  you, use a sturdy step stool that has a grab bar.  Keep electrical cables out of the way.  Do not use floor polish or wax that makes floors slippery. If you have to use wax, make sure that it is non-skid floor wax.  Remove throw rugs and other tripping hazards from the floor. What can I do in the stairways?  Do not leave any items on the stairs.  Make sure that there are handrails on both sides of the stairs. Fix handrails that are broken or loose. Make sure that handrails are as long as the stairways.  Check any carpeting to make sure that it is firmly attached to the stairs. Fix any carpet that is loose or worn.  Avoid having throw rugs at the top or bottom of stairways, or secure the rugs with carpet tape to prevent them from moving.  Make sure that you have a light switch at the top of the stairs and the bottom of the stairs. If you do not have them, have them installed. What are some other fall prevention tips?  Wear closed-toe shoes that fit well and support your feet. Wear shoes that have rubber soles or low heels.  When you use a stepladder, make sure that it is completely opened and that the sides are firmly locked. Have someone hold the ladder while you are using   it. Do not climb a closed stepladder.  Add color or contrast paint or tape to grab bars and handrails in your home. Place contrasting color strips on the first and last steps.  Use mobility aids as needed, such as canes, walkers, scooters, and crutches.  Turn on lights if it is dark. Replace any light bulbs that burn out.  Set up furniture so that there are clear paths. Keep the furniture in the same spot.  Fix any uneven floor surfaces.  Choose a carpet design that does not hide the edge of steps of a stairway.  Be aware of any and all pets.  Review your medicines with your healthcare provider. Some medicines can cause dizziness or changes in blood pressure, which increase your risk of falling. Talk with  your health care provider about other ways that you can decrease your risk of falls. This may include working with a physical therapist or trainer to improve your strength, balance, and endurance. This information is not intended to replace advice given to you by your health care provider. Make sure you discuss any questions you have with your health care provider. Document Released: 03/11/2002 Document Revised: 08/18/2015 Document Reviewed: 04/25/2014 Elsevier Interactive Patient Education  2018 [employer] Inc[employer]  

## 2017-05-23 NOTE — Progress Notes (Signed)
Name: Jacqueline Kaiser   MRN: [id number]    DOB: 1945-12-26   Date:05/23/2017       Progress Note  Subjective  Chief Complaint  Chief Complaint  Patient presents with  . Establish Care  . Gastroesophageal Reflux  . Hyperlipidemia    HPI  Osteoporosis history of vertebral compression fracture: she recently went to [facility], vitamin D and basic metabolic panel was normal. She is on Prolia, just finished 3rd round. She states no back pain at this time, but has intermittent back pain  Chronic lumbar spine pain: she states it has been going on for years, however fall of 2018 pain was severe and did not improve with chiropractor care, she was sent to [place] and found to have radiculitis, MRI not available for review. She was taking a lot of Naproxen and having upset stomach, she states Tylenol does not work for her.   GERD: okay at this time, she avoid caffeine and sodas. She tends to eat a healthy diet and not a lot of spices. She is under a lot of stress, daughter recently had mastectomy for treatment of breast cancer. She is taking Omeprazole but still has occasional episodes of epigastric pain, no longer has associated vomiting like one year ago. Advised to really wean self off Naproxen, we will give her Tramadol for severe pain and take Tylenol twice daily  Hyperlipidemia: taking simvastatin, denies side effects and is due for labs.   Vertigo: seen by ENT in the past, states sporadic episodes and takes prn Meclizine  Non sustain ventricular tachycardia: she denies palpitation, chest pain or decrease in exercise tolerance, seen by cardiologist many years ago, never on medication  Patient Active Problem List   Diagnosis Date Noted  . History of vertebral compression fracture 05/23/2017  . Incomplete bladder emptying 05/16/2016  . GERD (gastroesophageal reflux disease) 02/14/2016  . Insomnia due to anxiety and fear 02/14/2016  . Allergy 07/01/2013  . Osteoporosis of  lumbar spine 05/19/2013  . Overweight (BMI 25.0-29.9) 08/28/2011  . Peripheral vertigo 08/28/2011  . Nonsustained ventricular tachycardia ([place])   . Hyperlipidemia     Past Surgical History:  Procedure Laterality Date  . ABDOMINAL HYSTERECTOMY    . BREAST BIOPSY Right    negative 10-12 years ago- core  . CESAREAN SECTION    . TONSILLECTOMY      Family History  Problem Relation Age of Onset  . Heart disease Mother        ATRIAL FIB  . Cancer Father 4       Lung Ca,  nonsmoker, metastatic at diagnosis  asbestos exposure  . Breast cancer Daughter 54       dx at age of 33 and 79    Social History   Socioeconomic History  . Marital status: Married    Spouse name: Not on file  . Number of children: Not on file  . Years of education: Not on file  . Highest education level: Not on file  Social Needs  . Financial resource strain: Not on file  . Food insecurity - worry: Not on file  . Food insecurity - inability: Not on file  . Transportation needs - medical: Not on file  . Transportation needs - non-medical: Not on file  Occupational History  . Not on file  Tobacco Use  . Smoking status: Former Smoker    Packs/day: 0.50    Years: 10.00    Pack years: 5.00    Types: Cigarettes  Last attempt to quit: 08/24/1971    Years since quitting: 45.7  . Smokeless tobacco: Never Used  Substance and Sexual Activity  . Alcohol use: Yes    Comment: wine occasionally  . Drug use: No  . Sexual activity: Not Currently  Other Topics Concern  . Not on file  Social History Narrative  . Not on file     Current Outpatient Medications:  .  Ascorbic Acid (VITAMIN C) 1000 MG tablet, Take 1,000 mg by mouth daily., Disp: , Rfl:  .  Calcium 200 MG TABS, Take 1 tablet by mouth daily., Disp: , Rfl:  .  cholecalciferol (VITAMIN D) 1000 UNITS tablet, Take 1,000 Units by mouth daily., Disp: , Rfl:  .  meclizine (ANTIVERT) 12.5 MG tablet, Take 1 tablet (12.5 mg total) by mouth 4 (four) times  daily as needed for dizziness., Disp: 30 tablet, Rfl: 3 .  naproxen (NAPROSYN) 500 MG tablet, Take 1 tablet by mouth as needed., Disp: , Rfl:  .  omeprazole (PRILOSEC) 20 MG capsule, Take 20 mg by mouth 2 (two) times daily before a meal., Disp: , Rfl:  .  PROLIA 60 MG/ML SOLN injection, 1 each by Subconjunctival route every 6 (six) months., Disp: , Rfl:  .  simvastatin (ZOCOR) 40 MG tablet, TAKE 1 TABLET EVERY EVENING, Disp: 90 tablet, Rfl: 1 .  vitamin B-12 (CYANOCOBALAMIN) 1000 MCG tablet, Take 1,000 mcg by mouth daily., Disp: , Rfl:   Allergies  Allergen Reactions  . Betadine [Povidone Iodine] Other (See Comments)    Burns  . Iodine      ROS  Constitutional: Negative for fever or weight change.  Respiratory: Negative for cough and shortness of breath.   Cardiovascular: Negative for chest pain or palpitations.  Gastrointestinal: Negative for abdominal pain, no bowel changes.  Musculoskeletal: Negative for gait problem or joint swelling.  Skin: Negative for rash.  Neurological: Negative for dizziness ( but has intermittently)  or headache.  No other specific complaints in a complete review of systems (except as listed in HPI above).  Objective  Vitals:   05/23/17 1139  BP: 120/70  Pulse: 68  Resp: 16  Temp: 98.1 F (36.7 C)  TempSrc: Oral  SpO2: 96%  Weight: 189 lb 4.8 oz (85.9 kg)  Height: 5\' 6"  (1.676 m)    Body mass index is 30.55 kg/m.  Physical Exam  Constitutional: Patient appears well-developed and well-nourished. Obese  No distress.  HEENT: head atraumatic, normocephalic, pupils equal and reactive to light, neck supple, throat within normal limits Cardiovascular: Normal rate, regular rhythm and normal heart sounds.  No murmur heard. No BLE edema. Pulmonary/Chest: Effort normal and breath sounds normal. No respiratory distress. Abdominal: Soft.  There is no tenderness. Psychiatric: Patient has a normal mood and affect. behavior is normal. Judgment and  thought content normal. Muscular skeletal: some bruising on lumbar spine from recent fall, but no pain, normal rom of back   PHQ2/9: Depression screen [facility] 2/9 05/23/2017 05/23/2017 03/21/2016  Decreased Interest 0 0 0  Down, Depressed, Hopeless 0 0 0  PHQ - 2 Score 0 0 0    Fall Risk: Fall Risk  05/23/2017 05/23/2017 03/21/2016  Falls in the past year? Yes No No    Functional Status Survey: Is the patient deaf or have difficulty hearing?: No Does the patient have difficulty seeing, even when wearing glasses/contacts?: No Does the patient have difficulty concentrating, remembering, or making decisions?: No Does the patient have difficulty walking or climbing stairs?:  No Does the patient have difficulty dressing or bathing?: No Does the patient have difficulty doing errands alone such as visiting a doctor's office or shopping?: No    Assessment & Plan  1. History of vertebral compression fracture  We will obtain records from [place]   2. Need for 23-polyvalent pneumococcal polysaccharide vaccine  She left before immunization was given, we will give it to her on her next visit   3. Benign paroxysmal vertigo, unspecified laterality  - meclizine (ANTIVERT) 12.5 MG tablet; Take 1 tablet (12.5 mg total) by mouth 4 (four) times daily as needed for dizziness.  Dispense: 30 tablet; Refill: 0  4. Gastroesophageal reflux disease without esophagitis  Continue medication   5. Pure hypercholesterolemia  - Lipid panel - Hepatic function panel  6. Nonsustained ventricular tachycardia (HCC)  Resolved   7. History of recent fall  Fell on a whole in her back yard  8. Chronic midline low back pain without sciatica  Recent episode of radiculitis but that resolved with prednisone taper - traMADol (ULTRAM) 50 MG tablet; Take 1 tablet (50 mg total) by mouth every 8 (eight) hours as needed.  Dispense: 60 tablet; Refill: 2  9. Primary insomnia  - traZODone (DESYREL) 50 MG  tablet; Take 0.5-1 tablets (25-50 mg total) by mouth at bedtime as needed for sleep.  Dispense: 30 tablet; Refill: 2

## 2017-06-20 ENCOUNTER — Ambulatory Visit
Admission: RE | Admit: 2017-06-20 | Discharge: 2017-06-20 | Disposition: A | Discharge status: Home / Self Care | Payer: PPO | Source: Ambulatory Visit | Attending: Internal Medicine | Admitting: Internal Medicine

## 2017-06-20 DIAGNOSIS — M85832 Other specified disorders of bone density and structure, left forearm: Secondary | ICD-10-CM | POA: Diagnosis not present

## 2017-06-20 DIAGNOSIS — Z780 Asymptomatic menopausal state: Secondary | ICD-10-CM | POA: Diagnosis not present

## 2017-06-20 DIAGNOSIS — M810 Age-related osteoporosis without current pathological fracture: Secondary | ICD-10-CM | POA: Diagnosis not present

## 2017-07-24 ENCOUNTER — Other Ambulatory Visit: Payer: Self-pay | Admitting: Family Medicine

## 2017-07-24 DIAGNOSIS — E7800 Pure hypercholesterolemia, unspecified: Secondary | ICD-10-CM

## 2017-07-24 DIAGNOSIS — Z23 Encounter for immunization: Secondary | ICD-10-CM

## 2017-07-24 MED ORDER — ZOSTER VAC RECOMB ADJUVANTED 50 MCG/0.5ML IM SUSR: 0.5000 mL | Freq: Once | INTRAMUSCULAR | 1 refills | Status: AC

## 2017-07-24 MED ORDER — ROSUVASTATIN CALCIUM 20 MG PO TABS: 20.0000 mg | ORAL_TABLET | Freq: Every day | ORAL | 3 refills | Status: DC

## 2017-08-30 ENCOUNTER — Encounter: Payer: Self-pay | Admitting: Family Medicine

## 2017-08-30 ENCOUNTER — Ambulatory Visit (INDEPENDENT_AMBULATORY_CARE_PROVIDER_SITE_OTHER): Payer: PPO | Admitting: Family Medicine

## 2017-08-30 VITALS — BP 106/62 | HR 83 | Temp 97.9°F | Resp 16 | Ht 66.0 in | Wt 177.7 lb

## 2017-08-30 DIAGNOSIS — F5101 Primary insomnia: Secondary | ICD-10-CM

## 2017-08-30 DIAGNOSIS — Z23 Encounter for immunization: Secondary | ICD-10-CM | POA: Diagnosis not present

## 2017-08-30 DIAGNOSIS — M25531 Pain in right wrist: Secondary | ICD-10-CM | POA: Diagnosis not present

## 2017-08-30 DIAGNOSIS — E7800 Pure hypercholesterolemia, unspecified: Secondary | ICD-10-CM

## 2017-08-30 DIAGNOSIS — M545 Low back pain: Secondary | ICD-10-CM

## 2017-08-30 DIAGNOSIS — Z9181 History of falling: Secondary | ICD-10-CM

## 2017-08-30 DIAGNOSIS — G8929 Other chronic pain: Secondary | ICD-10-CM

## 2017-08-30 MED ORDER — TRAZODONE HCL 50 MG PO TABS: 50.0000 mg | ORAL_TABLET | Freq: Every evening | ORAL | 0 refills | Status: DC | PRN

## 2017-08-30 NOTE — Progress Notes (Signed)
Name: Jacqueline Kaiser   MRN: [id number]    DOB: 04-17-1945   Date:08/30/2017       Progress Note  Subjective  Chief Complaint  Chief Complaint  Patient presents with  . Medication Refill  . Hyperlipidemia  . Gastroesophageal Reflux  . Insomnia    HPI  Osteoporosis history of vertebral compression fracture: she  went to [facility], vitamin D and basic metabolic panel was normal. She is on Prolia, total of 3 rounds so far.  She states no back pain at this time, episodes of back pain are intermittent   Chronic lumbar spine pain: she states it has been going on for years, however fall of 2018 pain was severe and did not improve with chiropractor care, she was sent to [place] and found to have radiculitis, MRI not available for review. She is doing well without medication at this time  Recent Fall: she slipped at the gym, slippery floor and caught herself with her right wrist. It happened two weeks ago, still having some pain but not as intense and only when she applies pressure, discussed x-ray , she will go if symptoms do not resolve in the next 10 days.   GERD: okay at this time, she avoid caffeine and sodas. She tends to eat a healthy diet and not a lot of spices. She is under a lot of stress, daughter recently had mastectomy for treatment of breast cancer. She is taking Omeprazole but still has occasional episodes of epigastric pain, no longer has associated vomiting like one year ago. Advised to really wean self off Naproxen,  She is taking tramadol or tylenol prn. Stomach is doing well.    Patient Active Problem List   Diagnosis Date Noted  . History of vertebral compression fracture 05/23/2017  . Incomplete bladder emptying 05/16/2016  . GERD (gastroesophageal reflux disease) 02/14/2016  . Insomnia due to anxiety and fear 02/14/2016  . Allergy 07/01/2013  . Osteoporosis of lumbar spine 05/19/2013  . Overweight (BMI 25.0-29.9) 08/28/2011  . Peripheral vertigo  08/28/2011  . Nonsustained ventricular tachycardia ([place])   . Hyperlipidemia     Past Surgical History:  Procedure Laterality Date  . ABDOMINAL HYSTERECTOMY    . BREAST BIOPSY Right    negative 10-12 years ago- core  . CESAREAN SECTION    . TONSILLECTOMY      Family History  Problem Relation Age of Onset  . Heart disease Mother        ATRIAL FIB  . Cancer Father 104       Lung Ca,  nonsmoker, metastatic at diagnosis  asbestos exposure  . Breast cancer Daughter 57       dx at age of 82 and 15    Social History   Socioeconomic History  . Marital status: Married    Spouse name: Not on file  . Number of children: Not on file  . Years of education: Not on file  . Highest education level: Not on file  Occupational History  . Not on file  Social Needs  . Financial resource strain: Not on file  . Food insecurity:    Worry: Not on file    Inability: Not on file  . Transportation needs:    Medical: Not on file    Non-medical: Not on file  Tobacco Use  . Smoking status: Former Smoker    Packs/day: 0.50    Years: 10.00    Pack years: 5.00    Types: Cigarettes  Last attempt to quit: 08/24/1971    Years since quitting: 46.0  . Smokeless tobacco: Never Used  Substance and Sexual Activity  . Alcohol use: Yes    Comment: wine occasionally  . Drug use: No  . Sexual activity: Not Currently  Lifestyle  . Physical activity:    Days per week: Not on file    Minutes per session: Not on file  . Stress: Not on file  Relationships  . Social connections:    Talks on phone: Not on file    Gets together: Not on file    Attends religious service: Not on file    Active member of club or organization: Not on file    Attends meetings of clubs or organizations: Not on file    Relationship status: Not on file  . Intimate partner violence:    Fear of current or ex partner: Not on file    Emotionally abused: Not on file    Physically abused: Not on file    Forced sexual activity:  Not on file  Other Topics Concern  . Not on file  Social History Narrative  . Not on file     Current Outpatient Medications:  .  Ascorbic Acid (VITAMIN C) 1000 MG tablet, Take 1,000 mg by mouth daily., Disp: , Rfl:  .  Calcium 200 MG TABS, Take 1 tablet by mouth daily., Disp: , Rfl:  .  cholecalciferol (VITAMIN D) 1000 UNITS tablet, Take 1,000 Units by mouth daily., Disp: , Rfl:  .  meclizine (ANTIVERT) 12.5 MG tablet, Take 1 tablet (12.5 mg total) by mouth 4 (four) times daily as needed for dizziness., Disp: 30 tablet, Rfl: 0 .  naproxen (NAPROSYN) 500 MG tablet, Take 1 tablet by mouth as needed., Disp: , Rfl:  .  omeprazole (PRILOSEC) 20 MG capsule, Take 20 mg by mouth 2 (two) times daily before a meal., Disp: , Rfl:  .  PROLIA 60 MG/ML SOLN injection, 1 each by Subconjunctival route every 6 (six) months., Disp: , Rfl:  .  rosuvastatin (CRESTOR) 20 MG tablet, Take 1 tablet (20 mg total) by mouth daily., Disp: 90 tablet, Rfl: 3 .  traMADol (ULTRAM) 50 MG tablet, Take 1 tablet (50 mg total) by mouth every 8 (eight) hours as needed., Disp: 60 tablet, Rfl: 2 .  traZODone (DESYREL) 50 MG tablet, Take 1 tablet (50 mg total) by mouth at bedtime as needed for sleep., Disp: 90 tablet, Rfl: 0 .  vitamin B-12 (CYANOCOBALAMIN) 1000 MCG tablet, Take 1,000 mcg by mouth daily., Disp: , Rfl:   Allergies  Allergen Reactions  . Betadine [Povidone Iodine] Other (See Comments)    Burns  . Iodine      ROS  Constitutional: Negative for fever or significant  weight change.  Respiratory: Negative for cough and shortness of breath.   Cardiovascular: Negative for chest pain or palpitations.  Gastrointestinal: Negative for abdominal pain, no bowel changes.  Musculoskeletal: Negative for gait problem or joint swelling.  Skin: Negative for rash.  Neurological: Negative for dizziness or headache.  No other specific complaints in a complete review of systems (except as listed in HPI  above).  Objective  Vitals:   08/30/17 0906  BP: 106/62  Pulse: 83  Resp: 16  Temp: 97.9 F (36.6 C)  TempSrc: Oral  SpO2: 96%  Weight: 177 lb 11.2 oz (80.6 kg)  Height: 5\' 6"  (1.676 m)    Body mass index is 28.68 kg/m.  Physical Exam  Constitutional:  Patient appears well-developed and well-nourished. Overweight. No distress.  HEENT: head atraumatic, normocephalic, pupils equal and reactive to light, neck supple, throat within normal limits Cardiovascular: Normal rate, regular rhythm and normal heart sounds.  No murmur heard. No BLE edema. Pulmonary/Chest: Effort normal and breath sounds normal. No respiratory distress. Abdominal: Soft.  There is no tenderness. Psychiatric: Patient has a normal mood and affect. behavior is normal. Judgment and thought content normal. Muscular Skeletal: pain during palpation of right radial wrist ventrally.   PHQ2/9: Depression screen [facility] 2/9 08/30/2017 05/23/2017 05/23/2017 03/21/2016  Decreased Interest 0 0 0 0  Down, Depressed, Hopeless 0 0 0 0  PHQ - 2 Score 0 0 0 0  Altered sleeping 2 - - -  Tired, decreased energy 0 - - -  Change in appetite 0 - - -  Feeling bad or failure about yourself  0 - - -  Trouble concentrating 0 - - -  Suicidal thoughts 0 - - -  PHQ-9 Score 2 - - -  Difficult doing work/chores Not difficult at all - - -     Fall Risk: Fall Risk  08/30/2017 05/23/2017 05/23/2017 05/23/2017 03/21/2016  Falls in the past year? Yes - Yes No No  Number falls in past yr: 1 - - - -  Injury with Fall? Yes Yes - - -  Comment Right Wrist fell in gym shower - - - -  Risk Factor Category  - High Fall Risk - - -  Comment - recent fall - - -     Functional Status Survey: Is the patient deaf or have difficulty hearing?: No Does the patient have difficulty seeing, even when wearing glasses/contacts?: Yes(prescription glasses) Does the patient have difficulty concentrating, remembering, or making decisions?: No Does the patient have  difficulty walking or climbing stairs?: No Does the patient have difficulty dressing or bathing?: No Does the patient have difficulty doing errands alone such as visiting a doctor's office or shopping?: No    Assessment & Plan  1. Acute pain of right wrist  - DG Wrist Complete Right; Future  2. Need for 23-polyvalent pneumococcal polysaccharide vaccine  - Pneumococcal polysaccharide vaccine 23-valent greater than or equal to 2yo subcutaneous/IM  3. Primary insomnia  - traZODone (DESYREL) 50 MG tablet; Take 1 tablet (50 mg total) by mouth at bedtime as needed for sleep.  Dispense: 90 tablet; Refill: 0  4. Chronic midline low back pain without sciatica   5. History of recent fall  Discussed fall prevention   6. Pure hypercholesterolemia

## 2017-11-10 DIAGNOSIS — M810 Age-related osteoporosis without current pathological fracture: Secondary | ICD-10-CM | POA: Diagnosis not present

## 2018-01-04 ENCOUNTER — Ambulatory Visit (INDEPENDENT_AMBULATORY_CARE_PROVIDER_SITE_OTHER): Payer: PPO

## 2018-01-04 VITALS — BP 112/70 | HR 56 | Temp 97.6°F | Resp 12 | Ht 66.0 in | Wt 161.9 lb

## 2018-01-04 DIAGNOSIS — Z0000 Encounter for general adult medical examination without abnormal findings: Secondary | ICD-10-CM

## 2018-01-04 NOTE — Progress Notes (Signed)
Subjective:   Jacqueline Kaiser is a 72 y.o. female who presents for an Initial Medicare Annual Wellness Visit.  Review of Systems    N/A  Cardiac Risk Factors include: advanced age (>19men, >80 women);dyslipidemia     Objective:    Today's Vitals   01/04/18 1021  BP: 112/70  Pulse: (!) 56  Resp: 12  Temp: 97.6 F (36.4 C)  TempSrc: Oral  SpO2: 97%  Weight: 161 lb 14.4 oz (73.4 kg)  Height: 5\' 6"  (1.676 m)   Body mass index is 26.13 kg/m.  Advanced Directives 01/04/2018 03/21/2016  Does Patient Have a Medical Advance Directive? Yes Yes  Type of Paramedic of [place];Living will [place];Living will  Does patient want to make changes to medical advance directive? - No - Patient declined  Copy of [place] in Chart? No - copy requested Yes    Current Medications (verified) Outpatient Encounter Medications as of 01/04/2018  Medication Sig  . Ascorbic Acid (VITAMIN C) 1000 MG tablet Take 1,000 mg by mouth daily.  . Calcium 200 MG TABS Take 1 tablet by mouth daily.  . cholecalciferol (VITAMIN D) 1000 UNITS tablet Take 1,000 Units by mouth daily.  . meclizine (ANTIVERT) 12.5 MG tablet Take 1 tablet (12.5 mg total) by mouth 4 (four) times daily as needed for dizziness.  Marland Kitchen PROLIA 60 MG/ML SOLN injection 1 each by Subconjunctival route every 6 (six) months.  . rosuvastatin (CRESTOR) 20 MG tablet Take 1 tablet (20 mg total) by mouth daily.  . vitamin B-12 (CYANOCOBALAMIN) 1000 MCG tablet Take 1,000 mcg by mouth daily.  . naproxen (NAPROSYN) 500 MG tablet Take 1 tablet by mouth as needed.  Marland Kitchen omeprazole (PRILOSEC) 20 MG capsule Take 20 mg by mouth 2 (two) times daily before a meal.  . traMADol (ULTRAM) 50 MG tablet Take 1 tablet (50 mg total) by mouth every 8 (eight) hours as needed. (Patient not taking: Reported on 01/04/2018)  . traZODone (DESYREL) 50 MG tablet Take 1 tablet (50 mg total) by mouth at bedtime as needed  for sleep. (Patient not taking: Reported on 01/04/2018)   No facility-administered encounter medications on file as of 01/04/2018.     Allergies (verified) Betadine [povidone iodine] and Iodine   History: Past Medical History:  Diagnosis Date  . Hyperlipidemia   . Nonsustained ventricular tachycardia ([place])    by Holter,  noraml Stress test ECHO perpatient [name], [place])  . Osteoporosis    Past Surgical History:  Procedure Laterality Date  . ABDOMINAL HYSTERECTOMY    . BREAST BIOPSY Right    negative 10-12 years ago- core  . CESAREAN SECTION    . TONSILLECTOMY     Family History  Problem Relation Age of Onset  . Heart disease Mother        ATRIAL FIB  . Cancer Father 4       Lung Ca,  nonsmoker, metastatic at diagnosis  asbestos exposure  . Breast cancer Daughter 70       dx at age of 47 and 80   Social History   Socioeconomic History  . Marital status: Married    Spouse name: Octavia Bruckner  . Number of children: 2  . Years of education: Not on file  . Highest education level: 12th grade  Occupational History  . Occupation: Retired  Scientific laboratory technician  . Financial resource strain: Not hard at all  . Food insecurity:    Worry: Never true  Inability: Never true  . Transportation needs:    Medical: No    Non-medical: No  Tobacco Use  . Smoking status: Former Smoker    Packs/day: 0.50    Years: 10.00    Pack years: 5.00    Types: Cigarettes    Last attempt to quit: 1980    Years since quitting: 39.7  . Smokeless tobacco: Never Used  . Tobacco comment: smoking cessation materials not required  Substance and Sexual Activity  . Alcohol use: Yes    Comment: wine occasionally  . Drug use: No  . Sexual activity: Not Currently  Lifestyle  . Physical activity:    Days per week: 4 days    Minutes per session: 50 min  . Stress: Not at all  Relationships  . Social connections:    Talks on phone: Patient refused    Gets together: Patient refused    Attends religious  service: Patient refused    Active member of club or organization: Patient refused    Attends meetings of clubs or organizations: Patient refused    Relationship status: Married  Other Topics Concern  . Not on file  Social History Narrative  . Not on file    Tobacco Counseling Counseling given: No Comment: smoking cessation materials not required  Clinical Intake:  Pre-visit preparation completed: Yes  Pain : No/denies pain   BMI - recorded: 26.13 Nutritional Status: BMI 25 -29 Overweight Nutritional Risks: None Diabetes: No  How often do you need to have someone help you when you read instructions, pamphlets, or other written materials from your doctor or pharmacy?: 1 - Never  Interpreter Needed?: No  Information entered by :: AEversole, LPN   Activities of Daily Living In your present state of health, do you have any difficulty performing the following activities: 01/04/2018 08/30/2017  Hearing? N N  Comment denies hearing aids -  Vision? N Y  Comment wears eyeglasses prescription glasses  Difficulty concentrating or making decisions? N N  Walking or climbing stairs? N N  Dressing or bathing? N N  Doing errands, shopping? N N  Preparing Food and eating ? N -  Comment denies dentures -  Using the Toilet? N -  In the past six months, have you accidently leaked urine? N -  Do you have problems with loss of bowel control? N -  Managing your Medications? N -  Managing your Finances? N -  Housekeeping or managing your Housekeeping? N -  Some recent data might be hidden     Immunizations and Health Maintenance Immunization History  Administered Date(s) Administered  . Influenza Split 01/06/2012, 12/15/2012  . Influenza-Unspecified 12/04/2015, 12/18/2016  . Pneumococcal Conjugate-13 12/15/2014  . Pneumococcal Polysaccharide-23 12/15/2007, 08/30/2017  . Tdap 10/13/2013   Health Maintenance Due  Topic Date Due  . INFLUENZA VACCINE  11/02/2017    Patient Care  Team: Steele Sizer, MD as PCP - General (Family Medicine) Gabriel Carina, Betsey Holiday, MD as Physician Assistant (Endocrinology)  Indicate any recent Medical Services you may have received from other than [facility] providers in the past year (date may be approximate).     Assessment:   This is a routine wellness examination for [facility].  Hearing/Vision screen Vision Screening Comments: [facility]es [facility] for annual [facility] exams  Dietary issues and exercise activities discussed: Current Exercise Habits: The patient does not participate in regular exercise at present;Structured exercise class, Type of exercise: walking;Other - see comments(water aerobics), Time (Minutes): 50, Frequency (Times/Week): 4, Weekly  Exercise (Minutes/Week): 200, Intensity: Mild, Exercise limited by: None identified  Goals    . DIET - INCREASE WATER INTAKE     Recommend to drink at least 6-8 8oz glasses of water per day.    . Increase physical activity     Water aerobic classes 3 times a week      Depression Screen PHQ 2/9 Scores 01/04/2018 08/30/2017 05/23/2017 05/23/2017 03/21/2016  PHQ - 2 Score 0 0 0 0 0  PHQ- 9 Score 0 2 - - -    Fall Risk Fall Risk  01/04/2018 08/30/2017 05/23/2017 05/23/2017 05/23/2017  Falls in the past year? No Yes - Yes No  Number falls in past yr: - 1 - - -  Injury with Fall? - Yes Yes - -  Comment - Right Wrist fell in gym shower - - -  Risk Factor Category  - - High Fall Risk - -  Comment - - recent fall - -  Risk for fall due to : Impaired vision - - - -  Risk for fall due to: Comment wears eyeglasses - - - -    FALL RISK PREVENTION PERTAINING TO THE HOME:  Any stairs in or around the home WITH handrails? Yes  Home free of loose throw rugs in walkways, pet beds, electrical cords, etc? Yes  Adequate lighting in your home to reduce risk of falls? Yes   ASSISTIVE DEVICES UTILIZED TO PREVENT FALLS:  Life alert? No  Use of a cane, walker or w/c? No  Grab bars in the bathroom? No  Shower chair  or bench in shower? No  Elevated toilet seat or a handicapped toilet? No   DME ORDERS:  DME order needed?  Yes   TIMED UP AND GO:  Was the test performed? Yes .  Length of time to ambulate 10 feet: 5 sec.   GAIT:  Appearance of gait: Gait stead-fast and without the use of an assistive device.  Education: Fall risk prevention has been discussed.  Intervention(s) required? No   Cognitive Function:     6CIT Screen 01/04/2018 03/21/2016  What Year? 0 points 0 points  What month? 0 points 0 points  What time? 0 points 0 points  Count back from 20 0 points 0 points  Months in reverse 0 points 0 points  Repeat phrase 4 points -  Total Score 4 -    Screening Tests Health Maintenance  Topic Date Due  . INFLUENZA VACCINE  11/02/2017  . MAMMOGRAM  04/14/2018  . COLONOSCOPY  10/23/2020  . TETANUS/TDAP  10/14/2023  . DEXA SCAN  Completed  . Hepatitis C Screening  Completed  . PNA vac Low Risk Adult  Completed    Qualifies for Shingles Vaccine? Yes . Due for Shingrix. Education has been provided regarding the importance of this vaccine. Pt has been advised to call insurance company to determine out of pocket expense. Advised may also receive vaccine at local pharmacy or Health Dept. Verbalized acceptance and understanding.  Flu Vaccine: Pt states she recently received her flu vaccine from [facility]. Asked pt to provide our office with a copy of her vaccine record. Verbalized acceptance and understanding.  Cancer Screenings:  Colorectal Screening: Completed 10/24/10. Repeat every 10 years.  Mammogram: Completed 04/14/17. Repeat every year  Bone Density: Completed 06/20/17. Results reflect OSTEOPENIA. Repeat every 2 years.   Lung Cancer Screening: (Low Dose CT Chest recommended if Age 37-80 years, 30 pack-year currently smoking OR have quit w/in 15years.) does not qualify.  Additional Screening:  Hepatitis C Screening: Completed 12/15/14  Vision Screening: Recommended annual  ophthalmology exams for early detection of glaucoma and other disorders of the eye. Is the patient up to date with their annual eye exam?  No  Who is the provider or what is the name of the office in which the pt attends annual eye exams? [facility] Declined my offer to assist with scheduling an updated eye exam.  Dental Screening: Recommended annual dental exams for proper oral hygiene  Community Resource Referral:  CRR required this visit?  No    Plan:  I have personally reviewed and addressed the Medicare Annual Wellness questionnaire and have noted the following in the patient's chart:  A. Medical and social history B. Use of alcohol, tobacco or illicit drugs  C. Current medications and supplements D. Functional ability and status E.  Nutritional status F.  Physical activity G. Advance directives H. List of other physicians I.  Hospitalizations, surgeries, and ER visits in previous 12 months J.  [place] such as hearing and vision if needed, cognitive and depression L. Referrals and appointments  In addition, I have reviewed and discussed with patient certain preventive protocols, quality metrics, and best practice recommendations. A written personalized care plan for preventive services as well as general preventive health recommendations were provided to patient.  See attached scanned questionnaire for additional information.   Signed,  Aleatha Borer, LPN Nurse Health Advisor

## 2018-01-04 NOTE — Patient Instructions (Signed)
Ms. Jacqueline Kaiser , Thank you for taking time to come for your Medicare Wellness Visit. I appreciate your ongoing commitment to your health goals. Please review the following plan we discussed and let me know if I can assist you in the future.   Screening recommendations/referrals: Colorectal Screening: Up to date Mammogram: Up to date Bone Density: Up to date  Vision and Dental Exams: Recommended annual ophthalmology exams for early detection of glaucoma and other disorders of the eye Recommended annual dental exams for proper oral hygiene  Vaccinations: Influenza vaccine: Please provide a copy of your vaccine record Pneumococcal vaccine: Up to date Tdap vaccine: Up to date Shingles vaccine: Please call your insurance company to determine your out of pocket expense for the Shingrix vaccine. You may receive this vaccine at your local pharmacy.  Advanced directives: Please bring a copy of your POA (Power of Attorney) and/or Living Will to your next appointment.  Goals: Recommend to drink at least 6-8 8oz glasses of water per day.  Next appointment: Please schedule your Annual Wellness Visit with your Nurse Health Advisor in one year.  Preventive Care 17 Years and Older, Female Preventive care refers to lifestyle choices and visits with your health care provider that can promote health and wellness. What does preventive care include?  A yearly physical exam. This is also called an annual well check.  Dental exams once or twice a year.  Routine eye exams. Ask your health care provider how often you should have your eyes checked.  Personal lifestyle choices, including:  Daily care of your teeth and gums.  Regular physical activity.  Eating a healthy diet.  Avoiding tobacco and drug use.  Limiting alcohol use.  Practicing safe sex.  Taking low-dose aspirin every day if recommended by your health care provider.  Taking vitamin and mineral supplements as recommended by your  health care provider. What happens during an annual well check? The services and screenings done by your health care provider during your annual well check will depend on your age, overall health, lifestyle risk factors, and family history of disease. Counseling  Your health care provider may ask you questions about your:  Alcohol use.  Tobacco use.  Drug use.  Emotional well-being.  Home and relationship well-being.  Sexual activity.  Eating habits.  History of falls.  Memory and ability to understand (cognition).  Work and work Statistician.  Reproductive health. Screening  You may have the following tests or measurements:  Height, weight, and BMI.  Blood pressure.  Lipid and cholesterol levels. These may be checked every 5 years, or more frequently if you are over 1 years old.  Skin check.  Lung cancer screening. You may have this screening every year starting at age 42 if you have a 30-pack-year history of smoking and currently smoke or have quit within the past 15 years.  Fecal occult blood test (FOBT) of the stool. You may have this test every year starting at age 48.  Flexible sigmoidoscopy or colonoscopy. You may have a sigmoidoscopy every 5 years or a colonoscopy every 10 years starting at age 49.  Hepatitis C blood test.  Hepatitis B blood test.  Sexually transmitted disease (STD) testing.  Diabetes screening. This is done by checking your blood sugar (glucose) after you have not eaten for a while (fasting). You may have this done every 1-3 years.  Bone density scan. This is done to screen for osteoporosis. You may have this done starting at age 48.  Mammogram.  This may be done every 1-2 years. Talk to your health care provider about how often you should have regular mammograms. Talk with your health care provider about your test results, treatment options, and if necessary, the need for more tests. Vaccines  Your health care provider may recommend  certain vaccines, such as:  Influenza vaccine. This is recommended every year.  Tetanus, diphtheria, and acellular pertussis (Tdap, Td) vaccine. You may need a Td booster every 10 years.  Zoster vaccine. You may need this after age 38.  Pneumococcal 13-valent conjugate (PCV13) vaccine. One dose is recommended after age 12.  Pneumococcal polysaccharide (PPSV23) vaccine. One dose is recommended after age 72. Talk to your health care provider about which screenings and vaccines you need and how often you need them. This information is not intended to replace advice given to you by your health care provider. Make sure you discuss any questions you have with your health care provider. Document Released: 04/17/2015 Document Revised: 12/09/2015 Document Reviewed: 01/20/2015 Elsevier Interactive Patient Education  2017 [place] Prevention in the Home Falls can cause injuries. They can happen to people of all ages. There are many things you can do to make your home safe and to help prevent falls. What can I do on the outside of my home?  Regularly fix the edges of walkways and driveways and fix any cracks.  Remove anything that might make you trip as you walk through a door, such as a raised step or threshold.  Trim any bushes or trees on the path to your home.  Use bright outdoor lighting.  Clear any walking paths of anything that might make someone trip, such as rocks or tools.  Regularly check to see if handrails are loose or broken. Make sure that both sides of any steps have handrails.  Any raised decks and porches should have guardrails on the edges.  Have any leaves, snow, or ice cleared regularly.  Use sand or salt on walking paths during winter.  Clean up any spills in your garage right away. This includes oil or grease spills. What can I do in the bathroom?  Use night lights.  Install grab bars by the toilet and in the tub and shower. Do not use towel bars as  grab bars.  Use non-skid mats or decals in the tub or shower.  If you need to sit down in the shower, use a plastic, non-slip stool.  Keep the floor dry. Clean up any water that spills on the floor as soon as it happens.  Remove soap buildup in the tub or shower regularly.  Attach bath mats securely with double-sided non-slip rug tape.  Do not have throw rugs and other things on the floor that can make you trip. What can I do in the bedroom?  Use night lights.  Make sure that you have a light by your bed that is easy to reach.  Do not use any sheets or blankets that are too big for your bed. They should not hang down onto the floor.  Have a firm chair that has side arms. You can use this for support while you get dressed.  Do not have throw rugs and other things on the floor that can make you trip. What can I do in the kitchen?  Clean up any spills right away.  Avoid walking on wet floors.  Keep items that you use a lot in easy-to-reach places.  If you need to reach something  above you, use a strong step stool that has a grab bar.  Keep electrical cords out of the way.  Do not use floor polish or wax that makes floors slippery. If you must use wax, use non-skid floor wax.  Do not have throw rugs and other things on the floor that can make you trip. What can I do with my stairs?  Do not leave any items on the stairs.  Make sure that there are handrails on both sides of the stairs and use them. Fix handrails that are broken or loose. Make sure that handrails are as long as the stairways.  Check any carpeting to make sure that it is firmly attached to the stairs. Fix any carpet that is loose or worn.  Avoid having throw rugs at the top or bottom of the stairs. If you do have throw rugs, attach them to the floor with carpet tape.  Make sure that you have a light switch at the top of the stairs and the bottom of the stairs. If you do not have them, ask someone to add them  for you. What else can I do to help prevent falls?  Wear shoes that:  Do not have high heels.  Have rubber bottoms.  Are comfortable and fit you well.  Are closed at the toe. Do not wear sandals.  If you use a stepladder:  Make sure that it is fully opened. Do not climb a closed stepladder.  Make sure that both sides of the stepladder are locked into place.  Ask someone to hold it for you, if possible.  Clearly mark and make sure that you can see:  Any grab bars or handrails.  First and last steps.  Where the edge of each step is.  Use tools that help you move around (mobility aids) if they are needed. These include:  Canes.  Walkers.  Scooters.  Crutches.  Turn on the lights when you go into a dark area. Replace any light bulbs as soon as they burn out.  Set up your furniture so you have a clear path. Avoid moving your furniture around.  If any of your floors are uneven, fix them.  If there are any pets around you, be aware of where they are.  Review your medicines with your doctor. Some medicines can make you feel dizzy. This can increase your chance of falling. Ask your doctor what other things that you can do to help prevent falls. This information is not intended to replace advice given to you by your health care provider. Make sure you discuss any questions you have with your health care provider. Document Released: 01/15/2009 Document Revised: 08/27/2015 Document Reviewed: 04/25/2014 Elsevier Interactive Patient Education  2017 [employer].

## 2018-01-08 ENCOUNTER — Ambulatory Visit (INDEPENDENT_AMBULATORY_CARE_PROVIDER_SITE_OTHER): Payer: PPO | Admitting: Family Medicine

## 2018-01-08 ENCOUNTER — Encounter: Payer: Self-pay | Admitting: Family Medicine

## 2018-01-08 VITALS — BP 136/74 | HR 74 | Temp 98.5°F | Resp 16 | Ht 66.0 in | Wt 163.8 lb

## 2018-01-08 DIAGNOSIS — M810 Age-related osteoporosis without current pathological fracture: Secondary | ICD-10-CM | POA: Diagnosis not present

## 2018-01-08 DIAGNOSIS — K219 Gastro-esophageal reflux disease without esophagitis: Secondary | ICD-10-CM

## 2018-01-08 DIAGNOSIS — E7800 Pure hypercholesterolemia, unspecified: Secondary | ICD-10-CM | POA: Diagnosis not present

## 2018-01-08 DIAGNOSIS — H8110 Benign paroxysmal vertigo, unspecified ear: Secondary | ICD-10-CM | POA: Diagnosis not present

## 2018-01-08 DIAGNOSIS — F5101 Primary insomnia: Secondary | ICD-10-CM

## 2018-01-08 MED ORDER — TRAZODONE HCL 50 MG PO TABS: 50.0000 mg | ORAL_TABLET | Freq: Every evening | ORAL | 1 refills | Status: DC | PRN

## 2018-01-08 NOTE — Progress Notes (Signed)
Name: Jacqueline Kaiser   MRN: [id number]    DOB: 1946/03/11   Date:01/09/2018       Progress Note  Subjective  Chief Complaint  Chief Complaint  Patient presents with  . Follow-up    6 mth f/u  . Gastroesophageal Reflux  . Back Pain  . Osteoporosis  . Insomnia  . Hyperlipidemia  . Fall    HPI    Osteoporosis history of vertebral compression fracture: she  went to [facility], vitamin D and basic metabolic panel was normal. She is still getting Prolia with Dr. Gabriel Carina and denies side effects of back pain  Chronic lumbar spine pain: she states it has been going on for years, however fall of 2018 pain was severe and did not improve with chiropractor care, she was sent to [place] and found to have radiculitis, MRI not available for review. She is doing well without medication at this time. She only takes Naproxen sporadically for aches and pains now  Recent Fall: she had two falls this year, she states she does not get dizzy, she states " accidents " no injury this time. Discussed importance of closed toe shoes to avoid any more falls, look down when walking.   GERD: currently only taking medication prn, stress level is down and she eats healthy.   Hyperlipidemia: taking Crestor, denies myalgia, chest pain or palpitation.   Insomnia: she states she falls asleep without problems, but tends to wake up in the middle of the night to void and moves to the living room where she turns on the TV and falls back asleep within 10-20 minutes. She states she takes Trazodone occasionally but would like a refill.    Patient Active Problem List   Diagnosis Date Noted  . Age-related osteoporosis without current pathological fracture 01/08/2018  . History of vertebral compression fracture 05/23/2017  . Incomplete bladder emptying 05/16/2016  . GERD (gastroesophageal reflux disease) 02/14/2016  . Insomnia due to anxiety and fear 02/14/2016  . Allergy 07/01/2013  . Osteoporosis of  lumbar spine 05/19/2013  . Overweight (BMI 25.0-29.9) 08/28/2011  . Peripheral vertigo 08/28/2011  . Nonsustained ventricular tachycardia ([place])   . Hyperlipidemia     Past Surgical History:  Procedure Laterality Date  . ABDOMINAL HYSTERECTOMY    . BREAST BIOPSY Right    negative 10-12 years ago- core  . CESAREAN SECTION    . TONSILLECTOMY      Family History  Problem Relation Age of Onset  . Heart disease Mother        ATRIAL FIB  . Cancer Father 90       Lung Ca,  nonsmoker, metastatic at diagnosis  asbestos exposure  . Breast cancer Daughter 71       dx at age of 57 and 51    Social History   Socioeconomic History  . Marital status: Married    Spouse name: Octavia Bruckner  . Number of children: 2  . Years of education: Not on file  . Highest education level: 12th grade  Occupational History  . Occupation: Retired  Scientific laboratory technician  . Financial resource strain: Not hard at all  . Food insecurity:    Worry: Never true    Inability: Never true  . Transportation needs:    Medical: No    Non-medical: No  Tobacco Use  . Smoking status: Former Smoker    Packs/day: 0.50    Years: 10.00    Pack years: 5.00    Types: Cigarettes  Last attempt to quit: 1980    Years since quitting: 39.7  . Smokeless tobacco: Never Used  . Tobacco comment: smoking cessation materials not required  Substance and Sexual Activity  . Alcohol use: Yes    Comment: wine occasionally  . Drug use: No  . Sexual activity: Not Currently  Lifestyle  . Physical activity:    Days per week: 4 days    Minutes per session: 50 min  . Stress: Not at all  Relationships  . Social connections:    Talks on phone: Patient refused    Gets together: Patient refused    Attends religious service: Patient refused    Active member of club or organization: Patient refused    Attends meetings of clubs or organizations: Patient refused    Relationship status: Married  . Intimate partner violence:    Fear of current or  ex partner: No    Emotionally abused: No    Physically abused: No    Forced sexual activity: No  Other Topics Concern  . Not on file  Social History Narrative  . Not on file     Current Outpatient Medications:  .  Ascorbic Acid (VITAMIN C) 1000 MG tablet, Take 1,000 mg by mouth daily., Disp: , Rfl:  .  Calcium 200 MG TABS, Take 1 tablet by mouth daily., Disp: , Rfl:  .  cholecalciferol (VITAMIN D) 1000 UNITS tablet, Take 1,000 Units by mouth daily., Disp: , Rfl:  .  meclizine (ANTIVERT) 12.5 MG tablet, Take 1 tablet (12.5 mg total) by mouth 4 (four) times daily as needed for dizziness., Disp: 30 tablet, Rfl: 0 .  PROLIA 60 MG/ML SOLN injection, 1 each by Subconjunctival route every 6 (six) months., Disp: , Rfl:  .  rosuvastatin (CRESTOR) 20 MG tablet, Take 1 tablet (20 mg total) by mouth daily., Disp: 90 tablet, Rfl: 3 .  traZODone (DESYREL) 50 MG tablet, Take 1 tablet (50 mg total) by mouth at bedtime as needed for sleep., Disp: 90 tablet, Rfl: 1 .  vitamin B-12 (CYANOCOBALAMIN) 1000 MCG tablet, Take 1,000 mcg by mouth daily., Disp: , Rfl:   Allergies  Allergen Reactions  . Betadine [Povidone Iodine] Other (See Comments)    Burns  . Iodine     I personally reviewed active problem list, medication list, allergies, family history, social history with the patient/caregiver today.   ROS  Constitutional: Negative for fever or weight change.  Respiratory: Negative for cough and shortness of breath.   Cardiovascular: Negative for chest pain or palpitations.  Gastrointestinal: Negative for abdominal pain, no bowel changes.  Musculoskeletal: Negative for gait problem or joint swelling.  Skin: Negative for rash.  Neurological: Negative for dizziness or headache.  No other specific complaints in a complete review of systems (except as listed in HPI above).  Objective  Vitals:   01/08/18 1200  BP: 136/74  Pulse: 74  Resp: 16  Temp: 98.5 F (36.9 C)  TempSrc: Oral  SpO2: 96%   Weight: 163 lb 12.8 oz (74.3 kg)  Height: 5\' 6"  (1.676 m)    Body mass index is 26.44 kg/m.  Physical Exam  Constitutional: Patient appears well-developed and well-nourished. Overweight.  No distress.  HEENT: head atraumatic, normocephalic, pupils equal and reactive to light,  neck supple, throat within normal limits Cardiovascular: Normal rate, regular rhythm and normal heart sounds.  No murmur heard. No BLE edema. Pulmonary/Chest: Effort normal and breath sounds normal. No respiratory distress. Abdominal: Soft.  There is no  tenderness. Psychiatric: Patient has a normal mood and affect. behavior is normal. Judgment and thought content normal.  PHQ2/9: Depression screen [facility] 2/9 01/08/2018 01/04/2018 08/30/2017 05/23/2017 05/23/2017  Decreased Interest 0 0 0 0 0  Down, Depressed, Hopeless 0 0 0 0 0  PHQ - 2 Score 0 0 0 0 0  Altered sleeping 2 0 2 - -  Tired, decreased energy 0 0 0 - -  Change in appetite 0 0 0 - -  Feeling bad or failure about yourself  0 0 0 - -  Trouble concentrating 0 0 0 - -  Moving slowly or fidgety/restless 0 0 - - -  Suicidal thoughts 0 0 0 - -  PHQ-9 Score 2 0 2 - -  Difficult doing work/chores Not difficult at all Not difficult at all Not difficult at all - -     Fall Risk: Fall Risk  01/08/2018 01/04/2018 08/30/2017 05/23/2017 05/23/2017  Falls in the past year? Yes No Yes - Yes  Number falls in past yr: 2 or more - 1 - -  Comment February 2019 - - - -  Injury with Fall? (No Data) - Yes Yes -  Comment Hurt her wrist - Right Wrist fell in gym shower - -  Risk Factor Category  - - - High Fall Risk -  Comment - - - recent fall -  Risk for fall due to : History of fall(s) Impaired vision - - -  Risk for fall due to: Comment - wears eyeglasses - - -  Follow up Falls prevention discussed - - - -    Assessment & Plan   1. Pure hypercholesterolemia  - Lipid panel Continue crestor   2. Primary insomnia  - traZODone (DESYREL) 50 MG tablet; Take 1  tablet (50 mg total) by mouth at bedtime as needed for sleep.  Dispense: 90 tablet; Refill: 1  3. Benign paroxysmal vertigo, unspecified laterality   4. Gastroesophageal reflux disease without esophagitis  Taking Tums prn   5. Age-related osteoporosis without current pathological fracture  - COMPLETE METABOLIC PANEL WITH GFR - CBC with Differential/Platelet - VITAMIN D 25 Hydroxy (Vit-D Deficiency, Fractures)

## 2018-01-09 LAB — COMPLETE METABOLIC PANEL WITH GFR
AG RATIO: 2.2 (calc) (ref 1.0–2.5)
ALT: 9 U/L (ref 6–29)
AST: 18 U/L (ref 10–35)
Albumin: 4.6 g/dL (ref 3.6–5.1)
Alkaline phosphatase (APISO): 59 U/L (ref 33–130)
BILIRUBIN TOTAL: 0.4 mg/dL (ref 0.2–1.2)
BUN: 16 mg/dL (ref 7–25)
CALCIUM: 9.9 mg/dL (ref 8.6–10.4)
CHLORIDE: 102 mmol/L (ref 98–110)
CO2: 29 mmol/L (ref 20–32)
Creat: 0.88 mg/dL (ref 0.60–0.93)
GFR, EST AFRICAN AMERICAN: 75 mL/min/{1.73_m2} (ref >=60)
GFR, Est Non African American: 65 mL/min/{1.73_m2} (ref >=60)
GLUCOSE: 84 mg/dL (ref 65–139)
Globulin: 2.1 g/dL (calc) (ref 1.9–3.7)
POTASSIUM: 4.5 mmol/L (ref 3.5–5.3)
Sodium: 139 mmol/L (ref 135–146)
TOTAL PROTEIN: 6.7 g/dL (ref 6.1–8.1)

## 2018-01-09 LAB — LIPID PANEL
CHOLESTEROL: 246 mg/dL — AB (ref <200)
HDL: 79 mg/dL (ref >50)
LDL Cholesterol (Calc): 149 mg/dL (calc) — ABNORMAL HIGH
NON-HDL CHOLESTEROL (CALC): 167 mg/dL — AB (ref <130)
TRIGLYCERIDES: 81 mg/dL (ref <150)
Total CHOL/HDL Ratio: 3.1 (calc) (ref <5.0)

## 2018-01-09 LAB — CBC WITH DIFFERENTIAL/PLATELET
BASOS ABS: 40 {cells}/uL (ref 0–200)
Basophils Relative: 0.7 %
EOS ABS: 131 {cells}/uL (ref 15–500)
Eosinophils Relative: 2.3 %
HEMATOCRIT: 42.9 % (ref 35.0–45.0)
HEMOGLOBIN: 14.6 g/dL (ref 11.7–15.5)
Lymphs Abs: 1801 cells/uL (ref 850–3900)
MCH: 31.1 pg (ref 27.0–33.0)
MCHC: 34 g/dL (ref 32.0–36.0)
MCV: 91.5 fL (ref 80.0–100.0)
MPV: 9.4 fL (ref 7.5–12.5)
Monocytes Relative: 7.4 %
NEUTROS ABS: 3306 {cells}/uL (ref 1500–7800)
Neutrophils Relative %: 58 %
Platelets: 240 10*3/uL (ref 140–400)
RBC: 4.69 10*6/uL (ref 3.80–5.10)
RDW: 12 % (ref 11.0–15.0)
Total Lymphocyte: 31.6 %
WBC: 5.7 10*3/uL (ref 3.8–10.8)
WBCMIX: 422 {cells}/uL (ref 200–950)

## 2018-01-09 LAB — VITAMIN D 25 HYDROXY (VIT D DEFICIENCY, FRACTURES): Vit D, 25-Hydroxy: 52 ng/mL (ref 30–100)

## 2018-02-05 ENCOUNTER — Telehealth: Payer: Self-pay | Admitting: Family Medicine

## 2018-02-05 DIAGNOSIS — Z1211 Encounter for screening for malignant neoplasm of colon: Secondary | ICD-10-CM

## 2018-02-05 NOTE — Telephone Encounter (Signed)
Patient said that someone called her and said that she needed to have a colonscopy and she is electing to have the color guard where they mail to her. Jacqueline Kaiser the person that called her never came back on the phone . Please set this up for her.

## 2018-02-17 DIAGNOSIS — Z1211 Encounter for screening for malignant neoplasm of colon: Secondary | ICD-10-CM | POA: Diagnosis not present

## 2018-02-22 LAB — COLOGUARD: COLOGUARD: NEGATIVE

## 2018-02-23 ENCOUNTER — Encounter: Payer: Self-pay | Admitting: Family Medicine

## 2018-02-23 ENCOUNTER — Telehealth: Payer: Self-pay

## 2018-02-23 NOTE — Telephone Encounter (Signed)
Called and patient was unavailable left a voicemail for patient to call back regarding her results.  Cologuard was Negative. Does not need another one for 3 years.

## 2018-02-28 ENCOUNTER — Telehealth: Payer: Self-pay | Admitting: *Deleted

## 2018-02-28 NOTE — Telephone Encounter (Signed)
Reviewed Cologuard results and physician's note with patient. No questions asked. No results note sent to [facility].

## 2018-04-23 NOTE — Progress Notes (Addendum)
04/24/2018  11:03 AM   Jacqueline Kaiser 01/19/1946 [id number]  Referring provider: Steele Sizer, MD [address] [place], [place] [address]  Chief Complaint  Patient presents with  . Urinary Frequency   HPI: Jacqueline Kaiser is a 73 y.o. White or Caucasian female that presents today to establish care with complaints of urinary frequency and urgency.  She presents that she has been experiencing significant urgency without incontinence for the past year which has worsened over time. She experiences nocturia x 1 occasionally when she has beverages closer to bedtime. She admits that she has limited her fluid intake close to bedtime.   She reports her symptoms are most active/bothersome throughout the day. Although, she is most likely to experience voids within 15 minutes of each other in the mornings.  When she finishes urinating she admits that she feels that she has completely emptied until she experiences urgency again shortly after. She has not attempted to implement the Cred maneuver.  She denies dysuria, gross hematuria, flank pain, suprapubic pain, pelvic pain, and abdominal pain. Denies vaginal dryness, vaginal bulging, vaginal pressure and pain with intercourse.  She reports no change in her symptoms with caffeine intake/withdrawal.   She voided immediately before entering room and needed to void again shortly after. Following void her PVR was 206 ml.   She has no history of diabetes.  She has taken Myrbetriq in the past under Dr. Derrel Nip in 2018 with no symptom improvement. Although, patient admits she didn't complete the medication she was given.  She has had a C-section and atraumatic vaginal delivery x 1.   No recent history of UTIs.  PMH: Past Medical History:  Diagnosis Date  . Hyperlipidemia   . Nonsustained ventricular tachycardia ([place])    by Holter,  noraml Stress test ECHO perpatient [name], [place])  . Osteoporosis    Surgical History: Past  Surgical History:  Procedure Laterality Date  . ABDOMINAL HYSTERECTOMY    . BREAST BIOPSY Right    negative 10-12 years ago- core  . CESAREAN SECTION    . TONSILLECTOMY     Home Medications:  Allergies as of 04/24/2018      Reactions   Betadine [povidone Iodine] Other (See Comments)   Burns   Iodine       Medication List       Accurate as of April 24, 2018 11:03 AM. Always use your most recent med list.        Calcium 200 MG Tabs Take 1 tablet by mouth daily.   cholecalciferol 1000 units tablet Commonly known as:  VITAMIN D Take 1,000 Units by mouth daily.   meclizine 12.5 MG tablet Commonly known as:  ANTIVERT Take 1 tablet (12.5 mg total) by mouth 4 (four) times daily as needed for dizziness.   PROLIA 60 MG/ML Soln injection Generic drug:  denosumab 1 each by Subconjunctival route every 6 (six) months.   rosuvastatin 20 MG tablet Commonly known as:  CRESTOR Take 1 tablet (20 mg total) by mouth daily.   traZODone 50 MG tablet Commonly known as:  DESYREL Take 1 tablet (50 mg total) by mouth at bedtime as needed for sleep.   vitamin B-12 1000 MCG tablet Commonly known as:  CYANOCOBALAMIN Take 1,000 mcg by mouth daily.   vitamin C 1000 MG tablet Take 1,000 mg by mouth daily.       Allergies:  Allergies  Allergen Reactions  . Betadine [Povidone Iodine] Other (See Comments)    Burns  .  Iodine     Family History: Family History  Problem Relation Age of Onset  . Heart disease Mother        ATRIAL FIB  . Cancer Father 17       Lung Ca,  nonsmoker, metastatic at diagnosis  asbestos exposure  . Breast cancer Daughter 49       dx at age of 19 and 10    Social History:  reports that she quit smoking about 40 years ago. Her smoking use included cigarettes. She has a 5.00 pack-year smoking history. She has never used smokeless tobacco. She reports current alcohol use. She reports that she does not use drugs.  ROS: UROLOGY Frequent Urination?:  Yes Hard to postpone urination?: Yes Burning/pain with urination?: No Get up at night to urinate?: Yes Leakage of urine?: No Urine stream starts and stops?: No Trouble starting stream?: No Do you have to strain to urinate?: No Blood in urine?: No Urinary tract infection?: No Sexually transmitted disease?: No Injury to kidneys or bladder?: No Painful intercourse?: No Weak stream?: No Currently pregnant?: No Vaginal bleeding?: No Last menstrual period?: n  Gastrointestinal Nausea?: No Vomiting?: No Indigestion/heartburn?: No Diarrhea?: No Constipation?: No  Constitutional Fever: No Night sweats?: No Weight loss?: No Fatigue?: No  Skin Skin rash/lesions?: No Itching?: No  Eyes Blurred vision?: No Double vision?: No  Ears/Nose/Throat Sore throat?: No Sinus problems?: No  Hematologic/Lymphatic Swollen glands?: No Easy bruising?: No  Cardiovascular Leg swelling?: No Chest pain?: No  Respiratory Cough?: No Shortness of breath?: No  Endocrine Excessive thirst?: No  Musculoskeletal Back pain?: No Joint pain?: No  Neurological Headaches?: No Dizziness?: No  Psychologic Depression?: No Anxiety?: No  Physical Exam: BP 116/77 (BP Location: Left Arm, Patient Position: Sitting, Cuff Size: Normal)   Pulse 69   Ht 5\' 6"  (1.676 m)   Wt 168 lb 9.6 oz (76.5 kg)   BMI 27.21 kg/m   Vital signs reviewed. Exam conducted with chaperone present. Constitutional:  Well nourished. Alert and oriented, No acute distress. Respiratory: Normal respiratory effort, no increased work of breathing. GI: No hernias appreciated.  GU/Pelvic: Normal external genitalia. Hypermobile urethral meatus. Poor pelvic support, Grade 2 cystocele with valsava and Stage 1 rectocele noted with apical decent. Atrophic vaginal mucosa. Anus and perineum are without rashes or lesions.     Skin: No rashes, bruises or suspicious lesions. Neurologic: Grossly intact, no focal deficits, moving all  4 extremities. Psychiatric: Normal mood and affect.   Laboratory Data: Lab Results  Component Value Date   WBC 5.7 01/08/2018   HGB 14.6 01/08/2018   HCT 42.9 01/08/2018   MCV 91.5 01/08/2018   PLT 240 01/08/2018    Lab Results  Component Value Date   CREATININE 0.88 01/08/2018   Results for orders placed or performed in visit on 04/24/18  BLADDER SCAN AMB NON-IMAGING  Result Value Ref Range   Scan Result 206     UA reviewed, negative  Assessment & Plan:    1. Urgency Frequency No evidence of infection Attempt trial of Myrbetriq 25 mg. Samples given to treat overactivity Behavioral modification  2. Incomplete Bladder Emptying See above. Advised double voiding and  crede Will continue to follow  3. Pelvic Organ Prolapse Asymptomatic other than incomplete emptying which may be contributing factor Recommended follow up with Dr. Matilde Sprang in 3 weeks to discuss options including observation, surgery, vs. Pessary which were discussed briefly today  Return in about 1 week (around 05/01/2018) for nurse visit  with PVR; f/u with Dr. Matilde Sprang in 3 weeks.  [facility] [address], [place], [place] [address] [phone]  I, Stephania Fragmin , am acting as a scribe for Hollice Espy, MD  I have reviewed the above documentation for accuracy and completeness, and I agree with the above.   Hollice Espy, MD   I spent 45 min with this patient of which greater than 50% was spent in counseling and coordination of care with the patient.

## 2018-04-24 ENCOUNTER — Ambulatory Visit: Payer: PPO | Admitting: Urology

## 2018-04-24 ENCOUNTER — Encounter: Payer: Self-pay | Admitting: Urology

## 2018-04-24 VITALS — BP 116/77 | HR 69 | Ht 66.0 in | Wt 168.6 lb

## 2018-04-24 DIAGNOSIS — R350 Frequency of micturition: Secondary | ICD-10-CM

## 2018-04-24 DIAGNOSIS — R3915 Urgency of urination: Secondary | ICD-10-CM

## 2018-04-24 DIAGNOSIS — R339 Retention of urine, unspecified: Secondary | ICD-10-CM

## 2018-04-24 LAB — URINALYSIS, COMPLETE
BILIRUBIN UA: NEGATIVE
GLUCOSE, UA: NEGATIVE
KETONES UA: NEGATIVE
Nitrite, UA: NEGATIVE
PROTEIN UA: NEGATIVE
RBC, UA: NEGATIVE
Specific Gravity, UA: <1.005 — ABNORMAL LOW (ref 1.005–1.030)
UUROB: 0.2 mg/dL (ref 0.2–1.0)
pH, UA: 6 (ref 5.0–7.5)

## 2018-04-24 LAB — BLADDER SCAN AMB NON-IMAGING: Scan Result: 206

## 2018-05-01 ENCOUNTER — Ambulatory Visit (INDEPENDENT_AMBULATORY_CARE_PROVIDER_SITE_OTHER): Payer: PPO

## 2018-05-01 VITALS — BP 103/71 | HR 71 | Ht 67.0 in | Wt 163.0 lb

## 2018-05-01 DIAGNOSIS — R350 Frequency of micturition: Secondary | ICD-10-CM | POA: Diagnosis not present

## 2018-05-01 LAB — BLADDER SCAN AMB NON-IMAGING

## 2018-05-01 MED ORDER — MIRABEGRON ER 25 MG PO TB24: 25.0000 mg | ORAL_TABLET | Freq: Every day | ORAL | 11 refills | Status: DC

## 2018-05-01 NOTE — Progress Notes (Signed)
Patient present today for PVR only per Dr Erlene Quan for urinary frequency.  Patient started on Mybetriq 25mg  04/24/18 and states that medication is helping. BP today is 103/71. Myrbetriq 25mg  sent to pharmacy. Keep follow up with Dr Matilde Sprang in March as scheduled.  Bladder Scan Patient can void: 33 ml Performed By: Shawnie Dapper, CMA

## 2018-05-03 ENCOUNTER — Telehealth: Payer: Self-pay | Admitting: Urology

## 2018-05-03 NOTE — Telephone Encounter (Signed)
Patient notified that the prolapse is not a danger, She states she is not in any pain or discomfort. It just feels like it has gotten worse. She is on the waiting list and has an upcoming appointment. She will wait until her appointment.

## 2018-05-03 NOTE — Telephone Encounter (Signed)
Pt calls office stating she thinks her prolapse has gotten worse, feels like it's at the edge of her cervix, was able to find an earlier appt with Macdiarmid (never seen by him yet) in the meantime, pt asks for some clinical advise of what she should do and is there any danger. Please advise pt at [phone].

## 2018-05-21 ENCOUNTER — Ambulatory Visit (INDEPENDENT_AMBULATORY_CARE_PROVIDER_SITE_OTHER): Payer: PPO | Admitting: Urology

## 2018-05-21 ENCOUNTER — Encounter: Payer: Self-pay | Admitting: Urology

## 2018-05-21 VITALS — BP 97/61 | HR 71 | Ht 67.0 in | Wt 160.0 lb

## 2018-05-21 DIAGNOSIS — R350 Frequency of micturition: Secondary | ICD-10-CM

## 2018-05-21 MED ORDER — TOLTERODINE TARTRATE ER 4 MG PO CP24: 4.0000 mg | ORAL_CAPSULE | Freq: Every day | ORAL | 11 refills | Status: DC

## 2018-05-21 NOTE — Progress Notes (Signed)
05/21/2018 10:43 AM   Jacqueline Kaiser 04-03-1946 [id number]  Referring provider: Steele Sizer, MD [address] [place], [place] [address]  Chief Complaint  Patient presents with  . Urinary Frequency    follow up    HPI: Dr B: Patient has urgency and had failed a short course of Myrbetriq.  She was noted to have grade 2 cystocele with Valsalva and a residual of 200 mL.  She was given Myrbetriq 25 mg  Today Patient voids every 1 or 2 hours depending on fluid intake.  She gets up once or twice at night.  She is continent.  In the last 3 weeks the Myrbetriq helped her urgency some.  She feels something in the vagina.  She reduces it.  The feeling is worse when she has a bowel movement.  She has had a hysterectomy.  She has nonspecific intermittent suprapubic and low back discomfort.  She is prone to constipation  No neurologic issues.  No previous bladder surgery  Modifying factors: There are no other modifying factors  Associated signs and symptoms: There are no other associated signs and symptoms Aggravating and relieving factors: There are no other aggravating or relieving factors Severity: Moderate Duration: Persistent    PMH: Past Medical History:  Diagnosis Date  . Hyperlipidemia   . Nonsustained ventricular tachycardia ([place])    by Holter,  noraml Stress test ECHO perpatient [name], [place])  . Osteoporosis     Surgical History: Past Surgical History:  Procedure Laterality Date  . ABDOMINAL HYSTERECTOMY    . BREAST BIOPSY Right    negative 10-12 years ago- core  . CESAREAN SECTION    . TONSILLECTOMY      Home Medications:  Allergies as of 05/21/2018      Reactions   Betadine [povidone Iodine] Other (See Comments)   Burns   Iodine       Medication List       Accurate as of May 21, 2018 10:43 AM. Always use your most recent med list.        Calcium 200 MG Tabs Take 1 tablet by mouth daily.   cholecalciferol 1000 units  tablet Commonly known as:  VITAMIN D Take 1,000 Units by mouth daily.   meclizine 12.5 MG tablet Commonly known as:  ANTIVERT Take 1 tablet (12.5 mg total) by mouth 4 (four) times daily as needed for dizziness.   mirabegron ER 25 MG Tb24 tablet Commonly known as:  MYRBETRIQ Take 1 tablet (25 mg total) by mouth daily.   PROLIA 60 MG/ML Soln injection Generic drug:  denosumab 1 each by Subconjunctival route every 6 (six) months.   rosuvastatin 20 MG tablet Commonly known as:  CRESTOR Take 1 tablet (20 mg total) by mouth daily.   traZODone 50 MG tablet Commonly known as:  DESYREL Take 1 tablet (50 mg total) by mouth at bedtime as needed for sleep.   vitamin B-12 1000 MCG tablet Commonly known as:  CYANOCOBALAMIN Take 1,000 mcg by mouth daily.   vitamin C 1000 MG tablet Take 1,000 mg by mouth daily.       Allergies:  Allergies  Allergen Reactions  . Betadine [Povidone Iodine] Other (See Comments)    Burns  . Iodine     Family History: Family History  Problem Relation Age of Onset  . Heart disease Mother        ATRIAL FIB  . Cancer Father 21       Lung Ca,  nonsmoker,  metastatic at diagnosis  asbestos exposure  . Breast cancer Daughter 48       dx at age of 30 and 15    Social History:  reports that she quit smoking about 40 years ago. Her smoking use included cigarettes. She has a 5.00 pack-year smoking history. She has never used smokeless tobacco. She reports current alcohol use. She reports that she does not use drugs.  ROS: UROLOGY Frequent Urination?: Yes Hard to postpone urination?: No Burning/pain with urination?: No Get up at night to urinate?: Yes Leakage of urine?: No Urine stream starts and stops?: No Trouble starting stream?: No Do you have to strain to urinate?: No Blood in urine?: No Urinary tract infection?: No Sexually transmitted disease?: No Injury to kidneys or bladder?: No Painful intercourse?: No Weak stream?: No Currently  pregnant?: No Vaginal bleeding?: No Last menstrual period?: n  Gastrointestinal Nausea?: No Vomiting?: No Indigestion/heartburn?: No Diarrhea?: No Constipation?: Yes  Constitutional Fever: No Night sweats?: No Weight loss?: No Fatigue?: No  Skin Skin rash/lesions?: No Itching?: No  Eyes Blurred vision?: No Double vision?: No  Ears/Nose/Throat Sore throat?: No Sinus problems?: No  Hematologic/Lymphatic Swollen glands?: No Easy bruising?: No  Cardiovascular Leg swelling?: No Chest pain?: No  Respiratory Cough?: No Shortness of breath?: No  Endocrine Excessive thirst?: No  Musculoskeletal Back pain?: No Joint pain?: No  Neurological Headaches?: No Dizziness?: No  Psychologic Depression?: No Anxiety?: No  Physical Exam: BP 97/61   Pulse 71   Ht 5\' 7"  (1.702 m)   Wt 160 lb (72.6 kg)   BMI 25.06 kg/m   Constitutional:  Alert and oriented, No acute distress. HEENT: [place] AT, moist mucus membranes.  Trachea midline, no masses. Cardiovascular: No clubbing, cyanosis, or edema. Respiratory: Normal respiratory effort, no increased work of breathing. GI: Abdomen is soft, nontender, nondistended, no abdominal masses GU: On pelvic examination the patient did have a small grade 3 cystocele that reached the introitus.  She had a moderate central defect.  She did have a little bit of vaginal narrowing.  She had a little bit of vaginal shortening and the cuff descended from approximately 8 cm to approximately 5 cm.  She had mild hypermobility the bladder neck and negative cough test.  She had minimal rectocele with some atrophy changes in the vaginal epithelium.  She almost had petechiae Skin: No rashes, bruises or suspicious lesions. Lymph: No cervical or inguinal adenopathy. Neurologic: Grossly intact, no focal deficits, moving all 4 extremities. Psychiatric: Normal mood and affect.  Laboratory Data: Lab Results  Component Value Date   WBC 5.7 01/08/2018    HGB 14.6 01/08/2018   HCT 42.9 01/08/2018   MCV 91.5 01/08/2018   PLT 240 01/08/2018    Lab Results  Component Value Date   CREATININE 0.88 01/08/2018    No results found for: PSA  No results found for: TESTOSTERONE  No results found for: HGBA1C  Urinalysis    Component Value Date/Time   COLORURINE YELLOW 03/22/2016 0957   APPEARANCEUR Clear 04/24/2018 1022   LABSPEC 1.010 03/22/2016 0957   PHURINE 5.0 03/22/2016 0957   GLUCOSEU Negative 04/24/2018 1022   GLUCOSEU NEGATIVE 03/22/2016 0957   HGBUR NEGATIVE 03/22/2016 0957   BILIRUBINUR Negative 04/24/2018 1022   KETONESUR NEGATIVE 03/22/2016 0957   PROTEINUR Negative 04/24/2018 1022   UROBILINOGEN 0.2 03/22/2016 0957   NITRITE Negative 04/24/2018 1022   NITRITE NEGATIVE 03/22/2016 0957   LEUKOCYTESUR Trace (A) 04/24/2018 1022    Pertinent Imaging:  Assessment & Plan: Patient has urgency with frequency and mild nocturia.  She has prolapse symptoms.  The prolapse and leak is worsened recently and is starting to bother her.  Picture drawn.  If she ever had surgery she would likely best benefit from a transvaginal vault suspension with cystocele repair and graft.  Role of urodynamics discussed.  Role of conservative therapy and pessary and treating urgency with medications discussed  He separated treatment goals well and she would like to get the urodynamics and consider surgery.  She is not sexually active.  She will try Detrol LA 4 mg with 30 tablets and 11 refills and keep an eye out for her chronic constipation.  Urodynamics were ordered.  She understands surgery is done in [place]   There are no diagnoses linked to this encounter.  No follow-ups on file.  [name], MD  [facility] [address], [place], [place] [address] [phone]

## 2018-05-21 NOTE — Patient Instructions (Signed)
Urodynamic Testing What is urodynamic testing?  Urodynamic testing is a set of tests and X-rays. These tests help to find out how well your bladder and the part of your body that drains pee from the bladder (urethra) are working. Why do I need this testing? You may need these tests if you:  Are leaking pee (urine).  Have trouble starting or stopping peeing (urination).  Pee often or it hurts to pee.  Have urinary tract infections often.  Are not able to empty your bladder.  Have strong urges to pee.  Have a weak flow of pee. How do I prepare for the tests?  Ask your doctor about: ? Changing or stopping your normal medicines. This is important if you take diabetes medicines or blood thinners. ? Whether you should arrive for the test having to pee.  Tell your doctor about: ? Any allergies you have. ? All medicines you are taking, including vitamins, herbs, eye drops, creams, and over-the-counter medicines. ? Whether you are pregnant or may be pregnant. What are the risks? In general, these tests are safe. But some of the tests have risks. These may include:  Discomfort.  Feeling a need to pee often.  Bleeding.  Infection.  Allergic reactions to medicines or dyes. How are the tests done? The tests may be done in one visit or may be done over a few visits. You may be given an antibiotic medicine to help keep you from getting an infection. The types of tests done may include: Uroflowmetry This test measures how much you pee and how long it takes. You will pee into a type of toilet or device that sends measurements to a computer. Postvoid residual measurement This test measures how much pee is left in your bladder after you pee. It may be done by:  Using sound waves and a computer to create pictures of your bladder (ultrasound).  Putting a thin, flexible tube (catheter) into your bladder to take out the pee that is left so it can be measured. Cystometric testing This  test measures how much pressure there is in your bladder before you pee and as you pee.  You may be given a medicine to numb the area (local anesthetic).  The area around the opening of your urethra will be cleaned.  A thin, flexible tube will be used to empty your bladder.  A flexible tube that can measure pressure will then be placed. Your bladder will be filled with germ-free water.  Pressure will be measured: ? As your bladder fills. ? When you feel the need to pee. ? As your bladder is emptied.  In some cases, your bladder may be filled with a dye that shows up on X-rays (contrast material) so that X-ray pictures can be taken during the test. Electromyogram This test measures the activity of the nerves and muscles in your bladder and in the tube that empties your bladder. Sticky patches (electrodes) will be placed on your body to measure electrical activity. What happens after the testing?  You should be able to go home right away.  You can do your usual activities.  You may be told to drink a glass of water every 30 minutes. Do this for the first 2 hours you are home.  Taking a warm bath or using a warm, wet towel may relieve any discomfort. Let your doctor know if you have:  Pain.  Blood in your pee.  Chills.  Fever. What do my test results mean? Talk   with your doctor about what your results mean. These test results can help your doctor find out how well your bladder and the tube that empties your bladder are working. Your results and your symptoms can help your doctor find what might be causing your problems. Questions to ask your doctor Ask your doctor, or the department that is doing the test:  When will my results be ready?  How will I get my results?  What are my treatment options?  What other tests do I need?  What are my next steps? Summary  Urodynamic testing is a set of tests and X-rays.  These tests help to find out how well your bladder and the  part of your body that drains pee from the bladder are working.  Your results and your symptoms can help your doctor find what might be causing your problems.  Talk with your doctor about what your results mean. This information is not intended to replace advice given to you by your health care provider. Make sure you discuss any questions you have with your health care provider. Document Released: 03/03/2008 Document Revised: 01/23/2017 Document Reviewed: 01/23/2017 Elsevier Interactive Patient Education  2019 Elsevier Inc.  

## 2018-05-22 DIAGNOSIS — Z8781 Personal history of (healed) traumatic fracture: Secondary | ICD-10-CM | POA: Diagnosis not present

## 2018-05-22 DIAGNOSIS — M810 Age-related osteoporosis without current pathological fracture: Secondary | ICD-10-CM | POA: Diagnosis not present

## 2018-05-29 ENCOUNTER — Other Ambulatory Visit: Payer: Self-pay | Admitting: Urology

## 2018-05-29 DIAGNOSIS — R3914 Feeling of incomplete bladder emptying: Secondary | ICD-10-CM | POA: Diagnosis not present

## 2018-05-29 DIAGNOSIS — R350 Frequency of micturition: Secondary | ICD-10-CM | POA: Diagnosis not present

## 2018-05-31 ENCOUNTER — Other Ambulatory Visit: Payer: Self-pay | Admitting: Urology

## 2018-06-04 ENCOUNTER — Ambulatory Visit: Payer: PPO | Admitting: Urology

## 2018-06-04 ENCOUNTER — Telehealth: Payer: Self-pay | Admitting: Urology

## 2018-06-04 ENCOUNTER — Encounter: Payer: Self-pay | Admitting: Urology

## 2018-06-04 VITALS — BP 113/74 | HR 78

## 2018-06-04 DIAGNOSIS — N8111 Cystocele, midline: Secondary | ICD-10-CM

## 2018-06-04 DIAGNOSIS — R8290 Unspecified abnormal findings in urine: Secondary | ICD-10-CM | POA: Diagnosis not present

## 2018-06-04 MED ORDER — ESTROGENS, CONJUGATED 0.625 MG/GM VA CREA: 1.0000 | TOPICAL_CREAM | Freq: Every day | VAGINAL | 12 refills | Status: DC

## 2018-06-04 NOTE — Progress Notes (Signed)
06/04/2018 9:01 AM   Jacqueline Kaiser July 24, 1945 [id number]  Referring provider: Steele Sizer, MD [address] [place], [place] [address]  Chief Complaint  Patient presents with  . Urinary Frequency    UDS results    HPI: Dr B: Patient has urgency and had failed a short course of Myrbetriq.  She was noted to have grade 2 cystocele with Valsalva and a residual of 200 mL.  She was given Myrbetriq 25 mg  Today Patient voids every 1 or 2 hours depending on fluid intake.  She gets up once or twice at night.  She is continent.  In the last 3 weeks the Myrbetriq helped her urgency some.  She feels something in the vagina.  She reduces it.  The feeling is worse when she has a bowel movement.  She has had a hysterectomy.  She has nonspecific intermittent suprapubic and low back discomfort.  She is prone to constipation  GU: On pelvic examination the patient did have a small grade 3 cystocele that reached the introitus.  She had a moderate central defect.  She did have a little bit of vaginal narrowing.  She had a little bit of vaginal shortening and the cuff descended from approximately 8 cm to approximately 5 cm.  She had mild hypermobility the bladder neck and negative cough test.  She had minimal rectocele with some atrophy changes in the vaginal epithelium.  She almost had petechiae  Patient has urgency with frequency and mild nocturia.  She has prolapse symptoms.  The prolapse and leak is worsened recently and is starting to bother her.  Picture drawn.  If she ever had surgery she would likely best benefit from a transvaginal vault suspension with cystocele repair and graft.  Role of urodynamics discussed.  Role of conservative therapy and pessary and treating urgency with medications discussed  She separated treatment goals well and she would like to get the urodynamics and consider surgery.  She is not sexually active.  She will try Detrol LA 4 mg with 30 tablets and 11  refills and keep an eye out for her chronic constipation.  Urodynamics were ordered.  She understands surgery is done in [place]  Today Frequency and prolapse are stable On urodynamics she did not void and was catheterized for 400 mL.  She had not voided for approximately 45 minutes.  Maximum bladder capacity was 320 mL.  She had sensory urgency.  Bladder was unstable reaching a pressure of 13 cm of water.  She leaked a mild to moderate amount.  She did not have stress incontinence with or without the prolapse reduced at a pressure of 126 cm water.  During voluntary voiding she voided 267 mL with a maximum flow 11 mils per second.  Maximum voiding pressure was 16 cm of water.  Residual was 50 mL.  EMG activity increased during the voiding phase.  No fluoroscopy performed due to allergies.  The details of the urodynamics are signed and dictated  Urgency a little bit better on Detrol and she want to stay on it.  A picture was drawn regarding prolapse.  With my usual template I discussed a transvaginal vault suspension with cystocele repair and graft.  Conservative therapies for urgency and prolapse discussed.  The role of local estrogen cream discussed.  I think she is very clear on goals.  The prolapse is affecting her quality life.  She understands that estrogen may help some of her local symptoms   PMH:  Past Medical History:  Diagnosis Date  . Hyperlipidemia   . Nonsustained ventricular tachycardia ([place])    by Holter,  noraml Stress test ECHO perpatient [name], [place])  . Osteoporosis     Surgical History: Past Surgical History:  Procedure Laterality Date  . ABDOMINAL HYSTERECTOMY    . BREAST BIOPSY Right    negative 10-12 years ago- core  . CESAREAN SECTION    . TONSILLECTOMY      Home Medications:  Allergies as of 06/04/2018      Reactions   Betadine [povidone Iodine] Other (See Comments)   Burns   Iodine       Medication List       Accurate as of June 04, 2018  9:01 AM.  Always use your most recent med list.        Calcium 200 MG Tabs Take 1 tablet by mouth daily.   cholecalciferol 1000 units tablet Commonly known as:  VITAMIN D Take 1,000 Units by mouth daily.   meclizine 12.5 MG tablet Commonly known as:  ANTIVERT Take 1 tablet (12.5 mg total) by mouth 4 (four) times daily as needed for dizziness.   mirabegron ER 25 MG Tb24 tablet Commonly known as:  MYRBETRIQ Take 1 tablet (25 mg total) by mouth daily.   PROLIA 60 MG/ML Soln injection Generic drug:  denosumab 1 each by Subconjunctival route every 6 (six) months.   rosuvastatin 20 MG tablet Commonly known as:  CRESTOR Take 1 tablet (20 mg total) by mouth daily.   tolterodine 4 MG 24 hr capsule Commonly known as:  DETROL LA Take 1 capsule (4 mg total) by mouth daily.   traZODone 50 MG tablet Commonly known as:  DESYREL Take 1 tablet (50 mg total) by mouth at bedtime as needed for sleep.   vitamin B-12 1000 MCG tablet Commonly known as:  CYANOCOBALAMIN Take 1,000 mcg by mouth daily.   vitamin C 1000 MG tablet Take 1,000 mg by mouth daily.       Allergies:  Allergies  Allergen Reactions  . Betadine [Povidone Iodine] Other (See Comments)    Burns  . Iodine     Family History: Family History  Problem Relation Age of Onset  . Heart disease Mother        ATRIAL FIB  . Cancer Father 92       Lung Ca,  nonsmoker, metastatic at diagnosis  asbestos exposure  . Breast cancer Daughter 57       dx at age of 20 and 52    Social History:  reports that she quit smoking about 40 years ago. Her smoking use included cigarettes. She has a 5.00 pack-year smoking history. She has never used smokeless tobacco. She reports current alcohol use. She reports that she does not use drugs.  ROS: UROLOGY Frequent Urination?: Yes Hard to postpone urination?: No Burning/pain with urination?: No Get up at night to urinate?: Yes Leakage of urine?: No Urine stream starts and stops?: No Trouble  starting stream?: No Do you have to strain to urinate?: No Blood in urine?: No Urinary tract infection?: No Sexually transmitted disease?: No Injury to kidneys or bladder?: No Painful intercourse?: No Weak stream?: No Currently pregnant?: No Vaginal bleeding?: No Last menstrual period?: n  Gastrointestinal Nausea?: No Vomiting?: No Indigestion/heartburn?: No Diarrhea?: No Constipation?: Yes  Constitutional Fever: No Night sweats?: No Weight loss?: No Fatigue?: No  Skin Skin rash/lesions?: No Itching?: No  Eyes Blurred vision?: No Double vision?: No  Ears/Nose/Throat  Sore throat?: No Sinus problems?: No  Hematologic/Lymphatic Swollen glands?: No Easy bruising?: No  Cardiovascular Leg swelling?: No Chest pain?: No  Respiratory Cough?: No Shortness of breath?: No  Endocrine Excessive thirst?: No  Musculoskeletal Back pain?: No Joint pain?: No  Neurological Headaches?: No Dizziness?: No  Psychologic Depression?: No Anxiety?: No  Physical Exam: BP 113/74   Pulse 78   Constitutional:  Alert and oriented, No acute distress.  Laboratory Data: Lab Results  Component Value Date   WBC 5.7 01/08/2018   HGB 14.6 01/08/2018   HCT 42.9 01/08/2018   MCV 91.5 01/08/2018   PLT 240 01/08/2018    Lab Results  Component Value Date   CREATININE 0.88 01/08/2018    No results found for: PSA  No results found for: TESTOSTERONE  No results found for: HGBA1C  Urinalysis    Component Value Date/Time   COLORURINE YELLOW 03/22/2016 0957   APPEARANCEUR Clear 04/24/2018 1022   LABSPEC 1.010 03/22/2016 0957   PHURINE 5.0 03/22/2016 0957   GLUCOSEU Negative 04/24/2018 1022   GLUCOSEU NEGATIVE 03/22/2016 0957   HGBUR NEGATIVE 03/22/2016 0957   BILIRUBINUR Negative 04/24/2018 1022   KETONESUR NEGATIVE 03/22/2016 0957   PROTEINUR Negative 04/24/2018 1022   UROBILINOGEN 0.2 03/22/2016 0957   NITRITE Negative 04/24/2018 1022   NITRITE NEGATIVE  03/22/2016 0957   LEUKOCYTESUR Trace (A) 04/24/2018 1022    Pertinent Imaging:   Assessment & Plan: The patient would like to proceed with surgery.  She will utilize local estrogen cream 3 nights a week until the time of surgery.  We will proceed accordingly.   There are no diagnoses linked to this encounter.  No follow-ups on file.  [name], MD  [facility] [address], [place], [place] [address] [phone]

## 2018-06-04 NOTE — Telephone Encounter (Signed)
Sig on this RX says insert applicatorful is this correct or should it be changed to 2 times weekly?

## 2018-06-04 NOTE — Telephone Encounter (Signed)
Pt LMOM and needs a call back for instructions for a vaginal cream that was called in for her.

## 2018-06-05 ENCOUNTER — Other Ambulatory Visit: Payer: Self-pay | Admitting: Urology

## 2018-06-05 NOTE — Telephone Encounter (Signed)
Patient notified per Dr. Matilde Sprang an applicator full 3 times weekly at night and patient was scheduled for a post op appointment 2wks post op

## 2018-06-11 ENCOUNTER — Ambulatory Visit: Payer: PPO | Admitting: Urology

## 2018-06-12 NOTE — H&P (Signed)
Dr B:Patient has urgency and had failed a short course of Myrbetriq.She was noted to have grade 2 cystocele with Valsalva and a residual of 200 mL. She was given Myrbetriq 25 mg  Today Patient voids every 1 or 2 hours depending on fluid intake. She gets up once or twice at night. She is continent. In the last 3 weeks the Myrbetriq helped her urgency some. She feels something in the vagina. She reduces it. The feeling is worse when she has a bowel movement.  She has had a hysterectomy. She has nonspecific intermittent suprapubic and low back discomfort. She is prone to constipation  GU:On pelvic examination the patient did have a small grade 3 cystocele that reached the introitus. She had a moderate central defect. She did have a little bit of vaginal narrowing. She had a little bit of vaginal shortening and the cuff descended from approximately 8 cm to approximately 5 cm. She had mild hypermobility the bladder neck and negative cough test. She had minimal rectocele with some atrophy changes in the vaginal epithelium. She almost had petechiae  Patient has urgency with frequency and mild nocturia. She has prolapse symptoms.The prolapse and leak is worsened recently and is starting to bother her. Picture drawn. If she ever had surgery she would likely best benefit from a transvaginal vault suspension with cystocele repair and graft. Role of urodynamics discussed. Role of conservative therapy and pessary and treating urgency with medications discussed  She separated treatment goals well and she would like to get the urodynamics and consider surgery. She is not sexually active. She will try Detrol LA 4 mg with 30 tablets and 11 refills and keep an eye out for her chronic constipation. Urodynamics were ordered. She understands surgery is done in [place]  Today Frequency and prolapse are stable On urodynamics she did not void and was catheterized for 400 mL.  She had  not voided for approximately 45 minutes.  Maximum bladder capacity was 320 mL.  She had sensory urgency.  Bladder was unstable reaching a pressure of 13 cm of water.  She leaked a mild to moderate amount.  She did not have stress incontinence with or without the prolapse reduced at a pressure of 126 cm water.  During voluntary voiding she voided 267 mL with a maximum flow 11 mils per second.  Maximum voiding pressure was 16 cm of water.  Residual was 50 mL.  EMG activity increased during the voiding phase.  No fluoroscopy performed due to allergies.  The details of the urodynamics are signed and dictated  Urgency a little bit better on Detrol and she want to stay on it.  A picture was drawn regarding prolapse.  With my usual template I discussed a transvaginal vault suspension with cystocele repair and graft.  Conservative therapies for urgency and prolapse discussed.  The role of local estrogen cream discussed.  I think she is very clear on goals.  The prolapse is affecting her quality life.  She understands that estrogen may help some of her local symptoms   PMH: Past Medical History:  Diagnosis Date  . Hyperlipidemia   . Nonsustained ventricular tachycardia ([place])    by Holter,  noraml Stress test ECHO perpatient [name], [place])  . Osteoporosis     Surgical History:      Past Surgical History:  Procedure Laterality Date  . ABDOMINAL HYSTERECTOMY    . BREAST BIOPSY Right    negative 10-12 years ago- core  . CESAREAN SECTION    .  TONSILLECTOMY      Home Medications:       Allergies as of 06/04/2018      Reactions   Betadine [povidone Iodine] Other (See Comments)   Burns   Iodine          Medication List       Accurate as of June 04, 2018  9:01 AM. Always use your most recent med list.        Calcium 200 MG Tabs Take 1 tablet by mouth daily.   cholecalciferol 1000 units tablet Commonly known as:  VITAMIN D Take 1,000 Units by mouth  daily.   meclizine 12.5 MG tablet Commonly known as:  ANTIVERT Take 1 tablet (12.5 mg total) by mouth 4 (four) times daily as needed for dizziness.   mirabegron ER 25 MG Tb24 tablet Commonly known as:  MYRBETRIQ Take 1 tablet (25 mg total) by mouth daily.   PROLIA 60 MG/ML Soln injection Generic drug:  denosumab 1 each by Subconjunctival route every 6 (six) months.   rosuvastatin 20 MG tablet Commonly known as:  CRESTOR Take 1 tablet (20 mg total) by mouth daily.   tolterodine 4 MG 24 hr capsule Commonly known as:  DETROL LA Take 1 capsule (4 mg total) by mouth daily.   traZODone 50 MG tablet Commonly known as:  DESYREL Take 1 tablet (50 mg total) by mouth at bedtime as needed for sleep.   vitamin B-12 1000 MCG tablet Commonly known as:  CYANOCOBALAMIN Take 1,000 mcg by mouth daily.   vitamin C 1000 MG tablet Take 1,000 mg by mouth daily.       Allergies:       Allergies  Allergen Reactions  . Betadine [Povidone Iodine] Other (See Comments)    Burns  . Iodine     Family History:      Family History  Problem Relation Age of Onset  . Heart disease Mother        ATRIAL FIB  . Cancer Father 54       Lung Ca,  nonsmoker, metastatic at diagnosis  asbestos exposure  . Breast cancer Daughter 71       dx at age of 52 and 47    Social History:  reports that she quit smoking about 40 years ago. Her smoking use included cigarettes. She has a 5.00 pack-year smoking history. She has never used smokeless tobacco. She reports current alcohol use. She reports that she does not use drugs.  ROS: UROLOGY Frequent Urination?: Yes Hard to postpone urination?: No Burning/pain with urination?: No Get up at night to urinate?: Yes Leakage of urine?: No Urine stream starts and stops?: No Trouble starting stream?: No Do you have to strain to urinate?: No Blood in urine?: No Urinary tract infection?: No Sexually transmitted disease?: No Injury to  kidneys or bladder?: No Painful intercourse?: No Weak stream?: No Currently pregnant?: No Vaginal bleeding?: No Last menstrual period?: n  Gastrointestinal Nausea?: No Vomiting?: No Indigestion/heartburn?: No Diarrhea?: No Constipation?: Yes  Constitutional Fever: No Night sweats?: No Weight loss?: No Fatigue?: No  Skin Skin rash/lesions?: No Itching?: No  Eyes Blurred vision?: No Double vision?: No  Ears/Nose/Throat Sore throat?: No Sinus problems?: No  Hematologic/Lymphatic Swollen glands?: No Easy bruising?: No  Cardiovascular Leg swelling?: No Chest pain?: No  Respiratory Cough?: No Shortness of breath?: No  Endocrine Excessive thirst?: No  Musculoskeletal Back pain?: No Joint pain?: No  Neurological Headaches?: No Dizziness?: No  Psychologic Depression?: No Anxiety?:  No  Physical Exam: BP 113/74   Pulse 78   Constitutional:  Alert and oriented, No acute distress.  Laboratory Data:      Lab Results  Component Value Date   WBC 5.7 01/08/2018   HGB 14.6 01/08/2018   HCT 42.9 01/08/2018   MCV 91.5 01/08/2018   PLT 240 01/08/2018         Lab Results  Component Value Date   CREATININE 0.88 01/08/2018    No results found for: PSA  No results found for: TESTOSTERONE  No results found for: HGBA1C  Urinalysis         Component Value Date/Time   COLORURINE YELLOW 03/22/2016 0957   APPEARANCEUR Clear 04/24/2018 1022   LABSPEC 1.010 03/22/2016 0957   PHURINE 5.0 03/22/2016 0957   GLUCOSEU Negative 04/24/2018 1022   GLUCOSEU NEGATIVE 03/22/2016 0957   HGBUR NEGATIVE 03/22/2016 0957   BILIRUBINUR Negative 04/24/2018 1022   KETONESUR NEGATIVE 03/22/2016 0957   PROTEINUR Negative 04/24/2018 1022   UROBILINOGEN 0.2 03/22/2016 0957   NITRITE Negative 04/24/2018 1022   NITRITE NEGATIVE 03/22/2016 0957   LEUKOCYTESUR Trace (A) 04/24/2018 1022    Pertinent Imaging:   Assessment &  Plan: The patient would like to proceed with surgery.  She will utilize local estrogen cream 3 nights a week until the time of surgery.  We will proceed accordingly.   There are no diagnoses linked to this encounter.  No follow-ups on file.  After a thorough review of the management options for the patient's condition the patient  elected to proceed with surgical therapy as noted above. We have discussed the potential benefits and risks of the procedure, side effects of the proposed treatment, the likelihood of the patient achieving the goals of the procedure, and any potential problems that might occur during the procedure or recuperation. Informed consent has been obtained.  [name], MD

## 2018-06-14 ENCOUNTER — Encounter (HOSPITAL_COMMUNITY): Payer: Self-pay

## 2018-06-14 NOTE — Patient Instructions (Signed)
Your procedure is scheduled on: Tuesday, June 19, 2018   Surgery Time:  7:30AM-10:06AM   Report to [place]  Entrance   Report to Short Stay at 5:30 AM   Call this number if you have problems the morning of surgery [phone]   Do not eat food or drink liquids :After Midnight.   Brush your teeth the morning of surgery.   Do NOT smoke after Midnight   Take these medicines the morning of surgery with A SIP OF WATER: Zyrtec if needed                               You may not have any metal on your body including hair pins, jewelry, and body piercings             Do not wear make-up, lotions, powders, perfumes/cologne, or deodorant             Do not wear nail polish.  Do not shave  48 hours prior to surgery.               Do not bring valuables to the hospital. [place].   Contacts, dentures or bridgework may not be worn into surgery.   Leave suitcase in the car. After surgery it may be brought to your room.    Special Instructions: Bring a copy of your healthcare power of attorney and living will documents         the day of surgery if you haven't scanned them in before.              Please read over the following fact sheets you were given:  [facility] - Preparing for Surgery Before surgery, you can play an important role.  Because skin is not sterile, your skin needs to be as free of germs as possible.  You can reduce the number of germs on your skin by washing with CHG (chlorahexidine gluconate) soap before surgery.  CHG is an antiseptic cleaner which kills germs and bonds with the skin to continue killing germs even after washing. Please DO NOT use if you have an allergy to CHG or antibacterial soaps.  If your skin becomes reddened/irritated stop using the CHG and inform your nurse when you arrive at Short Stay. Do not shave (including legs and underarms) for at least 48 hours prior to the first CHG  shower.  You may shave your face/neck.  Please follow these instructions carefully:  1.  Shower with CHG Soap the night before surgery and the  morning of surgery.  2.  If you choose to wash your hair, wash your hair first as usual with your normal  shampoo.  3.  After you shampoo, rinse your hair and body thoroughly to remove the shampoo.                             4.  Use CHG as you would any other liquid soap.  You can apply chg directly to the skin and wash.  Gently with a scrungie or clean washcloth.  5.  Apply the CHG Soap to your body ONLY FROM THE NECK DOWN.   Do   not use on face/ open  Wound or open sores. Avoid contact with eyes, ears mouth and   genitals (private parts).                       Wash face,  Genitals (private parts) with your normal soap.             6.  Wash thoroughly, paying special attention to the area where your    surgery  will be performed.  7.  Thoroughly rinse your body with warm water from the neck down.  8.  DO NOT shower/wash with your normal soap after using and rinsing off the CHG Soap.                9.  Pat yourself dry with a clean towel.            10.  Wear clean pajamas.            11.  Place clean sheets on your bed the night of your first shower and do not  sleep with pets. Day of Surgery : Do not apply any lotions/deodorants the morning of surgery.  Please wear clean clothes to the hospital/surgery center.  FAILURE TO FOLLOW THESE INSTRUCTIONS MAY RESULT IN THE CANCELLATION OF YOUR SURGERY  PATIENT SIGNATURE_________________________________  NURSE SIGNATURE__________________________________  ________________________________________________________________________  WHAT IS A BLOOD TRANSFUSION? Blood Transfusion Information  A transfusion is the replacement of blood or some of its parts. Blood is made up of multiple cells which provide different functions.  Red blood cells carry oxygen and are used for blood loss  replacement.  White blood cells fight against infection.  Platelets control bleeding.  Plasma helps clot blood.  Other blood products are available for specialized needs, such as hemophilia or other clotting disorders. BEFORE THE TRANSFUSION  Who gives blood for transfusions?   Healthy volunteers who are fully evaluated to make sure their blood is safe. This is blood bank blood. Transfusion therapy is the safest it has ever been in the practice of medicine. Before blood is taken from a donor, a complete history is taken to make sure that person has no history of diseases nor engages in risky social behavior (examples are intravenous drug use or sexual activity with multiple partners). The donor's travel history is screened to minimize risk of transmitting infections, such as malaria. The donated blood is tested for signs of infectious diseases, such as HIV and hepatitis. The blood is then tested to be sure it is compatible with you in order to minimize the chance of a transfusion reaction. If you or a relative donates blood, this is often done in anticipation of surgery and is not appropriate for emergency situations. It takes many days to process the donated blood. RISKS AND COMPLICATIONS Although transfusion therapy is very safe and saves many lives, the main dangers of transfusion include:   Getting an infectious disease.  Developing a transfusion reaction. This is an allergic reaction to something in the blood you were given. Every precaution is taken to prevent this. The decision to have a blood transfusion has been considered carefully by your caregiver before blood is given. Blood is not given unless the benefits outweigh the risks. AFTER THE TRANSFUSION  Right after receiving a blood transfusion, you will usually feel much better and more energetic. This is especially true if your red blood cells have gotten low (anemic). The transfusion raises the level of the red blood cells which  carry oxygen, and this usually  causes an energy increase.  The nurse administering the transfusion will monitor you carefully for complications. HOME CARE INSTRUCTIONS  No special instructions are needed after a transfusion. You may find your energy is better. Speak with your caregiver about any limitations on activity for underlying diseases you may have. SEEK MEDICAL CARE IF:   Your condition is not improving after your transfusion.  You develop redness or irritation at the intravenous (IV) site. SEEK IMMEDIATE MEDICAL CARE IF:  Any of the following symptoms occur over the next 12 hours:  Shaking chills.  You have a temperature by mouth above 102 F (38.9 C), not controlled by medicine.  Chest, back, or muscle pain.  People around you feel you are not acting correctly or are confused.  Shortness of breath or difficulty breathing.  Dizziness and fainting.  You get a rash or develop hives.  You have a decrease in urine output.  Your urine turns a dark color or changes to pink, red, or brown. Any of the following symptoms occur over the next 10 days:  You have a temperature by mouth above 102 F (38.9 C), not controlled by medicine.  Shortness of breath.  Weakness after normal activity.  The white part of the eye turns yellow (jaundice).  You have a decrease in the amount of urine or are urinating less often.  Your urine turns a dark color or changes to pink, red, or brown. Document Released: 03/18/2000 Document Revised: 06/13/2011 Document Reviewed: 11/05/2007 [facility] Patient Information 2014 [place], [place].  _______________________________________________________________________

## 2018-06-15 ENCOUNTER — Encounter (HOSPITAL_COMMUNITY)
Admission: RE | Admit: 2018-06-15 | Discharge: 2018-06-15 | Disposition: A | Discharge status: Home / Self Care | Payer: PPO | Source: Ambulatory Visit | Attending: Urology | Admitting: Urology

## 2018-06-15 ENCOUNTER — Encounter (HOSPITAL_COMMUNITY): Payer: Self-pay

## 2018-06-15 ENCOUNTER — Other Ambulatory Visit: Payer: Self-pay

## 2018-06-15 DIAGNOSIS — Z01812 Encounter for preprocedural laboratory examination: Secondary | ICD-10-CM | POA: Diagnosis not present

## 2018-06-15 HISTORY — DX: Personal history of other specified conditions: Z87.898

## 2018-06-15 HISTORY — DX: Insomnia, unspecified: G47.00

## 2018-06-15 HISTORY — DX: Fracture of unspecified carpal bone, unspecified wrist, initial encounter for closed fracture: S62.109A

## 2018-06-15 HISTORY — DX: Gastro-esophageal reflux disease without esophagitis: K21.9

## 2018-06-15 HISTORY — DX: Cystocele, unspecified: N81.10

## 2018-06-15 HISTORY — DX: Frequency of micturition: R35.0

## 2018-06-15 HISTORY — DX: Chronic sinusitis, unspecified: J32.9

## 2018-06-15 HISTORY — DX: Anxiety disorder, unspecified: F41.9

## 2018-06-15 HISTORY — DX: Obesity, unspecified: E66.9

## 2018-06-15 HISTORY — DX: Other specified diseases of liver: K76.89

## 2018-06-15 HISTORY — DX: Other injury of unspecified body region, initial encounter: T14.8XXA

## 2018-06-15 HISTORY — DX: Sprain of ligaments of lumbar spine, initial encounter: S33.5XXA

## 2018-06-15 HISTORY — DX: Other fracture of unspecified lower leg, initial encounter for closed fracture: S82.899A

## 2018-06-15 LAB — BASIC METABOLIC PANEL
Anion gap: 8 (ref 5–15)
BUN: 22 mg/dL (ref 8–23)
CHLORIDE: 105 mmol/L (ref 98–111)
CO2: 28 mmol/L (ref 22–32)
Calcium: 9.4 mg/dL (ref 8.9–10.3)
Creatinine, Ser: 0.94 mg/dL (ref 0.44–1.00)
GFR calc Af Amer: >60 mL/min (ref >60)
GFR calc non Af Amer: 60 mL/min — ABNORMAL LOW (ref >60)
Glucose, Bld: 83 mg/dL (ref 70–99)
Potassium: 4.3 mmol/L (ref 3.5–5.1)
Sodium: 141 mmol/L (ref 135–145)

## 2018-06-15 LAB — CBC
HCT: 43.8 % (ref 36.0–46.0)
Hemoglobin: 13.7 g/dL (ref 12.0–15.0)
MCH: 30.5 pg (ref 26.0–34.0)
MCHC: 31.3 g/dL (ref 30.0–36.0)
MCV: 97.6 fL (ref 80.0–100.0)
Platelets: 198 10*3/uL (ref 150–400)
RBC: 4.49 MIL/uL (ref 3.87–5.11)
RDW: 12 % (ref 11.5–15.5)
WBC: 4.5 10*3/uL (ref 4.0–10.5)
nRBC: 0 % (ref 0.0–0.2)

## 2018-06-15 LAB — PROTIME-INR
INR: 0.8 (ref 0.8–1.2)
Prothrombin Time: 11.3 seconds — ABNORMAL LOW (ref 11.4–15.2)

## 2018-06-16 LAB — ABO/RH: ABO/RH(D): O POS

## 2018-06-18 ENCOUNTER — Ambulatory Visit: Payer: PPO | Admitting: Urology

## 2018-06-18 MED ORDER — GENTAMICIN SULFATE 40 MG/ML IJ SOLN
340.0000 mg | INTRAVENOUS | Status: AC
  Administered 2018-06-19: 340 mg via INTRAVENOUS
  Filled 2018-06-18: qty 8.5

## 2018-06-18 NOTE — Anesthesia Preprocedure Evaluation (Addendum)
Anesthesia Evaluation  Patient identified by MRN, date of birth, ID band Patient awake    Reviewed: Allergy & Precautions, NPO status , Patient's Chart, lab work & pertinent test results  Airway Mallampati: I  TM Distance: >3 FB Neck ROM: Full    Dental no notable dental hx. (+) Teeth Intact, Dental Advisory Given   Pulmonary neg pulmonary ROS, former smoker,    Pulmonary exam normal breath sounds clear to auscultation       Cardiovascular negative cardio ROS Normal cardiovascular exam Rhythm:Regular Rate:Normal  Normal stress test and TTE per pt   Neuro/Psych PSYCHIATRIC DISORDERS Anxiety negative neurological ROS     GI/Hepatic Neg liver ROS, GERD  Controlled,  Endo/Other  negative endocrine ROS  Renal/GU negative Renal ROS  negative genitourinary   Musculoskeletal negative musculoskeletal ROS (+)   Abdominal   Peds  Hematology negative hematology ROS (+)   Anesthesia Other Findings   Reproductive/Obstetrics                            Anesthesia Physical Anesthesia Plan  ASA: II  Anesthesia Plan: General   Post-op Pain Management:    Induction: Intravenous  PONV Risk Score and Plan: 3 and Ondansetron, Dexamethasone and Treatment may vary due to age or medical condition  Airway Management Planned: Oral ETT and LMA  Additional Equipment:   Intra-op Plan:   Post-operative Plan: Extubation in OR  Informed Consent: I have reviewed the patients History and Physical, chart, labs and discussed the procedure including the risks, benefits and alternatives for the proposed anesthesia with the patient or authorized representative who has indicated his/her understanding and acceptance.     Dental advisory given  Plan Discussed with: CRNA  Anesthesia Plan Comments:         Anesthesia Quick Evaluation

## 2018-06-19 ENCOUNTER — Ambulatory Visit (HOSPITAL_COMMUNITY): Payer: PPO | Admitting: Anesthesiology

## 2018-06-19 ENCOUNTER — Observation Stay (HOSPITAL_COMMUNITY)
Admission: RE | Admit: 2018-06-19 | Discharge: 2018-06-20 | Disposition: A | Discharge status: Home / Self Care | Payer: PPO | Source: Ambulatory Visit | Attending: Urology | Admitting: Urology

## 2018-06-19 ENCOUNTER — Encounter (HOSPITAL_COMMUNITY): Payer: Self-pay

## 2018-06-19 ENCOUNTER — Ambulatory Visit (HOSPITAL_COMMUNITY): Payer: PPO | Admitting: Physician Assistant

## 2018-06-19 ENCOUNTER — Encounter (HOSPITAL_COMMUNITY)
Admission: RE | Disposition: A | Discharge status: Home / Self Care | Payer: Self-pay | Source: Ambulatory Visit | Attending: Urology

## 2018-06-19 ENCOUNTER — Other Ambulatory Visit: Payer: Self-pay

## 2018-06-19 DIAGNOSIS — Z79899 Other long term (current) drug therapy: Secondary | ICD-10-CM | POA: Insufficient documentation

## 2018-06-19 DIAGNOSIS — Z90710 Acquired absence of both cervix and uterus: Secondary | ICD-10-CM | POA: Insufficient documentation

## 2018-06-19 DIAGNOSIS — Z87891 Personal history of nicotine dependence: Secondary | ICD-10-CM | POA: Insufficient documentation

## 2018-06-19 DIAGNOSIS — E785 Hyperlipidemia, unspecified: Secondary | ICD-10-CM | POA: Insufficient documentation

## 2018-06-19 DIAGNOSIS — N814 Uterovaginal prolapse, unspecified: Secondary | ICD-10-CM | POA: Diagnosis present

## 2018-06-19 DIAGNOSIS — N8110 Cystocele, unspecified: Secondary | ICD-10-CM | POA: Diagnosis not present

## 2018-06-19 DIAGNOSIS — N8111 Cystocele, midline: Secondary | ICD-10-CM | POA: Diagnosis not present

## 2018-06-19 DIAGNOSIS — N810 Urethrocele: Secondary | ICD-10-CM | POA: Diagnosis not present

## 2018-06-19 HISTORY — PX: CYSTOCELE REPAIR: SHX163

## 2018-06-19 LAB — TYPE AND SCREEN
ABO/RH(D): O POS
Antibody Screen: NEGATIVE

## 2018-06-19 LAB — BASIC METABOLIC PANEL
Anion gap: 9 (ref 5–15)
BUN: 14 mg/dL (ref 8–23)
CO2: 25 mmol/L (ref 22–32)
Calcium: 8.2 mg/dL — ABNORMAL LOW (ref 8.9–10.3)
Chloride: 104 mmol/L (ref 98–111)
Creatinine, Ser: 0.87 mg/dL (ref 0.44–1.00)
GFR calc Af Amer: >60 mL/min (ref >60)
GFR calc non Af Amer: >60 mL/min (ref >60)
GLUCOSE: 196 mg/dL — AB (ref 70–99)
Potassium: 3.9 mmol/L (ref 3.5–5.1)
Sodium: 138 mmol/L (ref 135–145)

## 2018-06-19 SURGERY — COLPORRHAPHY, ANTERIOR, FOR CYSTOCELE REPAIR
Anesthesia: General

## 2018-06-19 MED ORDER — ONDANSETRON HCL 4 MG/2ML IJ SOLN: 4.0000 mg | INTRAMUSCULAR | Status: DC | PRN

## 2018-06-19 MED ORDER — ACETAMINOPHEN 325 MG PO TABS
650.0000 mg | ORAL_TABLET | ORAL | Status: DC | PRN
  Administered 2018-06-19 (×2): 650 mg via ORAL
  Filled 2018-06-19 (×2): qty 2

## 2018-06-19 MED ORDER — MECLIZINE HCL 25 MG PO TABS: 12.5000 mg | ORAL_TABLET | Freq: Four times a day (QID) | ORAL | Status: DC | PRN

## 2018-06-19 MED ORDER — PROPOFOL 10 MG/ML IV BOLUS
INTRAVENOUS | Status: DC | PRN
  Administered 2018-06-19 (×2): 30 mg via INTRAVENOUS
  Administered 2018-06-19: 100 mg via INTRAVENOUS
  Administered 2018-06-19: 40 mg via INTRAVENOUS

## 2018-06-19 MED ORDER — SODIUM CHLORIDE 0.9 % IV SOLN
INTRAVENOUS | Status: AC
  Filled 2018-06-19: qty 500000

## 2018-06-19 MED ORDER — STERILE WATER FOR IRRIGATION IR SOLN
Status: DC | PRN
  Administered 2018-06-19: 3000 mL

## 2018-06-19 MED ORDER — BELLADONNA ALKALOIDS-OPIUM 16.2-60 MG RE SUPP: 1.0000 | Freq: Four times a day (QID) | RECTAL | Status: DC | PRN

## 2018-06-19 MED ORDER — MIDAZOLAM HCL 2 MG/2ML IJ SOLN
INTRAMUSCULAR | Status: AC
  Filled 2018-06-19: qty 2

## 2018-06-19 MED ORDER — HYDROCODONE-ACETAMINOPHEN 5-325 MG PO TABS
1.0000 | ORAL_TABLET | ORAL | Status: DC | PRN
  Administered 2018-06-19 – 2018-06-20 (×2): 1 via ORAL
  Filled 2018-06-19 (×2): qty 1

## 2018-06-19 MED ORDER — SODIUM CHLORIDE 0.9 % IV SOLN
INTRAVENOUS | Status: DC | PRN
  Administered 2018-06-19: 500 mL

## 2018-06-19 MED ORDER — ROCURONIUM BROMIDE 10 MG/ML (PF) SYRINGE
PREFILLED_SYRINGE | INTRAVENOUS | Status: DC | PRN
  Administered 2018-06-19: 10 mg via INTRAVENOUS
  Administered 2018-06-19: 5 mg via INTRAVENOUS
  Administered 2018-06-19: 50 mg via INTRAVENOUS
  Administered 2018-06-19: 5 mg via INTRAVENOUS
  Administered 2018-06-19: 10 mg via INTRAVENOUS

## 2018-06-19 MED ORDER — LACTATED RINGERS IV SOLN
INTRAVENOUS | Status: DC
  Administered 2018-06-19 (×2): via INTRAVENOUS

## 2018-06-19 MED ORDER — SUGAMMADEX SODIUM 200 MG/2ML IV SOLN
INTRAVENOUS | Status: DC | PRN
  Administered 2018-06-19: 200 mg via INTRAVENOUS

## 2018-06-19 MED ORDER — ONDANSETRON HCL 4 MG/2ML IJ SOLN
INTRAMUSCULAR | Status: AC
  Filled 2018-06-19: qty 2

## 2018-06-19 MED ORDER — EPHEDRINE SULFATE-NACL 50-0.9 MG/10ML-% IV SOSY
PREFILLED_SYRINGE | INTRAVENOUS | Status: DC | PRN
  Administered 2018-06-19 (×3): 10 mg via INTRAVENOUS

## 2018-06-19 MED ORDER — FENTANYL CITRATE (PF) 250 MCG/5ML IJ SOLN
INTRAMUSCULAR | Status: AC
  Filled 2018-06-19: qty 5

## 2018-06-19 MED ORDER — FENTANYL CITRATE (PF) 100 MCG/2ML IJ SOLN: 25.0000 ug | INTRAMUSCULAR | Status: DC | PRN

## 2018-06-19 MED ORDER — DIPHENHYDRAMINE HCL 50 MG/ML IJ SOLN: 12.5000 mg | Freq: Four times a day (QID) | INTRAMUSCULAR | Status: DC | PRN

## 2018-06-19 MED ORDER — HYDROCODONE-ACETAMINOPHEN 5-325 MG PO TABS: 1.0000 | ORAL_TABLET | Freq: Four times a day (QID) | ORAL | 0 refills | Status: DC | PRN

## 2018-06-19 MED ORDER — DEXAMETHASONE SODIUM PHOSPHATE 10 MG/ML IJ SOLN
INTRAMUSCULAR | Status: AC
  Filled 2018-06-19: qty 1

## 2018-06-19 MED ORDER — ONDANSETRON HCL 4 MG/2ML IJ SOLN
INTRAMUSCULAR | Status: DC | PRN
  Administered 2018-06-19: 4 mg via INTRAVENOUS

## 2018-06-19 MED ORDER — MIDAZOLAM HCL 2 MG/2ML IJ SOLN
INTRAMUSCULAR | Status: DC | PRN
  Administered 2018-06-19: 2 mg via INTRAVENOUS

## 2018-06-19 MED ORDER — PHENAZOPYRIDINE HCL 200 MG PO TABS
200.0000 mg | ORAL_TABLET | Freq: Once | ORAL | Status: AC
  Administered 2018-06-19: 200 mg via ORAL
  Filled 2018-06-19: qty 1

## 2018-06-19 MED ORDER — LIDOCAINE 2% (20 MG/ML) 5 ML SYRINGE
INTRAMUSCULAR | Status: DC | PRN
  Administered 2018-06-19: 100 mg via INTRAVENOUS

## 2018-06-19 MED ORDER — ROSUVASTATIN CALCIUM 20 MG PO TABS
20.0000 mg | ORAL_TABLET | Freq: Every evening | ORAL | Status: DC
  Administered 2018-06-19: 20 mg via ORAL
  Filled 2018-06-19: qty 1

## 2018-06-19 MED ORDER — ACETAMINOPHEN 500 MG PO TABS
1000.0000 mg | ORAL_TABLET | Freq: Once | ORAL | Status: AC
  Administered 2018-06-19: 1000 mg via ORAL
  Filled 2018-06-19: qty 2

## 2018-06-19 MED ORDER — DIPHENHYDRAMINE HCL 12.5 MG/5ML PO ELIX: 12.5000 mg | ORAL_SOLUTION | Freq: Four times a day (QID) | ORAL | Status: DC | PRN

## 2018-06-19 MED ORDER — DEXAMETHASONE SODIUM PHOSPHATE 10 MG/ML IJ SOLN
INTRAMUSCULAR | Status: DC | PRN
  Administered 2018-06-19: 10 mg via INTRAVENOUS

## 2018-06-19 MED ORDER — CLINDAMYCIN PHOSPHATE 2 % VA CREA
TOPICAL_CREAM | VAGINAL | Status: DC | PRN
  Administered 2018-06-19: 1 via VAGINAL

## 2018-06-19 MED ORDER — ROCURONIUM BROMIDE 10 MG/ML (PF) SYRINGE
PREFILLED_SYRINGE | INTRAVENOUS | Status: AC
  Filled 2018-06-19: qty 10

## 2018-06-19 MED ORDER — LIDOCAINE-EPINEPHRINE (PF) 1 %-1:200000 IJ SOLN
INTRAMUSCULAR | Status: DC | PRN
  Administered 2018-06-19: 20 mL

## 2018-06-19 MED ORDER — EPHEDRINE 5 MG/ML INJ
INTRAVENOUS | Status: AC
  Filled 2018-06-19: qty 10

## 2018-06-19 MED ORDER — PROPOFOL 10 MG/ML IV BOLUS
INTRAVENOUS | Status: AC
  Filled 2018-06-19: qty 20

## 2018-06-19 MED ORDER — FENTANYL CITRATE (PF) 250 MCG/5ML IJ SOLN
INTRAMUSCULAR | Status: DC | PRN
  Administered 2018-06-19: 100 ug via INTRAVENOUS
  Administered 2018-06-19 (×3): 50 ug via INTRAVENOUS

## 2018-06-19 MED ORDER — CLINDAMYCIN PHOSPHATE 900 MG/50ML IV SOLN
900.0000 mg | INTRAVENOUS | Status: AC
  Administered 2018-06-19: 900 mg via INTRAVENOUS
  Filled 2018-06-19: qty 50

## 2018-06-19 MED ORDER — FLUORESCEIN SODIUM 10 % IV SOLN
INTRAVENOUS | Status: AC
  Filled 2018-06-19: qty 5

## 2018-06-19 MED ORDER — MORPHINE SULFATE (PF) 2 MG/ML IV SOLN: 2.0000 mg | INTRAVENOUS | Status: DC | PRN

## 2018-06-19 MED ORDER — CLINDAMYCIN PHOSPHATE 2 % VA CREA
TOPICAL_CREAM | VAGINAL | Status: AC
  Filled 2018-06-19: qty 40

## 2018-06-19 MED ORDER — LIDOCAINE-EPINEPHRINE (PF) 1 %-1:200000 IJ SOLN
INTRAMUSCULAR | Status: AC
  Filled 2018-06-19: qty 30

## 2018-06-19 MED ORDER — SUGAMMADEX SODIUM 200 MG/2ML IV SOLN
INTRAVENOUS | Status: AC
  Filled 2018-06-19: qty 2

## 2018-06-19 MED ORDER — DEXTROSE-NACL 5-0.45 % IV SOLN
INTRAVENOUS | Status: DC
  Administered 2018-06-19 – 2018-06-20 (×2): via INTRAVENOUS

## 2018-06-19 MED ORDER — LIDOCAINE 2% (20 MG/ML) 5 ML SYRINGE
INTRAMUSCULAR | Status: AC
  Filled 2018-06-19: qty 5

## 2018-06-19 SURGICAL SUPPLY — 52 items
BAG DECANTER FOR FLEXI CONT (MISCELLANEOUS) ×2 IMPLANT
BAG URINE DRAINAGE (UROLOGICAL SUPPLIES) ×2 IMPLANT
BLADE CLIPPER SURG (BLADE) ×2 IMPLANT
BLADE SURG 15 STRL LF DISP TIS (BLADE) ×1 IMPLANT
BLADE SURG 15 STRL SS (BLADE) ×1
CATH FOLEY 2WAY SLVR  5CC 14FR (CATHETERS) ×1
CATH FOLEY 2WAY SLVR 5CC 14FR (CATHETERS) ×1 IMPLANT
CHLORAPREP W/TINT 26 (MISCELLANEOUS) ×2 IMPLANT
COVER MAYO STAND STRL (DRAPES) ×2 IMPLANT
COVER WAND RF STERILE (DRAPES) IMPLANT
DECANTER SPIKE VIAL GLASS SM (MISCELLANEOUS) ×2 IMPLANT
DEVICE CAPIO SLIM SINGLE (INSTRUMENTS) IMPLANT
DRAIN PENROSE 18X1/4 LTX STRL (WOUND CARE) ×2 IMPLANT
DRAPE SHEET LG 3/4 BI-LAMINATE (DRAPES) ×2 IMPLANT
DRAPE UNDERBUTTOCKS STRL (DRAPE) ×2 IMPLANT
ELECT PENCIL ROCKER SW 15FT (MISCELLANEOUS) ×2 IMPLANT
GAUZE 4X4 16PLY RFD (DISPOSABLE) ×6 IMPLANT
GAUZE PACKING 1 X5 YD ST (GAUZE/BANDAGES/DRESSINGS) ×2 IMPLANT
GAUZE PACKING 2X5 YD STRL (GAUZE/BANDAGES/DRESSINGS) ×2 IMPLANT
GLOVE BIO SURGEON STRL SZ 6.5 (GLOVE) ×2 IMPLANT
GLOVE BIOGEL M STRL SZ7.5 (GLOVE) ×2 IMPLANT
GLOVE ECLIPSE 8.5 STRL (GLOVE) ×2 IMPLANT
GOWN STRL REUS W/TWL XL LVL3 (GOWN DISPOSABLE) ×2 IMPLANT
HOLDER FOLEY CATH W/STRAP (MISCELLANEOUS) ×2 IMPLANT
IV NS 1000ML (IV SOLUTION)
IV NS 1000ML BAXH (IV SOLUTION) IMPLANT
KIT BASIN OR (CUSTOM PROCEDURE TRAY) ×2 IMPLANT
KIT TURNOVER KIT A (KITS) IMPLANT
NEEDLE HYPO 22GX1.5 SAFETY (NEEDLE) ×2 IMPLANT
NEEDLE MAYO 6 CRC TAPER PT (NEEDLE) ×2 IMPLANT
NS IRRIG 1000ML POUR BTL (IV SOLUTION) ×2 IMPLANT
PACK CYSTO (CUSTOM PROCEDURE TRAY) ×2 IMPLANT
PLUG CATH AND CAP STER (CATHETERS) ×2 IMPLANT
RETRACTOR STAY HOOK 5MM (MISCELLANEOUS) ×2 IMPLANT
SCRUB CHG 4% DYNA-HEX 4OZ (MISCELLANEOUS) ×2 IMPLANT
SHEET LAVH (DRAPES) ×2 IMPLANT
SUT CAPIO ETHIBPND (SUTURE) IMPLANT
SUT VIC AB 0 CT1 27 (SUTURE) ×1
SUT VIC AB 0 CT1 27XBRD ANTBC (SUTURE) ×1 IMPLANT
SUT VIC AB 2-0 CT1 27 (SUTURE) ×2
SUT VIC AB 2-0 CT1 27XBRD (SUTURE) ×2 IMPLANT
SUT VIC AB 2-0 SH 27 (SUTURE) ×4
SUT VIC AB 2-0 SH 27X BRD (SUTURE) ×4 IMPLANT
SUT VIC AB 3-0 SH 27 (SUTURE) ×2
SUT VIC AB 3-0 SH 27XBRD (SUTURE) ×2 IMPLANT
SUT VICRYL 0 UR6 27IN ABS (SUTURE) ×4 IMPLANT
SYR 10ML LL (SYRINGE) ×2 IMPLANT
TOWEL OR 17X26 10 PK STRL BLUE (TOWEL DISPOSABLE) ×2 IMPLANT
TOWEL OR NON WOVEN STRL DISP B (DISPOSABLE) ×2 IMPLANT
TUBING CONNECTING 10 (TUBING) ×2 IMPLANT
WATER STERILE IRR 1000ML POUR (IV SOLUTION) ×2 IMPLANT
YANKAUER SUCT BULB TIP 10FT TU (MISCELLANEOUS) ×2 IMPLANT

## 2018-06-19 NOTE — Interval H&P Note (Signed)
History and Physical Interval Note:  06/19/2018 7:26 AM  Jacqueline Kaiser  has presented today for surgery, with the diagnosis of CYSTOCELE VAULT PROLAPSE.  The various methods of treatment have been discussed with the patient and family. After consideration of risks, benefits and other options for treatment, the patient has consented to  Procedure(s): CYSTOSCOPY ANTERIOR REPAIR (CYSTOCELE) (N/A) VAGINAL VAULT SUSPENSION GRAFT (N/A) as a surgical intervention.  The patient's history has been reviewed, patient examined, no change in status, stable for surgery.  I have reviewed the patient's chart and labs.  Questions were answered to the patient's satisfaction.     Etsuko Dierolf A Issam Carlyon

## 2018-06-19 NOTE — Transfer of Care (Signed)
Immediate Anesthesia Transfer of Care Note  Patient: Jacqueline Kaiser  Procedure(s) Performed: CYSTOSCOPY ANTERIOR REPAIR (CYSTOCELE) (N/A )  Patient Location: PACU  Anesthesia Type:General  Level of Consciousness: awake  Airway & Oxygen Therapy: Patient Spontanous Breathing and Patient connected to face mask oxygen  Post-op Assessment: Report given to RN and Post -op Vital signs reviewed and stable  Post vital signs: Reviewed and stable  Last Vitals:  Vitals Value Taken Time  BP 112/63 06/19/2018 10:22 AM  Temp    Pulse 74 06/19/2018 10:23 AM  Resp 12 06/19/2018 10:23 AM  SpO2 100 % 06/19/2018 10:23 AM  Vitals shown include unvalidated device data.  Last Pain:  Vitals:   06/19/18 0551  TempSrc:   PainSc: 0-No pain      Patients Stated Pain Goal: 3 (75/91/63 [id number])  Complications: No apparent anesthesia complications

## 2018-06-19 NOTE — Progress Notes (Signed)
Looks great No pain See in am

## 2018-06-19 NOTE — Op Note (Signed)
Preoperative diagnosis: Cystocele and mild vault prolapse Postoperative diagnosis: Cystocele and mild vault prolapse Surgery: Cystocele repair and cystoscopy  Surgeon: Dr. Nicki Reaper Natahsa Marian Assistant: Estill Bamberg dancy  The patient has the above diagnosis and consented the above procedure.  Extra care was taken with leg positioning to minimize the risk of compartment syndrome and neuropathy and deep vein thrombosis.  Preoperative antibiotics were given.  Under anesthesia her cystocele was large grade 2.  Her vaginal vault was easily marked with a 3-0 Vicryl suture and was actually well supported.  Under significant tension it would only come down a few centimeters and posteriorly it was not descending whatsoever.  She had narrow exposure in a narrow vagina and she did not need a vault suspension  Because of the apical support and some vaginal cuff dimples it was a little bit more challenging to do careful dissection at the apex.  I did my usual T-shaped anterior vaginal wall incision and sharply mobilized the overlying vaginal epithelium from the underlying pubocervical fascia to the white line bilaterally.  I was pleased with the mobilization at the apex.  I did a 2 layer repair with running 2-0 Vicryl on SH needle not imbricating the bladder neck.  I was diligent in reducing the cystocele and the repair was anatomic and I did not lose length.  At the end of the repair the length anteriorly was excellent.  I cystoscoped the patient.  Cystoscopically she had good repair.  No bladder injury.  Excellent efflux bilaterally.  I trimmed an appropriate amount of anterior vaginal wall mucosa and closed the incision with running 2-0 Vicryl and CT1 needle.  I actually closed in a T-shaped closure also using left and right apical sutures not to leave a significant dogear in the midline.  The closure was not under tension  The end of the case she had excellent length.  She had minimal rectocele.  She had no  cystocele.  Blood loss was less than 100 mL.  I was very pleased with the surgery.  Leg position was good and patient was taken to recovery after vaginal pack with clindamycin cream was applied

## 2018-06-19 NOTE — Anesthesia Procedure Notes (Signed)
Procedure Name: Intubation Date/Time: 06/19/2018 7:52 AM Performed by: Freddrick March, MD Patient Re-evaluated:Patient Re-evaluated prior to induction Oxygen Delivery Method: Circle system utilized Preoxygenation: Pre-oxygenation with 100% oxygen Induction Type: IV induction Ventilation: Mask ventilation without difficulty and Oral airway inserted - appropriate to patient size Laryngoscope Size: Glidescope and 3 Grade View: Grade I Tube type: Oral Tube size: 7.0 mm Number of attempts: 3 Airway Equipment and Method: Video-laryngoscopy Placement Confirmation: ETT inserted through vocal cords under direct vision,  positive ETCO2 and breath sounds checked- equal and bilateral Secured at: 20 cm Tube secured with: Tape Dental Injury: Teeth and Oropharynx as per pre-operative assessment  Difficulty Due To: Difficulty was unanticipated and Difficult Airway- due to anterior larynx Future Recommendations: Recommend- induction with short-acting agent, and alternative techniques readily available Comments: Attempted with [employer].  Glidescope on 3rd attempt

## 2018-06-20 ENCOUNTER — Encounter (HOSPITAL_COMMUNITY): Payer: Self-pay | Admitting: Urology

## 2018-06-20 DIAGNOSIS — N8110 Cystocele, unspecified: Secondary | ICD-10-CM | POA: Diagnosis not present

## 2018-06-20 LAB — BASIC METABOLIC PANEL
ANION GAP: 6 (ref 5–15)
BUN: 14 mg/dL (ref 8–23)
CHLORIDE: 107 mmol/L (ref 98–111)
CO2: 24 mmol/L (ref 22–32)
Calcium: 8.4 mg/dL — ABNORMAL LOW (ref 8.9–10.3)
Creatinine, Ser: 0.85 mg/dL (ref 0.44–1.00)
GFR calc Af Amer: >60 mL/min (ref >60)
GFR calc non Af Amer: >60 mL/min (ref >60)
Glucose, Bld: 136 mg/dL — ABNORMAL HIGH (ref 70–99)
Potassium: 4.2 mmol/L (ref 3.5–5.1)
Sodium: 137 mmol/L (ref 135–145)

## 2018-06-20 LAB — HEMOGLOBIN AND HEMATOCRIT, BLOOD
HCT: 37.9 % (ref 36.0–46.0)
Hemoglobin: 12.3 g/dL (ref 12.0–15.0)

## 2018-06-20 NOTE — Anesthesia Postprocedure Evaluation (Signed)
Anesthesia Post Note  Patient: Jacqueline Kaiser  Procedure(s) Performed: CYSTOSCOPY ANTERIOR REPAIR (CYSTOCELE) (N/A )     Patient location during evaluation: PACU Anesthesia Type: General Level of consciousness: awake and alert Pain management: pain level controlled Vital Signs Assessment: post-procedure vital signs reviewed and stable Respiratory status: spontaneous breathing, nonlabored ventilation, respiratory function stable and patient connected to nasal cannula oxygen Cardiovascular status: blood pressure returned to baseline and stable Postop Assessment: no apparent nausea or vomiting Anesthetic complications: no    Last Vitals:  Vitals:   06/19/18 2353 06/20/18 0506  BP: (!) 104/54 (!) 96/58  Pulse: 70 67  Resp: 16 14  Temp: 37.1 C 37.1 C  SpO2: 93% 94%    Last Pain:  Vitals:   06/20/18 0900  TempSrc:   PainSc: 0-No pain   Pain Goal: Patients Stated Pain Goal: 0 (06/20/18 0259)                 Suesan Mohrmann L Zethan Alfieri

## 2018-06-20 NOTE — Discharge Summary (Signed)
Date of admission: 06/19/2018  Date of discharge: 06/20/2018  Admission diagnosis: midline cystocele  Discharge diagnosis: midline cystocele  Secondary diagnoses: mild vault prolapse  History and Physical: For full details, please see admission history and physical. Briefly, TAHJANAE BLANKENBURG is a 73 y.o. year old patient with above diagnosis.   Hospital Course: Cystocele repair and cysto; good post op coures  Laboratory values:  Recent Labs    06/20/18 0514  HGB 12.3  HCT 37.9   Recent Labs    06/19/18 1606 06/20/18 0514  CREATININE 0.87 0.85    Disposition: Home  Discharge instruction: The patient was instructed to be ambulatory but told to refrain from heavy lifting, strenuous activity, or driving. Detailed  Discharge medications:  Allergies as of 06/20/2018      Reactions   Betadine [povidone Iodine] Other (See Comments)   Burns   Iodine       Medication List    STOP taking these medications   CALCIUM CITRATE+D3 PETITES PO   vitamin B-12 1000 MCG tablet Commonly known as:  CYANOCOBALAMIN   vitamin C 1000 MG tablet   Vitamin D3 50 MCG (2000 UT) Tabs     TAKE these medications   cetirizine 10 MG tablet Commonly known as:  ZYRTEC Take 10 mg by mouth daily as needed (sinus/allergies.).   conjugated estrogens vaginal cream Commonly known as:  Premarin Place 1 Applicatorful vaginally daily. 3 times weekly   HYDROcodone-acetaminophen 5-325 MG tablet Commonly known as:  Norco Take 1-2 tablets by mouth every 6 (six) hours as needed for moderate pain or severe pain.   meclizine 12.5 MG tablet Commonly known as:  ANTIVERT Take 1 tablet (12.5 mg total) by mouth 4 (four) times daily as needed for dizziness.   mirabegron ER 25 MG Tb24 tablet Commonly known as:  MYRBETRIQ Take 1 tablet (25 mg total) by mouth daily.   Prolia 60 MG/ML Soln injection Generic drug:  denosumab Inject 1 each into the skin every 6 (six) months.   rosuvastatin 20 MG  tablet Commonly known as:  Crestor Take 1 tablet (20 mg total) by mouth daily. What changed:  when to take this   tolterodine 4 MG 24 hr capsule Commonly known as:  Detrol LA Take 1 capsule (4 mg total) by mouth daily.   traZODone 50 MG tablet Commonly known as:  DESYREL Take 1 tablet (50 mg total) by mouth at bedtime as needed for sleep.       Followup:  Follow-up Information    Larson Limones, Nicki Reaper, MD.   Specialty:  Urology Why:  office will call you with date and time of appt.  Contact information: [place] [address] [phone]        Bjorn Loser, MD Follow up.   Specialty:  Urology Contact information: [place] [address] [phone]

## 2018-06-20 NOTE — Progress Notes (Signed)
Looks great  No pain Void trial Detailed post op

## 2018-06-20 NOTE — Discharge Instructions (Signed)
I have reviewed discharge instructions in detail with the patient. They will follow-up with me or their physician as scheduled. My nurse will also be calling the patients as per protocol. As discussed with Dr. Keryl Gholson. ° °You may resume aspirin, advil, aleve, vitamins, and supplements 7 days after surgery. °

## 2018-06-20 NOTE — Progress Notes (Signed)
FC and vaginal packing removed per MD order. Pt able to void 100 ml X2, bladder scan done now showing 0 ml. Pt has showered, is very eager to be D/C'd. Denies pain and discomfort. Husband at bedside. MD MacDiarmid made aware and gave OK to D/C pt.

## 2018-07-02 ENCOUNTER — Ambulatory Visit: Payer: PPO | Admitting: Urology

## 2018-07-10 ENCOUNTER — Ambulatory Visit: Payer: PPO | Admitting: Family Medicine

## 2018-08-12 NOTE — Progress Notes (Signed)
08/13/2018 11:10 AM   [name] 03/12/1946 [id number]  Referring provider: Steele Sizer, MD [address] [place], [place] [address]  Chief Complaint  Patient presents with  . Routine Post Op    HPI: Mrs. Sherwood is a 73 year old Caucasian female who is s/p cystocele repair and cystoscopy on 06/19/2018 who presents today for follow up.   She underwent a cystocele repair on 06/19/2018 with Dr. Matilde Sprang.  Her post operative course was as expected and uneventful.    She states that she has been doing well since her surgery.  She is very happy with her results and the care that she has received with our office and [facility].    Her only urinary complaint at this time is nocturia that happens on occasion.  It is not bothersome to her at this time.  She is no longer taking the Myrbetriq and does not feel the need to restart the medication at this time.    Her biggest complaint was feeling like things were falling out of her, but she no longer feels this after the surgery.  Patient denies any gross hematuria, dysuria or suprapubic/flank pain.  Patient denies any fevers, chills, nausea or vomiting.  Her PVR is 5 mL.    She is also not using the vaginal estrogen cream.  She is not having vaginal irritation or rUTI's.    PMH: Past Medical History:  Diagnosis Date  . Ankle fracture 2014   Left  . Anxiety   . Female cystocele    Grade 2  . Fracture    lower back after sneezing  . GERD (gastroesophageal reflux disease)    History of  . History of vertigo    last attack about 1 week ago  . Hyperlipidemia   . Insomnia   . 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   Right, .4 x 1.3 cm, noted on [place] ABD 05/2016, left lobe of the liver with the largest measuring 9.9 mm in diameter [place] ABD 06/2010  . Low back sprain   . Nonsustained ventricular tachycardia ([place])    by Holter,  noraml Stress test ECHO perpatient [name], [place])  . Obese   . Osteoporosis   . Sinusitis   . Urinary  frequency   . Wrist fracture    Left    Surgical History: Past Surgical History:  Procedure Laterality Date  . ABDOMINAL HYSTERECTOMY    . BREAST BIOPSY Right    negative 10-12 years ago- core  . CESAREAN SECTION    . COLONOSCOPY  2012  . CYSTOCELE REPAIR N/A 06/19/2018   Procedure: CYSTOSCOPY ANTERIOR REPAIR (CYSTOCELE);  Surgeon: Bjorn Loser, MD;  Location: [facility];  Service: Urology;  Laterality: N/A;  . TONSILLECTOMY      Home Medications:  Allergies as of 08/13/2018      Reactions   Betadine [povidone Iodine] Other (See Comments)   Burns   Iodine       Medication List       Accurate as of Aug 13, 2018 11:10 AM. If you have any questions, ask your nurse or doctor.        STOP taking these medications   cetirizine 10 MG tablet Commonly known as:  ZYRTEC Stopped by:  Flower Franko, PA-C   conjugated estrogens vaginal cream Commonly known as:  Premarin Stopped by:  Zara Council, PA-C   HYDROcodone-acetaminophen 5-325 MG tablet Commonly known as:  Norco Stopped by:  Zara Council, PA-C   mirabegron  ER 25 MG Tb24 tablet Commonly known as:  MYRBETRIQ Stopped by:  Zara Council, PA-C   tolterodine 4 MG 24 hr capsule Commonly known as:  Detrol LA Stopped by:  Zara Council, PA-C   traZODone 50 MG tablet Commonly known as:  DESYREL Stopped by:  Zara Council, PA-C     TAKE these medications   meclizine 12.5 MG tablet Commonly known as:  ANTIVERT Take 1 tablet (12.5 mg total) by mouth 4 (four) times daily as needed for dizziness.   Prolia 60 MG/ML Soln injection Generic drug:  denosumab Inject 1 each into the skin every 6 (six) months.   rosuvastatin 20 MG tablet Commonly known as:  Crestor Take 1 tablet (20 mg total) by mouth daily. What changed:  when to take this       Allergies:  Allergies  Allergen Reactions  . Betadine [Povidone Iodine] Other (See Comments)    Burns  . Iodine     Family History: Family History   Problem Relation Age of Onset  . Heart disease Mother        ATRIAL FIB  . Cancer Father 74       Lung Ca,  nonsmoker, metastatic at diagnosis  asbestos exposure  . Breast cancer Daughter 43       dx at age of 43 and 11    Social History:  reports that she quit smoking about 40 years ago. Her smoking use included cigarettes. She has a 5.00 pack-year smoking history. She has never used smokeless tobacco. She reports current alcohol use. She reports that she does not use drugs.  ROS: UROLOGY Frequent Urination?: No Hard to postpone urination?: No Burning/pain with urination?: No Get up at night to urinate?: Yes Leakage of urine?: No Urine stream starts and stops?: No Trouble starting stream?: No Do you have to strain to urinate?: No Blood in urine?: No Urinary tract infection?: No Sexually transmitted disease?: No Injury to kidneys or bladder?: No Painful intercourse?: No Weak stream?: No Currently pregnant?: No Vaginal bleeding?: No Last menstrual period?: Hysterectomy  Gastrointestinal Nausea?: No Vomiting?: No Indigestion/heartburn?: No Diarrhea?: No Constipation?: Yes  Constitutional Fever: No Night sweats?: No Weight loss?: No Fatigue?: No  Skin Skin rash/lesions?: No Itching?: No  Eyes Blurred vision?: No Double vision?: No  Ears/Nose/Throat Sore throat?: No Sinus problems?: No  Hematologic/Lymphatic Swollen glands?: No Easy bruising?: No  Cardiovascular Leg swelling?: No Chest pain?: No  Respiratory Cough?: No Shortness of breath?: No  Endocrine Excessive thirst?: No  Musculoskeletal Back pain?: No Joint pain?: No  Neurological Headaches?: No Dizziness?: No  Psychologic Depression?: No Anxiety?: No  Physical Exam: BP 107/66 (BP Location: Left Arm, Patient Position: Sitting, Cuff Size: Normal)   Pulse 74   Ht 5\' 7"  (1.702 m)   Wt 160 lb (72.6 kg)   BMI 25.06 kg/m   Constitutional:  Well nourished. Alert and oriented, No  acute distress. HEENT: [place] AT, moist mucus membranes.  Trachea midline, no masses. Cardiovascular: No clubbing, cyanosis, or edema. Respiratory: Normal respiratory effort, no increased work of breathing. GI: Abdomen is soft, non tender, non distended, no abdominal masses. Liver and spleen not palpable.  No hernias appreciated.  Stool sample for occult testing is not indicated.   GU: No CVA tenderness.  No bladder fullness or masses.  Atrophic external genitalia, sparse pubic hair distribution, no lesions.  Normal urethral meatus, no lesions, no prolapse, no discharge.   No urethral masses, tenderness and/or tenderness. No bladder fullness,  tenderness or masses. pale vagina mucosa, fair estrogen effect, no discharge, no lesions, good pelvic support, no cystocele and minimal rectocele noted.  Cervix, uterus and adnexa are surgically absent.   Anus and perineum are without rashes or lesions.  Skin: No rashes, bruises or suspicious lesions. Lymph: No inguinal adenopathy. Neurologic: Grossly intact, no focal deficits, moving all 4 extremities. Psychiatric: Normal mood and affect.   Laboratory Data: Lab Results  Component Value Date   WBC 4.5 06/15/2018   HGB 12.3 06/20/2018   HCT 37.9 06/20/2018   MCV 97.6 06/15/2018   PLT 198 06/15/2018    Lab Results  Component Value Date   CREATININE 0.85 06/20/2018    No results found for: PSA  No results found for: TESTOSTERONE  No results found for: HGBA1C  Lab Results  Component Value Date   TSH 2.78 02/11/2016       Component Value Date/Time   CHOL 246 (H) 01/08/2018 1222   HDL 79 01/08/2018 1222   CHOLHDL 3.1 01/08/2018 1222   VLDL 27.8 02/11/2016 1152   LDLCALC 149 (H) 01/08/2018 1222    Lab Results  Component Value Date   AST 18 01/08/2018   Lab Results  Component Value Date   ALT 9 01/08/2018   No components found for: ALKALINEPHOPHATASE No components found for: BILIRUBINTOTAL  No results found for: ESTRADIOL   Urinalysis    Component Value Date/Time   COLORURINE YELLOW 03/22/2016 0957   APPEARANCEUR Clear 04/24/2018 1022   LABSPEC 1.010 03/22/2016 0957   PHURINE 5.0 03/22/2016 0957   GLUCOSEU Negative 04/24/2018 1022   GLUCOSEU NEGATIVE 03/22/2016 0957   HGBUR NEGATIVE 03/22/2016 0957   BILIRUBINUR Negative 04/24/2018 1022   KETONESUR NEGATIVE 03/22/2016 0957   PROTEINUR Negative 04/24/2018 1022   UROBILINOGEN 0.2 03/22/2016 0957   NITRITE Negative 04/24/2018 1022   NITRITE NEGATIVE 03/22/2016 0957   LEUKOCYTESUR Trace (A) 04/24/2018 1022    I have reviewed the labs.   Pertinent Imaging: Results for ALLA, [facility]OMA (MRN [id number]) as of 08/13/2018 13:04  Ref. Range 08/13/2018 10:44  Scan Result Unknown 89mL    Assessment & Plan:    1. Cystocele - s/p cystocele repair  - doing well - extremely pleased with results  2. Mixed incontinence - very minimal bother  3. Vaginal atrophy - does not want to continue vaginal estrogen cream   Return in about 3 months (around 11/13/2018) for with Dr. Matilde Sprang .  These notes generated with voice recognition software. I apologize for typographical errors.  Zara Council, PA-C  [facility] [address]  [place], [place] [address] [phone]

## 2018-08-13 ENCOUNTER — Ambulatory Visit (INDEPENDENT_AMBULATORY_CARE_PROVIDER_SITE_OTHER): Payer: PPO | Admitting: Urology

## 2018-08-13 ENCOUNTER — Encounter: Payer: Self-pay | Admitting: Urology

## 2018-08-13 ENCOUNTER — Other Ambulatory Visit: Payer: Self-pay

## 2018-08-13 ENCOUNTER — Ambulatory Visit: Payer: PPO | Admitting: Urology

## 2018-08-13 VITALS — BP 107/66 | HR 74 | Ht 67.0 in | Wt 160.0 lb

## 2018-08-13 DIAGNOSIS — N952 Postmenopausal atrophic vaginitis: Secondary | ICD-10-CM | POA: Diagnosis not present

## 2018-08-13 DIAGNOSIS — N3946 Mixed incontinence: Secondary | ICD-10-CM | POA: Diagnosis not present

## 2018-08-13 DIAGNOSIS — N8111 Cystocele, midline: Secondary | ICD-10-CM | POA: Diagnosis not present

## 2018-08-13 LAB — BLADDER SCAN AMB NON-IMAGING

## 2018-10-29 ENCOUNTER — Other Ambulatory Visit: Payer: Self-pay | Admitting: Family Medicine

## 2018-10-29 DIAGNOSIS — E7800 Pure hypercholesterolemia, unspecified: Secondary | ICD-10-CM

## 2018-10-30 NOTE — Telephone Encounter (Signed)
Pt informed script has been sent to pharmacy and pt did schedule appt

## 2018-11-19 ENCOUNTER — Ambulatory Visit: Payer: PPO | Admitting: Urology

## 2018-11-19 ENCOUNTER — Encounter: Payer: Self-pay | Admitting: Urology

## 2018-11-19 ENCOUNTER — Other Ambulatory Visit: Payer: Self-pay

## 2018-11-19 VITALS — BP 112/72 | HR 68 | Ht 67.0 in | Wt 170.0 lb

## 2018-11-19 DIAGNOSIS — N8111 Cystocele, midline: Secondary | ICD-10-CM | POA: Diagnosis not present

## 2018-11-19 NOTE — Progress Notes (Signed)
11/19/2018 8:37 AM   Jacqueline Kaiser February 28, 1946 [id number]  Referring provider: Steele Sizer, MD [address] [place] Jacqu[place] Kaiser,  [place] [address]  No chief complaint on file.   HPI: Patient has cystocele repair on June 19, 2018.  She saw our nurse practitioner in May and she was pleased with the repair.  She was having some nocturia and was no longer on Myrbetriq and she did not feel she still needed it.  She was no longer using vaginal estrogen cream and did not feel she needed either medication.  Bladder little bit urgent but otherwise doing well.  No pain.  No incontinence.  Frequency stable  On vaginal examination she had very good support anteriorly posteriorly with good vaginal length and no stress incontinence.  She had some poor estrogenization changes near the urethra.  PMH: Past Medical History:  Diagnosis Date  . Ankle fracture 2014   Left  . Anxiety   . Female cystocele    Grade 2  . Fracture    lower back after sneezing  . GERD (gastroesophageal reflux disease)    History of  . History of vertigo    last attack about 1 week ago  . Hyperlipidemia   . Insomnia   . 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   Right, .4 x 1.3 cm, noted on [place] ABD 05/2016, left lobe of the liver with the largest measuring 9.9 mm in diameter [place] ABD 06/2010  . Low back sprain   . Nonsustained ventricular tachycardia ([place])    by Holter,  noraml Stress test ECHO perpatient Jacqueline Kaiser, [place])  . Obese   . Osteoporosis   . Sinusitis   . Urinary frequency   . Wrist fracture    Left    Surgical History: Past Surgical History:  Procedure Laterality Date  . ABDOMINAL HYSTERECTOMY    . BREAST BIOPSY Right    negative 10-12 years ago- core  . CESAREAN SECTION    . COLONOSCOPY  2012  . CYSTOCELE REPAIR N/A 06/19/2018   Procedure: CYSTOSCOPY ANTERIOR REPAIR (CYSTOCELE);  Surgeon: Bjorn Loser, MD;  Location: [facility];  Service: Urology;  Laterality: N/A;  . TONSILLECTOMY      Home Medications:   Allergies as of 11/19/2018      Reactions   Betadine [povidone Iodine] Other (See Comments)   Burns   Iodine       Medication List       Accurate as of November 19, 2018  8:37 AM. If you have any questions, ask your nurse or doctor.        meclizine 12.5 MG tablet Commonly known as: ANTIVERT Take 1 tablet (12.5 mg total) by mouth 4 (four) times daily as needed for dizziness.   Prolia 60 MG/ML Soln injection Generic drug: denosumab Inject 1 each into the skin every 6 (six) months.   rosuvastatin 20 MG tablet Commonly known as: CRESTOR Take 1 tablet (20 mg total) by mouth every evening.       Allergies:  Allergies  Allergen Reactions  . Betadine [Povidone Iodine] Other (See Comments)    Burns  . Iodine     Family History: Family History  Problem Relation Age of Onset  . Heart disease Mother        ATRIAL FIB  . Cancer Father 40       Lung Ca,  nonsmoker, metastatic at diagnosis  asbestos exposure  . Breast cancer Daughter 15       dx at age of  49 and 52    Social History:  reports that she quit smoking about 40 years ago. Her smoking use included cigarettes. She has a 5.00 pack-year smoking history. She has never used smokeless tobacco. She reports current alcohol use. She reports that she does not use drugs.  ROS:                                        Physical Exam: BP 112/72 (BP Location: Left Arm, Patient Position: Sitting)   Pulse 68   Ht 5\' 7"  (1.702 m)   Wt 170 lb (77.1 kg)   BMI 26.63 kg/m   Constitutional:  Alert and oriented, No acute distress.   Laboratory Data: Lab Results  Component Value Date   WBC 4.5 06/15/2018   HGB 12.3 06/20/2018   HCT 37.9 06/20/2018   MCV 97.6 06/15/2018   PLT 198 06/15/2018    Lab Results  Component Value Date   CREATININE 0.85 06/20/2018    No results found for: PSA  No results found for: TESTOSTERONE  No results found for: HGBA1C  Urinalysis    Component Value Date/Time    COLORURINE YELLOW 03/22/2016 0957   APPEARANCEUR Clear 04/24/2018 1022   LABSPEC 1.010 03/22/2016 0957   PHURINE 5.0 03/22/2016 0957   GLUCOSEU Negative 04/24/2018 1022   GLUCOSEU NEGATIVE 03/22/2016 0957   HGBUR NEGATIVE 03/22/2016 0957   BILIRUBINUR Negative 04/24/2018 1022   KETONESUR NEGATIVE 03/22/2016 0957   PROTEINUR Negative 04/24/2018 1022   UROBILINOGEN 0.2 03/22/2016 0957   NITRITE Negative 04/24/2018 1022   NITRITE NEGATIVE 03/22/2016 0957   LEUKOCYTESUR Trace (A) 04/24/2018 1022    Pertinent Imaging:   Assessment & Plan: Overall doing well I will see her on a as needed basis  There are no diagnoses linked to this encounter.  No follow-ups on file.  Jacqueline Packer, MD  [place] [address], [place],  [address] [phone]

## 2018-11-23 DIAGNOSIS — M810 Age-related osteoporosis without current pathological fracture: Secondary | ICD-10-CM | POA: Diagnosis not present

## 2018-12-03 ENCOUNTER — Other Ambulatory Visit: Payer: Self-pay

## 2018-12-03 ENCOUNTER — Encounter: Payer: Self-pay | Admitting: Family Medicine

## 2018-12-03 ENCOUNTER — Ambulatory Visit (INDEPENDENT_AMBULATORY_CARE_PROVIDER_SITE_OTHER): Payer: PPO | Admitting: Family Medicine

## 2018-12-03 VITALS — BP 112/62 | HR 84 | Temp 96.8°F | Resp 16 | Ht 66.0 in | Wt 175.6 lb

## 2018-12-03 DIAGNOSIS — F5101 Primary insomnia: Secondary | ICD-10-CM

## 2018-12-03 DIAGNOSIS — E7800 Pure hypercholesterolemia, unspecified: Secondary | ICD-10-CM

## 2018-12-03 DIAGNOSIS — R944 Abnormal results of kidney function studies: Secondary | ICD-10-CM

## 2018-12-03 DIAGNOSIS — K219 Gastro-esophageal reflux disease without esophagitis: Secondary | ICD-10-CM

## 2018-12-03 DIAGNOSIS — Z23 Encounter for immunization: Secondary | ICD-10-CM

## 2018-12-03 DIAGNOSIS — L723 Sebaceous cyst: Secondary | ICD-10-CM | POA: Diagnosis not present

## 2018-12-03 DIAGNOSIS — M810 Age-related osteoporosis without current pathological fracture: Secondary | ICD-10-CM | POA: Diagnosis not present

## 2018-12-03 MED ORDER — ROSUVASTATIN CALCIUM 20 MG PO TABS: 20.0000 mg | ORAL_TABLET | Freq: Every evening | ORAL | 1 refills | Status: DC

## 2018-12-03 NOTE — Progress Notes (Signed)
Name: Jacqueline Kaiser   MRN: [id number]    DOB: Mar 20, 1946   Date:12/03/2018       Progress Note  Subjective  Chief Complaint  Chief Complaint  Patient presents with  . Medication Refill  . Gastroesophageal Reflux    Has not had any problems  . Insomnia  . Hyperlipidemia  . Chronic lumbar spine pain  . Mass    Would like Dr. Ancil Boozer to look at it.    HPI  Osteoporosis history of vertebral compression fracture: she went to [facility], vitamin D and basic metabolic panel was normal. She is still getting Prolia with Dr. Gabriel Carina and denies side effects of back pain. Last dose was 05/2018   Chronic lumbar spine pain: she states it has been going on for years, however fall of 2018 pain was severe and did not improve with chiropractor care, she was sent to [place] and found to have radiculitis..She is doing well without medication at this time. She only takes Naproxen sporadically for aches and pains now. No recent episodes of radiculitis   GERD: currently only taking medication prn, stress level is down and she eats healthy. Denies regurgitation   Hyperlipidemia: taking Crestor, denies myalgia, chest pain or palpitation. Needs refill of medication   Insomnia: she states she falls asleep without problems, but tends to wake up in the middle of the night to void and moves to the living room where she turns on the TV and falls back asleep within 10-20 minutes. She states she was taking Trazodone but did not work for her, she goes back to watch TV and falls asleep   Bump on left buttocks: going on for the past two weeks, tender/uncomfortable to touch, no oozing  Patient Active Problem List   Diagnosis Date Noted  . Cystocele with prolapse 06/19/2018  . Age-related osteoporosis without current pathological fracture 01/08/2018  . History of vertebral compression fracture 05/23/2017  . Incomplete bladder emptying 05/16/2016  . GERD (gastroesophageal reflux disease) 02/14/2016   . Insomnia due to anxiety and fear 02/14/2016  . Allergy 07/01/2013  . Osteoporosis of lumbar spine 05/19/2013  . Overweight (BMI 25.0-29.9) 08/28/2011  . Peripheral vertigo 08/28/2011  . Nonsustained ventricular tachycardia ([place])   . Hyperlipidemia     Past Surgical History:  Procedure Laterality Date  . ABDOMINAL HYSTERECTOMY    . BREAST BIOPSY Right    negative 10-12 years ago- core  . CESAREAN SECTION    . COLONOSCOPY  2012  . CYSTOCELE REPAIR N/A 06/19/2018   Procedure: CYSTOSCOPY ANTERIOR REPAIR (CYSTOCELE);  Surgeon: Bjorn Loser, MD;  Location: [facility];  Service: Urology;  Laterality: N/A;  . TONSILLECTOMY      Family History  Problem Relation Age of Onset  . Heart disease Mother        ATRIAL FIB  . Cancer Father 14       Lung Ca,  nonsmoker, metastatic at diagnosis  asbestos exposure  . Breast cancer Daughter 41       dx at age of 4 and 48    Social History   Socioeconomic History  . Marital status: Married    Spouse name: Octavia Bruckner  . Number of children: 2  . Years of education: Not on file  . Highest education level: 12th grade  Occupational History  . Occupation: Retired  Scientific laboratory technician  . Financial resource strain: Not hard at all  . Food insecurity    Worry: Never true    Inability: Never  true  . Transportation needs    Medical: No    Non-medical: No  Tobacco Use  . Smoking status: Former Smoker    Packs/day: 0.50    Years: 10.00    Pack years: 5.00    Types: Cigarettes    Quit date: 1980    Years since quitting: 40.6  . Smokeless tobacco: Never Used  . Tobacco comment: smoking cessation materials not required  Substance and Sexual Activity  . Alcohol use: Yes    Comment: wine occasionally  . Drug use: No  . Sexual activity: Not Currently    Birth control/protection: Surgical  Lifestyle  . Physical activity    Days per week: 4 days    Minutes per session: 50 min  . Stress: Not at all  Relationships  . Social Herbalist on  phone: Patient refused    Gets together: Patient refused    Attends religious service: Patient refused    Active member of club or organization: Patient refused    Attends meetings of clubs or organizations: Patient refused    Relationship status: Married  . Intimate partner violence    Fear of current or ex partner: No    Emotionally abused: No    Physically abused: No    Forced sexual activity: No  Other Topics Concern  . Not on file  Social History Narrative  . Not on file     Current Outpatient Medications:  .  Ascorbic Acid (VITAMIN C) 100 MG tablet, Take 100 mg by mouth daily., Disp: , Rfl:  .  cholecalciferol (VITAMIN D3) 25 MCG (1000 UT) tablet, Take 1,000 Units by mouth daily., Disp: , Rfl:  .  meclizine (ANTIVERT) 12.5 MG tablet, Take 1 tablet (12.5 mg total) by mouth 4 (four) times daily as needed for dizziness., Disp: 30 tablet, Rfl: 0 .  PROLIA 60 MG/ML SOLN injection, Inject 1 each into the skin every 6 (six) months. , Disp: , Rfl:  .  rosuvastatin (CRESTOR) 20 MG tablet, Take 1 tablet (20 mg total) by mouth every evening., Disp: 90 tablet, Rfl: 0 .  vitamin B-12 (CYANOCOBALAMIN) 500 MCG tablet, Take 500 mcg by mouth daily., Disp: , Rfl:   Allergies  Allergen Reactions  . Betadine [Povidone Iodine] Other (See Comments)    Burns  . Iodine     I personally reviewed active problem list, medication list, allergies, family history, social history, health maintenance with the patient/caregiver today.   ROS  Constitutional: Negative for fever or weight change.  Respiratory: Negative for cough and shortness of breath.   Cardiovascular: Negative for chest pain or palpitations.  Gastrointestinal: Negative for abdominal pain, no bowel changes.  Musculoskeletal: Negative for gait problem or joint swelling.  Skin: Negative for rash.  Neurological: Negative for dizziness or headache.  No other specific complaints in a complete review of systems (except as listed in HPI  above).  Objective  Vitals:   12/03/18 1436  BP: 112/62  Pulse: 84  Resp: 16  Temp: (!) 96.8 F (36 C)  TempSrc: Temporal  SpO2: 99%  Weight: 175 lb 9.6 oz (79.7 kg)  Height: 5\' 6"  (1.676 m)    Body mass index is 28.34 kg/m.  Physical Exam  Constitutional: Patient appears well-developed and well-nourished. Overweight. No distress.  HEENT: head atraumatic, normocephalic, pupils equal and reactive to light Cardiovascular: Normal rate, regular rhythm and normal heart sounds.  No murmur heard. No BLE edema. Pulmonary/Chest: Effort normal and breath sounds  normal. No respiratory distress. Abdominal: Soft.  There is no tenderness. Skin: small indurated area on left buttocks near the vulva area. Small black eye, slightly tender to touch  Psychiatric: Patient has a normal mood and affect. behavior is normal. Judgment and thought content normal.  PHQ2/9: Depression screen [facility] 2/9 12/03/2018 01/08/2018 01/04/2018 08/30/2017 05/23/2017  Decreased Interest 0 0 0 0 0  Down, Depressed, Hopeless 0 0 0 0 0  PHQ - 2 Score 0 0 0 0 0  Altered sleeping 1 2 0 2 -  Tired, decreased energy 0 0 0 0 -  Change in appetite 0 0 0 0 -  Feeling bad or failure about yourself  0 0 0 0 -  Trouble concentrating 0 0 0 0 -  Moving slowly or fidgety/restless 0 0 0 - -  Suicidal thoughts 0 0 0 0 -  PHQ-9 Score 1 2 0 2 -  Difficult doing work/chores Not difficult at all Not difficult at all Not difficult at all Not difficult at all -    phq 9 is negative   Fall Risk: Fall Risk  12/03/2018 01/08/2018 01/04/2018 08/30/2017 05/23/2017  Falls in the past year? 0 Yes No Yes -  Number falls in past yr: 0 2 or more - 1 -  Comment - February 2019 - - -  Injury with Fall? 0 (No Data) - Yes Yes  Comment - Hurt her wrist - Right Wrist fell in gym shower -  Risk Factor Category  - - - - High Fall Risk  Comment - - - - recent fall  Risk for fall due to : - History of fall(s) Impaired vision - -  Risk for fall due to:  Comment - - wears eyeglasses - -  Follow up - Falls prevention discussed - - -     Functional Status Survey: Is the patient deaf or have difficulty hearing?: No Does the patient have difficulty seeing, even when wearing glasses/contacts?: Yes Does the patient have difficulty concentrating, remembering, or making decisions?: No Does the patient have difficulty walking or climbing stairs?: No Does the patient have difficulty dressing or bathing?: No Does the patient have difficulty doing errands alone such as visiting a doctor's office or shopping?: No   Assessment & Plan  1. Needs flu shot  - Flu Vaccine QUAD High Dose(Fluad)  2. Primary insomnia   3. Age-related osteoporosis without current pathological fracture  - COMPLETE METABOLIC PANEL WITH GFR  4. Pure hypercholesterolemia  - Lipid panel  5. Gastroesophageal reflux disease without esophagitis  Doing well at this time  6. Decreased GFR  - COMPLETE METABOLIC PANEL WITH GFR  7. Sebaceous cyst  Consent signed: YES  Procedure: removal cyst less than 1 cm in size , it had a black and brown drainage with a cystic sac that was removed Location: right buttocks, near vulva  Equipment used: 18 gauge needle  Anesthesia:  None  Cleaned and prepped: Betadine and alcohol

## 2018-12-04 ENCOUNTER — Other Ambulatory Visit: Payer: Self-pay | Admitting: Family Medicine

## 2018-12-04 DIAGNOSIS — Z1231 Encounter for screening mammogram for malignant neoplasm of breast: Secondary | ICD-10-CM

## 2018-12-21 ENCOUNTER — Other Ambulatory Visit: Payer: Self-pay | Admitting: Family Medicine

## 2018-12-21 DIAGNOSIS — M810 Age-related osteoporosis without current pathological fracture: Secondary | ICD-10-CM | POA: Diagnosis not present

## 2018-12-21 DIAGNOSIS — E7800 Pure hypercholesterolemia, unspecified: Secondary | ICD-10-CM | POA: Diagnosis not present

## 2018-12-21 DIAGNOSIS — R944 Abnormal results of kidney function studies: Secondary | ICD-10-CM | POA: Diagnosis not present

## 2018-12-22 LAB — LIPID PANEL
Cholesterol: 203 mg/dL — ABNORMAL HIGH (ref <200)
HDL: 72 mg/dL (ref >=50)
LDL Cholesterol (Calc): 114 mg/dL (calc) — ABNORMAL HIGH
Non-HDL Cholesterol (Calc): 131 mg/dL (calc) — ABNORMAL HIGH (ref <130)
Total CHOL/HDL Ratio: 2.8 (calc) (ref <5.0)
Triglycerides: 77 mg/dL (ref <150)

## 2018-12-22 LAB — COMPLETE METABOLIC PANEL WITHOUT GFR
AG Ratio: 2.2 (calc) (ref 1.0–2.5)
ALT: 8 U/L (ref 6–29)
AST: 17 U/L (ref 10–35)
Albumin: 4.3 g/dL (ref 3.6–5.1)
Alkaline phosphatase (APISO): 41 U/L (ref 37–153)
BUN/Creatinine Ratio: 17 (calc) (ref 6–22)
BUN: 17 mg/dL (ref 7–25)
CO2: 27 mmol/L (ref 20–32)
Calcium: 9.4 mg/dL (ref 8.6–10.4)
Chloride: 105 mmol/L (ref 98–110)
Creat: 1.02 mg/dL — ABNORMAL HIGH (ref 0.60–0.93)
GFR, Est African American: 62 mL/min/1.73m2
GFR, Est Non African American: 54 mL/min/1.73m2 — ABNORMAL LOW
Globulin: 2 g/dL (ref 1.9–3.7)
Glucose, Bld: 102 mg/dL — ABNORMAL HIGH (ref 65–99)
Potassium: 4.8 mmol/L (ref 3.5–5.3)
Sodium: 141 mmol/L (ref 135–146)
Total Bilirubin: 0.6 mg/dL (ref 0.2–1.2)
Total Protein: 6.3 g/dL (ref 6.1–8.1)

## 2019-01-07 ENCOUNTER — Ambulatory Visit
Admission: RE | Admit: 2019-01-07 | Discharge: 2019-01-07 | Disposition: A | Discharge status: Home / Self Care | Payer: PPO | Source: Ambulatory Visit | Attending: Family Medicine | Admitting: Family Medicine

## 2019-01-07 DIAGNOSIS — Z1231 Encounter for screening mammogram for malignant neoplasm of breast: Secondary | ICD-10-CM

## 2019-01-08 ENCOUNTER — Ambulatory Visit (INDEPENDENT_AMBULATORY_CARE_PROVIDER_SITE_OTHER): Payer: PPO

## 2019-01-08 ENCOUNTER — Other Ambulatory Visit: Payer: Self-pay

## 2019-01-08 VITALS — BP 108/70 | HR 61 | Temp 96.8°F | Resp 16 | Ht 67.0 in

## 2019-01-08 DIAGNOSIS — Z0000 Encounter for general adult medical examination without abnormal findings: Secondary | ICD-10-CM | POA: Diagnosis not present

## 2019-01-08 NOTE — Patient Instructions (Signed)
Jacqueline Kaiser , Thank you for taking time to come for your Medicare Wellness Visit. I appreciate your ongoing commitment to your health goals. Please review the following plan we discussed and let me know if I can assist you in the future.   Screening recommendations/referrals: Colonoscopy: Cologuard done 02/17/18 Mammogram: done 01/07/19 Bone Density: done 06/20/17 Recommended yearly ophthalmology/optometry visit for glaucoma screening and checkup Recommended yearly dental visit for hygiene and checkup  Vaccinations: Influenza vaccine: done 12/03/18 Pneumococcal vaccine: done 08/30/17 Tdap vaccine: done 10/30/18 Shingles vaccine: First dose of Shingrix complete. Due for second dose.     Conditions/risks identified: Recommend increasing physical activity to at least 3 days per week  Next appointment: Please follow up in one year for your Medicare Annual Wellness visit.     Preventive Care 75 Years and Older, Female Preventive care refers to lifestyle choices and visits with your health care provider that can promote health and wellness. What does preventive care include?  A yearly physical exam. This is also called an annual well check.  Dental exams once or twice a year.  Routine eye exams. Ask your health care provider how often you should have your eyes checked.  Personal lifestyle choices, including:  Daily care of your teeth and gums.  Regular physical activity.  Eating a healthy diet.  Avoiding tobacco and drug use.  Limiting alcohol use.  Practicing safe sex.  Taking low-dose aspirin every day.  Taking vitamin and mineral supplements as recommended by your health care provider. What happens during an annual well check? The services and screenings done by your health care provider during your annual well check will depend on your age, overall health, lifestyle risk factors, and family history of disease. Counseling  Your health care provider may ask you questions  about your:  Alcohol use.  Tobacco use.  Drug use.  Emotional well-being.  Home and relationship well-being.  Sexual activity.  Eating habits.  History of falls.  Memory and ability to understand (cognition).  Work and work Statistician.  Reproductive health. Screening  You may have the following tests or measurements:  Height, weight, and BMI.  Blood pressure.  Lipid and cholesterol levels. These may be checked every 5 years, or more frequently if you are over 59 years old.  Skin check.  Lung cancer screening. You may have this screening every year starting at age 53 if you have a 30-pack-year history of smoking and currently smoke or have quit within the past 15 years.  Fecal occult blood test (FOBT) of the stool. You may have this test every year starting at age 39.  Flexible sigmoidoscopy or colonoscopy. You may have a sigmoidoscopy every 5 years or a colonoscopy every 10 years starting at age 52.  Hepatitis C blood test.  Hepatitis B blood test.  Sexually transmitted disease (STD) testing.  Diabetes screening. This is done by checking your blood sugar (glucose) after you have not eaten for a while (fasting). You may have this done every 1-3 years.  Bone density scan. This is done to screen for osteoporosis. You may have this done starting at age 14.  Mammogram. This may be done every 1-2 years. Talk to your health care provider about how often you should have regular mammograms. Talk with your health care provider about your test results, treatment options, and if necessary, the need for more tests. Vaccines  Your health care provider may recommend certain vaccines, such as:  Influenza vaccine. This is recommended every year.  Tetanus, diphtheria, and acellular pertussis (Tdap, Td) vaccine. You may need a Td booster every 10 years.  Zoster vaccine. You may need this after age 31.  Pneumococcal 13-valent conjugate (PCV13) vaccine. One dose is  recommended after age 15.  Pneumococcal polysaccharide (PPSV23) vaccine. One dose is recommended after age 79. Talk to your health care provider about which screenings and vaccines you need and how often you need them. This information is not intended to replace advice given to you by your health care provider. Make sure you discuss any questions you have with your health care provider. Document Released: 04/17/2015 Document Revised: 12/09/2015 Document Reviewed: 01/20/2015 Elsevier Interactive Patient Education  2017 [place] Prevention in the Home Falls can cause injuries. They can happen to people of all ages. There are many things you can do to make your home safe and to help prevent falls. What can I do on the outside of my home?  Regularly fix the edges of walkways and driveways and fix any cracks.  Remove anything that might make you trip as you walk through a door, such as a raised step or threshold.  Trim any bushes or trees on the path to your home.  Use bright outdoor lighting.  Clear any walking paths of anything that might make someone trip, such as rocks or tools.  Regularly check to see if handrails are loose or broken. Make sure that both sides of any steps have handrails.  Any raised decks and porches should have guardrails on the edges.  Have any leaves, snow, or ice cleared regularly.  Use sand or salt on walking paths during winter.  Clean up any spills in your garage right away. This includes oil or grease spills. What can I do in the bathroom?  Use night lights.  Install grab bars by the toilet and in the tub and shower. Do not use towel bars as grab bars.  Use non-skid mats or decals in the tub or shower.  If you need to sit down in the shower, use a plastic, non-slip stool.  Keep the floor dry. Clean up any water that spills on the floor as soon as it happens.  Remove soap buildup in the tub or shower regularly.  Attach bath mats  securely with double-sided non-slip rug tape.  Do not have throw rugs and other things on the floor that can make you trip. What can I do in the bedroom?  Use night lights.  Make sure that you have a light by your bed that is easy to reach.  Do not use any sheets or blankets that are too big for your bed. They should not hang down onto the floor.  Have a firm chair that has side arms. You can use this for support while you get dressed.  Do not have throw rugs and other things on the floor that can make you trip. What can I do in the kitchen?  Clean up any spills right away.  Avoid walking on wet floors.  Keep items that you use a lot in easy-to-reach places.  If you need to reach something above you, use a strong step stool that has a grab bar.  Keep electrical cords out of the way.  Do not use floor polish or wax that makes floors slippery. If you must use wax, use non-skid floor wax.  Do not have throw rugs and other things on the floor that can make you trip. What can I do  with my stairs?  Do not leave any items on the stairs.  Make sure that there are handrails on both sides of the stairs and use them. Fix handrails that are broken or loose. Make sure that handrails are as long as the stairways.  Check any carpeting to make sure that it is firmly attached to the stairs. Fix any carpet that is loose or worn.  Avoid having throw rugs at the top or bottom of the stairs. If you do have throw rugs, attach them to the floor with carpet tape.  Make sure that you have a light switch at the top of the stairs and the bottom of the stairs. If you do not have them, ask someone to add them for you. What else can I do to help prevent falls?  Wear shoes that:  Do not have high heels.  Have rubber bottoms.  Are comfortable and fit you well.  Are closed at the toe. Do not wear sandals.  If you use a stepladder:  Make sure that it is fully opened. Do not climb a closed  stepladder.  Make sure that both sides of the stepladder are locked into place.  Ask someone to hold it for you, if possible.  Clearly mark and make sure that you can see:  Any grab bars or handrails.  First and last steps.  Where the edge of each step is.  Use tools that help you move around (mobility aids) if they are needed. These include:  Canes.  Walkers.  Scooters.  Crutches.  Turn on the lights when you go into a dark area. Replace any light bulbs as soon as they burn out.  Set up your furniture so you have a clear path. Avoid moving your furniture around.  If any of your floors are uneven, fix them.  If there are any pets around you, be aware of where they are.  Review your medicines with your doctor. Some medicines can make you feel dizzy. This can increase your chance of falling. Ask your doctor what other things that you can do to help prevent falls. This information is not intended to replace advice given to you by your health care provider. Make sure you discuss any questions you have with your health care provider. Document Released: 01/15/2009 Document Revised: 08/27/2015 Document Reviewed: 04/25/2014 Elsevier Interactive Patient Education  2017 [employer].

## 2019-01-08 NOTE — Progress Notes (Signed)
Subjective:   Jacqueline Kaiser is a 73 y.o. female who presents for Medicare Annual (Subsequent) preventive examination.  Review of Systems:   Cardiac Risk Factors include: advanced age (>31men, >15 women);dyslipidemia     Objective:     Vitals: BP 108/70 (BP Location: Left Arm, Patient Position: Sitting, Cuff Size: Normal)   Pulse 61   Temp (!) 96.8 F (36 C) (Temporal)   Resp 16   Ht 5\' 7"  (1.702 m)   SpO2 99%   BMI 27.50 kg/m   Body mass index is 27.5 kg/m.  Advanced Directives 01/08/2019 06/19/2018 06/15/2018 01/04/2018 03/21/2016  Does Patient Have a Medical Advance Directive? Yes Yes Yes Yes Yes  Type of Paramedic of [place];Living will [place];Living will [place];Living will [place];Living will [place];Living will  Does patient want to make changes to medical advance directive? - No - Patient declined No - Patient declined - No - Patient declined  Copy of [place] in Chart? Yes - validated most recent copy scanned in chart (See row information) No - copy requested No - copy requested No - copy requested Yes    Tobacco Social History   Tobacco Use  Smoking Status Former Smoker  . Packs/day: 0.50  . Years: 10.00  . Pack years: 5.00  . Types: Cigarettes  . Quit date: 41  . Years since quitting: 40.7  Smokeless Tobacco Never Used  Tobacco Comment   smoking cessation materials not required     Counseling given: Not Answered Comment: smoking cessation materials not required   Clinical Intake:  Pre-visit preparation completed: Yes  Pain : No/denies pain     Nutritional Risks: None Diabetes: No  How often do you need to have someone help you when you read instructions, pamphlets, or other written materials from your doctor or pharmacy?: 1 - Never  Interpreter Needed?: No  Information entered by :: Clemetine Marker LPN  Past  Medical History:  Diagnosis Date  . Ankle fracture 2014   Left  . Anxiety   . Female cystocele    Grade 2  . Fracture    lower back after sneezing  . GERD (gastroesophageal reflux disease)    History of  . History of vertigo    last attack about 1 week ago  . Hyperlipidemia   . Insomnia   . 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   Right, .4 x 1.3 cm, noted on [place] ABD 05/2016, left lobe of the liver with the largest measuring 9.9 mm in diameter [place] ABD 06/2010  . Low back sprain   . Nonsustained ventricular tachycardia ([place])    by Holter,  noraml Stress test ECHO perpatient [name], [place])  . Obese   . Osteoporosis   . Sinusitis   . Urinary frequency   . Wrist fracture    Left   Past Surgical History:  Procedure Laterality Date  . ABDOMINAL HYSTERECTOMY    . BREAST BIOPSY Right    negative 10-12 years ago- core  . CESAREAN SECTION    . COLONOSCOPY  2012  . CYSTOCELE REPAIR N/A 06/19/2018   Procedure: CYSTOSCOPY ANTERIOR REPAIR (CYSTOCELE);  Surgeon: Bjorn Loser, MD;  Location: [facility];  Service: Urology;  Laterality: N/A;  . TONSILLECTOMY     Family History  Problem Relation Age of Onset  . Heart disease Mother        ATRIAL FIB  . Cancer Father 37  Lung Ca,  nonsmoker, metastatic at diagnosis  asbestos exposure  . Breast cancer Daughter 97       dx at age of 69 and 17   Social History   Socioeconomic History  . Marital status: Married    Spouse name: Octavia Bruckner  . Number of children: 2  . Years of education: Not on file  . Highest education level: 12th grade  Occupational History  . Occupation: Retired  Scientific laboratory technician  . Financial resource strain: Not hard at all  . Food insecurity    Worry: Never true    Inability: Never true  . Transportation needs    Medical: No    Non-medical: No  Tobacco Use  . Smoking status: Former Smoker    Packs/day: 0.50    Years: 10.00    Pack years: 5.00    Types: Cigarettes    Quit date: 1980    Years since quitting: 40.7  . Smokeless  tobacco: Never Used  . Tobacco comment: smoking cessation materials not required  Substance and Sexual Activity  . Alcohol use: Yes    Comment: wine occasionally  . Drug use: No  . Sexual activity: Not Currently    Birth control/protection: Surgical  Lifestyle  . Physical activity    Days per week: 0 days    Minutes per session: 0 min  . Stress: Not at all  Relationships  . Social Herbalist on phone: Patient refused    Gets together: Patient refused    Attends religious service: Patient refused    Active member of club or organization: Patient refused    Attends meetings of clubs or organizations: Patient refused    Relationship status: Married  Other Topics Concern  . Not on file  Social History Narrative  . Not on file    Outpatient Encounter Medications as of 01/08/2019  Medication Sig  . Ascorbic Acid (VITAMIN C) 100 MG tablet Take 100 mg by mouth daily.  . calcium carbonate (OS-CAL) 600 MG TABS tablet Take by mouth.  . cholecalciferol (VITAMIN D3) 25 MCG (1000 UT) tablet Take 1,000 Units by mouth daily.  . meclizine (ANTIVERT) 12.5 MG tablet Take 1 tablet (12.5 mg total) by mouth 4 (four) times daily as needed for dizziness.  Marland Kitchen PROLIA 60 MG/ML SOLN injection Inject 1 each into the skin every 6 (six) months.   . rosuvastatin (CRESTOR) 20 MG tablet Take 1 tablet (20 mg total) by mouth every evening.  . vitamin B-12 (CYANOCOBALAMIN) 500 MCG tablet Take 500 mcg by mouth daily.   No facility-administered encounter medications on file as of 01/08/2019.     Activities of Daily Living In your present state of health, do you have any difficulty performing the following activities: 01/08/2019 12/03/2018  Hearing? N N  Comment declines hearing aids -  Vision? N Y  Difficulty concentrating or making decisions? N N  Walking or climbing stairs? N N  Dressing or bathing? N N  Doing errands, shopping? N N  Preparing Food and eating ? N -  Using the Toilet? N -  In the  past six months, have you accidently leaked urine? N -  Do you have problems with loss of bowel control? N -  Managing your Medications? N -  Managing your Finances? N -  Housekeeping or managing your Housekeeping? N -  Some recent data might be hidden    Patient Care Team: Steele Sizer, MD as PCP - General (Family Medicine) [place],  Betsey Holiday, MD as Physician Assistant (Endocrinology)    Assessment:   This is a routine wellness examination for [facility].  Exercise Activities and Dietary recommendations Current Exercise Habits: The patient does not participate in regular exercise at present, Exercise limited by: None identified  Goals    . DIET - INCREASE WATER INTAKE     Recommend to drink at least 6-8 8oz glasses of water per day.    . Increase physical activity     Water aerobic classes 3 times a week       Fall Risk Fall Risk  01/08/2019 12/03/2018 01/08/2018 01/04/2018 08/30/2017  Falls in the past year? 0 0 Yes No Yes  Number falls in past yr: 0 0 2 or more - 1  Comment - - February 2019 - -  Injury with Fall? 0 0 (No Data) - Yes  Comment - - Hurt her wrist - Right Wrist fell in gym shower  Risk Factor Category  - - - - -  Comment - - - - -  Risk for fall due to : - - History of fall(s) Impaired vision -  Risk for fall due to: Comment - - - wears eyeglasses -  Follow up Falls prevention discussed - Falls prevention discussed - -   FALL RISK PREVENTION PERTAINING TO THE HOME:  Any stairs in or around the home? Yes  If so, do they handrails? Yes   Home free of loose throw rugs in walkways, pet beds, electrical cords, etc? Yes  Adequate lighting in your home to reduce risk of falls? Yes   ASSISTIVE DEVICES UTILIZED TO PREVENT FALLS:  Life alert? No  Use of a cane, walker or w/c? No  Grab bars in the bathroom? Yes Shower chair or bench in shower? No  Elevated toilet seat or a handicapped toilet? No   DME ORDERS:  DME order needed?  No   TIMED UP AND GO:  Was the  test performed? Yes Length of time to ambulate 10 feet: 5 sec.   GAIT:  Appearance of gait: Gait stead-fast and without the use of an assistive device.   Education: Fall risk prevention has been discussed.  Intervention(s) required? No   Depression Screen PHQ 2/9 Scores 01/08/2019 12/03/2018 01/08/2018 01/04/2018  PHQ - 2 Score 0 0 0 0  PHQ- 9 Score 1 1 2  0     Cognitive Function     6CIT Screen 01/08/2019 01/04/2018 03/21/2016  What Year? 0 points 0 points 0 points  What month? 0 points 0 points 0 points  What time? 0 points 0 points 0 points  Count back from 20 0 points 0 points 0 points  Months in reverse 0 points 0 points 0 points  Repeat phrase 4 points 4 points -  Total Score 4 4 -    Immunization History  Administered Date(s) Administered  . Fluad Quad(high Dose 65+) 12/03/2018  . Influenza Split 01/06/2012, 12/15/2012  . Influenza-Unspecified 12/04/2015, 12/18/2016, 12/29/2017  . Pneumococcal Conjugate-13 12/15/2014  . Pneumococcal Polysaccharide-23 12/15/2007, 08/30/2017  . Tdap 10/13/2013, 10/30/2018  . Zoster Recombinat (Shingrix) 10/30/2018    Qualifies for Shingles Vaccine? Yes . Due for Shingrix. 1st dose of Shingrix done. Due for second dose.   Tdap: Up to date  Flu Vaccine: Up to date  Pneumococcal Vaccine: Up to date   Screening Tests Health Maintenance  Topic Date Due  . MAMMOGRAM  01/07/2020  . Fecal DNA (Cologuard)  02/17/2021  . TETANUS/TDAP  10/29/2028  .  INFLUENZA VACCINE  Completed  . DEXA SCAN  Completed  . Hepatitis C Screening  Completed  . PNA vac Low Risk Adult  Completed   Cancer Screenings:  Colorectal Screening: Cologuard completed 02/17/18. Repeat every 3 years  Mammogram: Completed 01/07/19. Repeat every year;    Bone Density: Completed 06/20/17. Results reflect OSTEOPENIA. Repeat every 2 years.   Lung Cancer Screening: (Low Dose CT Chest recommended if Age 27-80 years, 30 pack-year currently smoking OR have quit w/in  15years.) does not qualify.   Additional Screening:  Hepatitis C Screening: does qualify; Completed 12/15/14  Vision Screening: Recommended annual ophthalmology exams for early detection of glaucoma and other disorders of the eye. Is the patient up to date with their annual eye exam?  Yes  Who is the provider or what is the name of the office in which the pt attends annual eye exams? [facility]  Dental Screening: Recommended annual dental exams for proper oral hygiene  Community Resource Referral:  CRR required this visit?  No       Plan:     I have personally reviewed and addressed the Medicare Annual Wellness questionnaire and have noted the following in the patient's chart:  A. Medical and social history B. Use of alcohol, tobacco or illicit drugs  C. Current medications and supplements D. Functional ability and status E.  Nutritional status F.  Physical activity G. Advance directives H. List of other physicians I.  Hospitalizations, surgeries, and ER visits in previous 12 months J.  [place] such as hearing and vision if needed, cognitive and depression L. Referrals and appointments   In addition, I have reviewed and discussed with patient certain preventive protocols, quality metrics, and best practice recommendations. A written personalized care plan for preventive services as well as general preventive health recommendations were provided to patient.   Signed,  Clemetine Marker, LPN Nurse Health Advisor   Nurse Notes: none

## 2019-04-26 ENCOUNTER — Ambulatory Visit: Payer: PPO

## 2019-04-27 ENCOUNTER — Ambulatory Visit: Payer: PPO

## 2019-04-29 ENCOUNTER — Ambulatory Visit: Payer: PPO | Attending: Internal Medicine

## 2019-04-29 DIAGNOSIS — Z23 Encounter for immunization: Secondary | ICD-10-CM | POA: Insufficient documentation

## 2019-04-29 NOTE — Progress Notes (Signed)
   Covid-19 Vaccination Clinic  Name:  Jacqueline Kaiser    MRN: [id number] DOB: Dec 15, 1945  04/29/2019  [name] was observed post Covid-19 immunization for 15 minutes without incidence. She was provided with Vaccine Information Sheet and instruction to access the V-Safe system.   [name] was instructed to call 911 with any severe reactions post vaccine: Marland Kitchen Difficulty breathing  . Swelling of your face and throat  . A fast heartbeat  . A bad rash all over your body  . Dizziness and weakness    Immunizations Administered    Name Date Dose VIS Date Route   Pfizer COVID-19 Vaccine 04/29/2019  8:59 AM 0.3 mL 03/15/2019 Intramuscular   Manufacturer: [place]   Lot: [id number]   [place]: [id number]

## 2019-05-06 ENCOUNTER — Other Ambulatory Visit: Payer: Self-pay

## 2019-05-06 ENCOUNTER — Encounter: Payer: Self-pay | Admitting: Urology

## 2019-05-06 ENCOUNTER — Ambulatory Visit: Payer: PPO | Admitting: Urology

## 2019-05-06 VITALS — BP 144/76 | HR 86 | Ht 67.0 in | Wt 175.0 lb

## 2019-05-06 DIAGNOSIS — N3946 Mixed incontinence: Secondary | ICD-10-CM

## 2019-05-06 DIAGNOSIS — R350 Frequency of micturition: Secondary | ICD-10-CM

## 2019-05-06 LAB — URINALYSIS, COMPLETE
Bilirubin, UA: NEGATIVE
Glucose, UA: NEGATIVE
Ketones, UA: NEGATIVE
Leukocytes,UA: NEGATIVE
Nitrite, UA: NEGATIVE
Protein,UA: NEGATIVE
RBC, UA: NEGATIVE
Specific Gravity, UA: <1.005 — ABNORMAL LOW (ref 1.005–1.030)
Urobilinogen, Ur: 0.2 mg/dL (ref 0.2–1.0)
pH, UA: 5 (ref 5.0–7.5)

## 2019-05-06 LAB — MICROSCOPIC EXAMINATION
Bacteria, UA: NONE SEEN
RBC, Urine: NONE SEEN /hpf (ref 0–2)

## 2019-05-06 MED ORDER — OXYBUTYNIN CHLORIDE ER 10 MG PO TB24: 10.0000 mg | ORAL_TABLET | Freq: Every day | ORAL | 11 refills | Status: DC

## 2019-05-06 MED ORDER — MIRABEGRON ER 50 MG PO TB24: 50.0000 mg | ORAL_TABLET | Freq: Every day | ORAL | 11 refills | Status: DC

## 2019-05-06 NOTE — Progress Notes (Signed)
05/06/2019 9:57 AM   Jacqueline Kaiser 29-Mar-1946 [id number]  Referring provider: Steele Sizer, MD [address] [place],  [place] [address]  Chief Complaint  Patient presents with  . Urinary Frequency    HPI: Patient has cystocele repair on June 19, 2018.  She saw our nurse practitioner in May and she was pleased with the repair.  She was having some nocturia and was no longer on Myrbetriq and she did not feel she still needed it.  She was no longer using vaginal estrogen cream and did not feel she needed either medication.  Bladder little bit urgent but otherwise doing well.  No pain.  No incontinence.  Frequency stable  On vaginal examination she had very good support anteriorly posteriorly with good vaginal length and no stress incontinence.  She had some poor estrogenization changes near the urethra.  Today The patient is complaining of urge incontinence and foot on the floor syndrome wearing 1 or 2 damp many pads per day.  She gets up once a night.  She can leak some with coughing sneezing and is seeing her primary care doctor tomorrow for constipation.  She is time voiding trying to stay drier.  She has a lot of urgency especially no the night  When I saw her prior to her surgery she was voiding every 1-2 hours and Myrbetriq was helping some of her urgency.  She still has low back discomfort that is nonspecific.  We had separated urgency goals first prolapse goals at that time.  Her bladder capacity was 320 mL.  She did not have stress incontinence reaching a pressure of 126 cm water.  She had bladder overactivity reaching a pressure of 13 cm of water leaking a mild to moderate amount  Symptoms definitely worse at night in the last month especially  Clinically not infected though urine strong smelling and sent for culture       PMH: Past Medical History:  Diagnosis Date  . Ankle fracture 2014   Left  . Anxiety   . Female cystocele    Grade 2  .  Fracture    lower back after sneezing  . GERD (gastroesophageal reflux disease)    History of  . History of vertigo    last attack about 1 week ago  . Hyperlipidemia   . Insomnia   . 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   Right, .4 x 1.3 cm, noted on [place] ABD 05/2016, left lobe of the liver with the largest measuring 9.9 mm in diameter [place] ABD 06/2010  . Low back sprain   . Nonsustained ventricular tachycardia ([place])    by Holter,  noraml Stress test ECHO perpatient [name], [place])  . Obese   . Osteoporosis   . Sinusitis   . Urinary frequency   . Wrist fracture    Left    Surgical History: Past Surgical History:  Procedure Laterality Date  . ABDOMINAL HYSTERECTOMY    . BREAST BIOPSY Right    negative 10-12 years ago- core  . CESAREAN SECTION    . COLONOSCOPY  2012  . CYSTOCELE REPAIR N/A 06/19/2018   Procedure: CYSTOSCOPY ANTERIOR REPAIR (CYSTOCELE);  Surgeon: Bjorn Loser, MD;  Location: [facility];  Service: Urology;  Laterality: N/A;  . TONSILLECTOMY      Home Medications:  Allergies as of 05/06/2019      Reactions   Betadine [povidone Iodine] Other (See Comments)   Burns   Iodine       Medication List  Accurate as of May 06, 2019  9:57 AM. If you have any questions, ask your nurse or doctor.        calcium carbonate 600 MG Tabs tablet Commonly known as: OS-CAL Take by mouth.   cholecalciferol 25 MCG (1000 UNIT) tablet Commonly known as: VITAMIN D3 Take 1,000 Units by mouth daily.   meclizine 12.5 MG tablet Commonly known as: ANTIVERT Take 1 tablet (12.5 mg total) by mouth 4 (four) times daily as needed for dizziness.   Prolia 60 MG/ML Soln injection Generic drug: denosumab Inject 1 each into the skin every 6 (six) months.   rosuvastatin 20 MG tablet Commonly known as: CRESTOR Take 1 tablet (20 mg total) by mouth every evening.   vitamin B-12 500 MCG tablet Commonly known as: CYANOCOBALAMIN Take 500 mcg by mouth daily.   vitamin C 100 MG tablet Take 100 mg  by mouth daily.       Allergies:  Allergies  Allergen Reactions  . Betadine [Povidone Iodine] Other (See Comments)    Burns  . Iodine     Family History: Family History  Problem Relation Age of Onset  . Heart disease Mother        ATRIAL FIB  . Cancer Father 58       Lung Ca,  nonsmoker, metastatic at diagnosis  asbestos exposure  . Breast cancer Daughter 60       dx at age of 44 and 66    Social History:  reports that she quit smoking about 41 years ago. Her smoking use included cigarettes. She has a 5.00 pack-year smoking history. She has never used smokeless tobacco. She reports current alcohol use. She reports that she does not use drugs.  ROS: UROLOGY Frequent Urination?: Yes Hard to postpone urination?: Yes Burning/pain with urination?: No Get up at night to urinate?: Yes Leakage of urine?: No Urine stream starts and stops?: Yes Trouble starting stream?: No Do you have to strain to urinate?: No Blood in urine?: No Urinary tract infection?: No Sexually transmitted disease?: No Injury to kidneys or bladder?: No Painful intercourse?: No Weak stream?: No Currently pregnant?: No Vaginal bleeding?: No Last menstrual period?: N  Gastrointestinal Nausea?: No Vomiting?: No Indigestion/heartburn?: No Diarrhea?: No Constipation?: Yes  Constitutional Fever: No Night sweats?: No Weight loss?: No Fatigue?: No  Skin Skin rash/lesions?: No Itching?: No  Eyes Blurred vision?: No Double vision?: No  Ears/Nose/Throat Sore throat?: No Sinus problems?: No  Hematologic/Lymphatic Swollen glands?: No Easy bruising?: No  Cardiovascular Leg swelling?: No Chest pain?: No  Respiratory Cough?: No Shortness of breath?: No  Endocrine Excessive thirst?: No  Musculoskeletal Back pain?: No Joint pain?: No  Neurological Headaches?: No Dizziness?: No  Psychologic Depression?: No Anxiety?: No  Physical Exam: BP (!) 144/76   Pulse 86   Ht 5\' 7"   (1.702 m)   Wt 175 lb (79.4 kg)   BMI 27.41 kg/m   Constitutional:  Alert and oriented, No acute distress.  Laboratory Data: Lab Results  Component Value Date   WBC 4.5 06/15/2018   HGB 12.3 06/20/2018   HCT 37.9 06/20/2018   MCV 97.6 06/15/2018   PLT 198 06/15/2018    Lab Results  Component Value Date   CREATININE 1.02 (H) 12/21/2018    No results found for: PSA  No results found for: TESTOSTERONE  No results found for: HGBA1C  Urinalysis    Component Value Date/Time   COLORURINE YELLOW 03/22/2016 0957   APPEARANCEUR Clear 04/24/2018 1022  LABSPEC 1.010 03/22/2016 0957   PHURINE 5.0 03/22/2016 0957   GLUCOSEU Negative 04/24/2018 1022   GLUCOSEU NEGATIVE 03/22/2016 0957   HGBUR NEGATIVE 03/22/2016 0957   BILIRUBINUR Negative 04/24/2018 1022   KETONESUR NEGATIVE 03/22/2016 0957   PROTEINUR Negative 04/24/2018 1022   UROBILINOGEN 0.2 03/22/2016 0957   NITRITE Negative 04/24/2018 1022   NITRITE NEGATIVE 03/22/2016 0957   LEUKOCYTESUR Trace (A) 04/24/2018 1022    Pertinent Imaging:   Assessment & Plan: Patient is gradually having more urgency and even urge incontinence.  I will call if culture is positive.  I gave her Myrbetriq 50 mg samples and prescription.  I gave her oxybutynin ER 10 mg recognizing it could worsen her constipation.  We had a little bit of a circular conversation but there is no question she had this prior.  Probably also has an element of a nocturnal diuresis  1. Urinary frequency  - Urinalysis, Complete   No follow-ups on file.  [name], MD  [facility] [address] [place], [place] [address] [phone]

## 2019-05-06 NOTE — Addendum Note (Signed)
Addended by: Verlene Mayer A on: 05/06/2019 12:16 PM   Modules accepted: Orders

## 2019-05-06 NOTE — Addendum Note (Signed)
Addended by: Verlene Mayer A on: 05/06/2019 10:23 AM   Modules accepted: Orders

## 2019-05-07 ENCOUNTER — Encounter: Payer: Self-pay | Admitting: Family Medicine

## 2019-05-07 ENCOUNTER — Ambulatory Visit
Admission: RE | Admit: 2019-05-07 | Discharge: 2019-05-07 | Disposition: A | Discharge status: Home / Self Care | Payer: PPO | Attending: Family Medicine | Admitting: Family Medicine

## 2019-05-07 ENCOUNTER — Ambulatory Visit
Admission: RE | Admit: 2019-05-07 | Discharge: 2019-05-07 | Disposition: A | Discharge status: Home / Self Care | Payer: PPO | Source: Ambulatory Visit | Attending: Family Medicine | Admitting: Family Medicine

## 2019-05-07 ENCOUNTER — Ambulatory Visit (INDEPENDENT_AMBULATORY_CARE_PROVIDER_SITE_OTHER): Payer: PPO | Admitting: Family Medicine

## 2019-05-07 VITALS — BP 116/78 | HR 85 | Temp 97.9°F | Resp 14 | Ht 67.0 in | Wt 185.9 lb

## 2019-05-07 DIAGNOSIS — K921 Melena: Secondary | ICD-10-CM

## 2019-05-07 DIAGNOSIS — K30 Functional dyspepsia: Secondary | ICD-10-CM | POA: Diagnosis not present

## 2019-05-07 DIAGNOSIS — R1084 Generalized abdominal pain: Secondary | ICD-10-CM

## 2019-05-07 DIAGNOSIS — L659 Nonscarring hair loss, unspecified: Secondary | ICD-10-CM

## 2019-05-07 DIAGNOSIS — K5900 Constipation, unspecified: Secondary | ICD-10-CM | POA: Diagnosis not present

## 2019-05-07 NOTE — Patient Instructions (Addendum)
Continue the metamucil or increased dietary fiber (20-30 g/day)  Keep pushing clear fluids  Take once daily colace or miralax   Please return stool cards  Please follow up if your symptoms do not improve  We will call you with your lab results    Constipation, Adult Constipation is when a person:  Poops (has a bowel movement) fewer times in a week than normal.  Has a hard time pooping.  Has poop that is dry, hard, or bigger than normal. Follow these instructions at home: Eating and drinking   Eat foods that have a lot of fiber, such as: ? Fresh fruits and vegetables. ? Whole grains. ? Beans.  Eat less of foods that are high in fat, low in fiber, or overly processed, such as: ? [place] fries. ? Hamburgers. ? Cookies. ? Candy. ? Soda.  Drink enough fluid to keep your pee (urine) clear or pale yellow. General instructions  Exercise regularly or as told by your doctor.  Go to the restroom when you feel like you need to poop. Do not hold it in.  Take over-the-counter and prescription medicines only as told by your doctor. These include any fiber supplements.  Do pelvic floor retraining exercises, such as: ? Doing deep breathing while relaxing your lower belly (abdomen). ? Relaxing your pelvic floor while pooping.  Watch your condition for any changes.  Keep all follow-up visits as told by your doctor. This is important. Contact a doctor if:  You have pain that gets worse.  You have a fever.  You have not pooped for 4 days.  You throw up (vomit).  You are not hungry.  You lose weight.  You are bleeding from the anus.  You have thin, pencil-like poop (stool). Get help right away if:  You have a fever, and your symptoms suddenly get worse.  You leak poop or have blood in your poop.  Your belly feels hard or bigger than normal (is bloated).  You have very bad belly pain.  You feel dizzy or you faint. This information is not intended to replace  advice given to you by your health care provider. Make sure you discuss any questions you have with your health care provider. Document Revised: 03/03/2017 Document Reviewed: 09/09/2015 Elsevier Patient Education  2020 [employer].

## 2019-05-07 NOTE — Progress Notes (Signed)
Patient ID: Jacqueline Kaiser, female    DOB: 1945-11-21, 74 y.o.   MRN: [id number]  PCP: Steele Sizer, MD  Chief Complaint  Patient presents with  . Constipation    with other symptoms such as hair loss, indegestion, brittle nails    Subjective:   Jacqueline Kaiser is a 74 y.o. female, presents to clinic with CC of the following:  HPI   Nails suddenly became brittle about 3-5 months ago, were really strong and good in Sept then cracked, breaking, thin Hair falling out more than ever before Constipation gradual onset and worsening x 1 month metamucil and sennacot pushing fluids and indigestion with foul gas, making her urinary sx better Over the past week she pushed constipation meds and supplements and was able to have more frequent BMs   Some reflux, indigestion taking omeprazole     Patient Active Problem List   Diagnosis Date Noted  . Cystocele with prolapse 06/19/2018  . Age-related osteoporosis without current pathological fracture 01/08/2018  . History of vertebral compression fracture 05/23/2017  . Incomplete bladder emptying 05/16/2016  . GERD (gastroesophageal reflux disease) 02/14/2016  . Insomnia due to anxiety and fear 02/14/2016  . Allergy 07/01/2013  . Osteoporosis of lumbar spine 05/19/2013  . Overweight (BMI 25.0-29.9) 08/28/2011  . Peripheral vertigo 08/28/2011  . Nonsustained ventricular tachycardia ([place])   . Hyperlipidemia       Current Outpatient Medications:  .  Ascorbic Acid (VITAMIN C) 100 MG tablet, Take 100 mg by mouth daily., Disp: , Rfl:  .  calcium carbonate (OS-CAL) 600 MG TABS tablet, Take by mouth., Disp: , Rfl:  .  cholecalciferol (VITAMIN D3) 25 MCG (1000 UT) tablet, Take 1,000 Units by mouth daily., Disp: , Rfl:  .  meclizine (ANTIVERT) 12.5 MG tablet, Take 1 tablet (12.5 mg total) by mouth 4 (four) times daily as needed for dizziness., Disp: 30 tablet, Rfl: 0 .  mirabegron ER (MYRBETRIQ) 50 MG TB24 tablet, Take 1 tablet (50 mg  total) by mouth daily., Disp: 30 tablet, Rfl: 11 .  oxybutynin (DITROPAN-XL) 10 MG 24 hr tablet, Take 1 tablet (10 mg total) by mouth daily., Disp: 30 tablet, Rfl: 11 .  PROLIA 60 MG/ML SOLN injection, Inject 1 each into the skin every 6 (six) months. , Disp: , Rfl:  .  rosuvastatin (CRESTOR) 20 MG tablet, Take 1 tablet (20 mg total) by mouth every evening., Disp: 90 tablet, Rfl: 1 .  vitamin B-12 (CYANOCOBALAMIN) 500 MCG tablet, Take 500 mcg by mouth daily., Disp: , Rfl:    Allergies  Allergen Reactions  . Betadine [Povidone Iodine] Other (See Comments)    Burns  . Iodine      Family History  Problem Relation Age of Onset  . Heart disease Mother        ATRIAL FIB  . Cancer Father 53       Lung Ca,  nonsmoker, metastatic at diagnosis  asbestos exposure  . Breast cancer Daughter 27       dx at age of 31 and 44   Social History   Tobacco Use  . Smoking status: Former Smoker    Packs/day: 0.50    Years: 10.00    Pack years: 5.00    Types: Cigarettes    Quit date: 1980    Years since quitting: 41.1  . Smokeless tobacco: Never Used  . Tobacco comment: smoking cessation materials not required  Substance Use Topics  . Alcohol use: Yes  Comment: wine occasionally      Chart Review Today: I personally reviewed active problem list, medication list, allergies, family history, social history, health maintenance, notes from last encounter, lab results, imaging with the patient/caregiver today.   Review of Systems  Constitutional: Negative.   HENT: Negative.   Eyes: Negative.   Respiratory: Negative.   Cardiovascular: Negative.   Gastrointestinal: Negative.   Endocrine: Negative.   Genitourinary: Negative.   Musculoskeletal: Negative.   Skin: Negative.   Allergic/Immunologic: Negative.   Neurological: Negative.   Hematological: Negative.   Psychiatric/Behavioral: Negative.   All other systems reviewed and are negative.      Objective:   Vitals:   05/07/19 1304   BP: 116/78  Pulse: 85  Resp: 14  Temp: 97.9 F (36.6 C)  SpO2: 98%  Weight: 185 lb 14.4 oz (84.3 kg)  Height: 5\' 7"  (1.702 m)    Body mass index is 29.12 kg/m.  Physical Exam Vitals and nursing note reviewed.  Constitutional:      General: She is not in acute distress.    Appearance: Normal appearance. She is well-developed. She is not ill-appearing, toxic-appearing or diaphoretic.     Interventions: Face mask in place.  HENT:     Head: Normocephalic and atraumatic.     Right Ear: External ear normal.     Left Ear: External ear normal.     Mouth/Throat:     Mouth: Mucous membranes are dry.  Eyes:     General: Lids are normal. No scleral icterus.       Right eye: No discharge.        Left eye: No discharge.     Conjunctiva/sclera: Conjunctivae normal.  Neck:     Trachea: Phonation normal. No tracheal deviation.  Cardiovascular:     Rate and Rhythm: Normal rate and regular rhythm.     Pulses: Normal pulses.          Radial pulses are 2+ on the right side and 2+ on the left side.       Posterior tibial pulses are 2+ on the right side and 2+ on the left side.     Heart sounds: Normal heart sounds. No murmur. No friction rub. No gallop.   Pulmonary:     Effort: Pulmonary effort is normal. No respiratory distress.     Breath sounds: Normal breath sounds. No stridor. No wheezing, rhonchi or rales.  Chest:     Chest wall: No tenderness.  Abdominal:     General: Bowel sounds are normal. There is no distension.     Palpations: Abdomen is soft. There is no hepatomegaly, splenomegaly, mass or pulsatile mass.     Tenderness: There is no abdominal tenderness. There is no right CVA tenderness, left CVA tenderness, guarding or rebound.  Musculoskeletal:        General: No deformity. Normal range of motion.     Cervical back: Normal range of motion and neck supple.     Right lower leg: No edema.     Left lower leg: No edema.  Lymphadenopathy:     Cervical: No cervical adenopathy.   Skin:    General: Skin is warm and dry.     Capillary Refill: Capillary refill takes less than 2 seconds.     Coloration: Skin is not jaundiced or pale.     Findings: No rash.  Neurological:     Mental Status: She is alert and oriented to person, place, and time.  Motor: No abnormal muscle tone.     Gait: Gait normal.  Psychiatric:        Mood and Affect: Mood normal.        Speech: Speech normal.        Behavior: Behavior normal.      Results for orders placed or performed in visit on 05/06/19  Microscopic Examination   URINE  Result Value Ref Range   WBC, UA 0-5 0 - 5 /hpf   RBC None seen 0 - 2 /hpf   Epithelial Cells (non renal) 0-10 0 - 10 /hpf   Casts Present (A) None seen /lpf   Cast Type Hyaline casts N/A   Bacteria, UA None seen None seen/Few  Urinalysis, Complete  Result Value Ref Range   Specific Gravity, UA <1.005 (L) 1.005 - 1.030   pH, UA 5.0 5.0 - 7.5   Color, UA Yellow Yellow   Appearance Ur Clear Clear   Leukocytes,UA Negative Negative   Protein,UA Negative Negative/Trace   Glucose, UA Negative Negative   Ketones, UA Negative Negative   RBC, UA Negative Negative   Bilirubin, UA Negative Negative   Urobilinogen, Ur 0.2 0.2 - 1.0 mg/dL   Nitrite, UA Negative Negative   Microscopic Examination See below:         Assessment & Plan:   1. Constipation, unspecified constipation type Constipation for several months, stool harder, compact, darker and foul odor leading to some abd discomfort and worsened urinary sx. Improving with pushing fluids and OTC meds, encouraged her to avoid stimulant laxative, push fluids, supplement or increase dietary fiber, colace or miralax   KUB to assess stool burden - CBC with Differential/Platelet - COMPLETE METABOLIC PANEL WITH GFR - TSH - DG Abd 1 View  2. Hair loss - TSH  3. Indigestion OTC pepcid/zantac or prilosec, no epigastric ttp,no N, V, likely worse GERD secondary to constipation  4. Melena Darker  compact stool - stool cards and CBC  - CBC with Differential/Platelet  5. Generalized abdominal pain Bloated, gassy uncomfortable, non-focal, benign abd exam  - CBC with Differential/Platelet - COMPLETE METABOLIC PANEL WITH GFR - TSH    labs and xray as noted above, conservative diet and OTC treatment for constipation, f/up as needed   Delsa Grana, PA-C 05/07/19 1:11 PM

## 2019-05-08 LAB — CBC WITH DIFFERENTIAL/PLATELET
Absolute Monocytes: 377 cells/uL (ref 200–950)
Basophils Absolute: 10 cells/uL (ref 0–200)
Basophils Relative: 0.2 %
Eosinophils Absolute: 59 cells/uL (ref 15–500)
Eosinophils Relative: 1.2 %
HCT: 41.4 % (ref 35.0–45.0)
Hemoglobin: 14.1 g/dL (ref 11.7–15.5)
Lymphs Abs: 1098 cells/uL (ref 850–3900)
MCH: 31.5 pg (ref 27.0–33.0)
MCHC: 34.1 g/dL (ref 32.0–36.0)
MCV: 92.6 fL (ref 80.0–100.0)
MPV: 9.5 fL (ref 7.5–12.5)
Monocytes Relative: 7.7 %
Neutro Abs: 3357 cells/uL (ref 1500–7800)
Neutrophils Relative %: 68.5 %
Platelets: 209 10*3/uL (ref 140–400)
RBC: 4.47 10*6/uL (ref 3.80–5.10)
RDW: 11.7 % (ref 11.0–15.0)
Total Lymphocyte: 22.4 %
WBC: 4.9 10*3/uL (ref 3.8–10.8)

## 2019-05-08 LAB — COMPLETE METABOLIC PANEL WITH GFR
AG Ratio: 2 (calc) (ref 1.0–2.5)
ALT: 11 U/L (ref 6–29)
AST: 19 U/L (ref 10–35)
Albumin: 4.5 g/dL (ref 3.6–5.1)
Alkaline phosphatase (APISO): 50 U/L (ref 37–153)
BUN: 15 mg/dL (ref 7–25)
CO2: 28 mmol/L (ref 20–32)
Calcium: 9.4 mg/dL (ref 8.6–10.4)
Chloride: 103 mmol/L (ref 98–110)
Creat: 0.85 mg/dL (ref 0.60–0.93)
GFR, Est African American: 78 mL/min/{1.73_m2} (ref >=60)
GFR, Est Non African American: 67 mL/min/{1.73_m2} (ref >=60)
Globulin: 2.2 g/dL (calc) (ref 1.9–3.7)
Glucose, Bld: 112 mg/dL — ABNORMAL HIGH (ref 65–99)
Potassium: 4.4 mmol/L (ref 3.5–5.3)
Sodium: 141 mmol/L (ref 135–146)
Total Bilirubin: 0.4 mg/dL (ref 0.2–1.2)
Total Protein: 6.7 g/dL (ref 6.1–8.1)

## 2019-05-08 LAB — TSH: TSH: 1.45 mIU/L (ref 0.40–4.50)

## 2019-05-08 LAB — CULTURE, URINE COMPREHENSIVE

## 2019-05-20 ENCOUNTER — Ambulatory Visit: Payer: PPO | Attending: Internal Medicine

## 2019-05-20 DIAGNOSIS — Z23 Encounter for immunization: Secondary | ICD-10-CM

## 2019-05-20 NOTE — Progress Notes (Signed)
   Covid-19 Vaccination Clinic  Name:  Jacqueline Kaiser    MRN: [id number] DOB: 1946-02-01  05/20/2019  [name] was observed post Covid-19 immunization for 15 minutes without incidence. She was provided with Vaccine Information Sheet and instruction to access the V-Safe system.   [name] was instructed to call 911 with any severe reactions post vaccine: Marland Kitchen Difficulty breathing  . Swelling of your face and throat  . A fast heartbeat  . A bad rash all over your body  . Dizziness and weakness    Immunizations Administered    Name Date Dose VIS Date Route   Pfizer COVID-19 Vaccine 05/20/2019  9:05 AM 0.3 mL 03/15/2019 Intramuscular   Manufacturer: [place]   Lot: [id number]   [place]: [id number]

## 2019-05-28 DIAGNOSIS — Z8781 Personal history of (healed) traumatic fracture: Secondary | ICD-10-CM | POA: Diagnosis not present

## 2019-05-28 DIAGNOSIS — M810 Age-related osteoporosis without current pathological fracture: Secondary | ICD-10-CM | POA: Diagnosis not present

## 2019-06-03 ENCOUNTER — Ambulatory Visit (INDEPENDENT_AMBULATORY_CARE_PROVIDER_SITE_OTHER): Payer: PPO | Admitting: Family Medicine

## 2019-06-03 ENCOUNTER — Other Ambulatory Visit: Payer: Self-pay

## 2019-06-03 ENCOUNTER — Encounter: Payer: Self-pay | Admitting: Family Medicine

## 2019-06-03 VITALS — BP 134/76 | HR 75 | Temp 96.6°F | Resp 16 | Ht 67.0 in | Wt 187.1 lb

## 2019-06-03 DIAGNOSIS — N393 Stress incontinence (female) (male): Secondary | ICD-10-CM | POA: Diagnosis not present

## 2019-06-03 DIAGNOSIS — F5101 Primary insomnia: Secondary | ICD-10-CM

## 2019-06-03 DIAGNOSIS — M545 Low back pain, unspecified: Secondary | ICD-10-CM

## 2019-06-03 DIAGNOSIS — K5909 Other constipation: Secondary | ICD-10-CM | POA: Diagnosis not present

## 2019-06-03 DIAGNOSIS — H6123 Impacted cerumen, bilateral: Secondary | ICD-10-CM

## 2019-06-03 DIAGNOSIS — E7800 Pure hypercholesterolemia, unspecified: Secondary | ICD-10-CM

## 2019-06-03 DIAGNOSIS — M810 Age-related osteoporosis without current pathological fracture: Secondary | ICD-10-CM | POA: Diagnosis not present

## 2019-06-03 DIAGNOSIS — K219 Gastro-esophageal reflux disease without esophagitis: Secondary | ICD-10-CM | POA: Diagnosis not present

## 2019-06-03 MED ORDER — ROSUVASTATIN CALCIUM 20 MG PO TABS: 20.0000 mg | ORAL_TABLET | Freq: Every evening | ORAL | 1 refills | Status: DC

## 2019-06-03 NOTE — Progress Notes (Signed)
Name: Jacqueline Kaiser   MRN: [id number]    DOB: January 11, 1946   Date:06/03/2019       Progress Note  Subjective  Chief Complaint  Chief Complaint  Patient presents with  . Hyperlipidemia  . Insomnia  . Ear Fullness    HPI  Osteoporosis history of vertebral compression fracture: she went to [facility], vitamin D and basic metabolic panel was normal. Sheis still getting Prolia with Dr. Gabriel Carina and denies side effects of medication.  Last dose was 05/2019. She was previously on Fosamax for over 15 years and stopped 2018   Chronic lumbar spine pain: she states it has been going on for years, however fall of 2018 pain was severe and did not improve with chiropractor care, she was sent to [facility] and found to have radiculitis..She is doing well without medication at this time. She states pain is usually in the morning and after she moves around it improves on its own. Aching pain on right side   GERD:she is taking Omeprazole twice a week, she has noticed worsening of symptoms lately, symptoms have been worse lately, advised to go up to taking it daily again and back down again .   Hyperlipidemia: taking Crestor, denies myalgia, chest pain or palpitation. Last LDL was 114, she wants to continue current dose and resume physical activity   Insomnia: she states she falls asleep without problems, but tends to wake up in the middle of the night to void and moves to the living room where she turns on the TV and falls back asleep within 10-20 minutes. She states she was taking Trazodone but did not work for her, she goes back to watch TV and falls asleep  She states she is okay, she does not need medication  Constipation: seen by Delsa Grana last month with complaints of constipation, hair loss . Labs reviewed with patient today, normal TSH, KUB mild retained fecal material She takes Miralax intermittently, advised to try taking it daily and also to resume physical activity   Urinary  incontinence: she was doing well, but recently went to see Urologist and Myrbetriq was added, she stopped Ditropan because she has noticed increase in constipation. She also feels like she is unable to completely empty her urine. She states she was doing well after cystocele repair but over the past couple of months symptoms are getting worse again  Ear fullness: on right side, no pain   Patient Active Problem List   Diagnosis Date Noted  . Cystocele with prolapse 06/19/2018  . Age-related osteoporosis without current pathological fracture 01/08/2018  . History of vertebral compression fracture 05/23/2017  . Incomplete bladder emptying 05/16/2016  . GERD (gastroesophageal reflux disease) 02/14/2016  . Insomnia due to anxiety and fear 02/14/2016  . Allergy 07/01/2013  . Osteoporosis of lumbar spine 05/19/2013  . Overweight (BMI 25.0-29.9) 08/28/2011  . Peripheral vertigo 08/28/2011  . Nonsustained ventricular tachycardia ([place])   . Hyperlipidemia     Past Surgical History:  Procedure Laterality Date  . ABDOMINAL HYSTERECTOMY    . BREAST BIOPSY Right    negative 10-12 years ago- core  . CESAREAN SECTION    . COLONOSCOPY  2012  . CYSTOCELE REPAIR N/A 06/19/2018   Procedure: CYSTOSCOPY ANTERIOR REPAIR (CYSTOCELE);  Surgeon: Bjorn Loser, MD;  Location: [facility];  Service: Urology;  Laterality: N/A;  . TONSILLECTOMY      Family History  Problem Relation Age of Onset  . Heart disease Mother  ATRIAL FIB  . Cancer Father 51       Lung Ca,  nonsmoker, metastatic at diagnosis  asbestos exposure  . Breast cancer Daughter 4       dx at age of 62 and 13    Social History   Tobacco Use  . Smoking status: Former Smoker    Packs/day: 0.50    Years: 10.00    Pack years: 5.00    Types: Cigarettes    Quit date: 1980    Years since quitting: 41.1  . Smokeless tobacco: Never Used  . Tobacco comment: smoking cessation materials not required  Substance Use Topics  . Alcohol  use: Yes    Comment: wine occasionally     Current Outpatient Medications:  .  Ascorbic Acid (VITAMIN C) 100 MG tablet, Take 100 mg by mouth daily., Disp: , Rfl:  .  calcium carbonate (OS-CAL) 600 MG TABS tablet, Take by mouth., Disp: , Rfl:  .  cholecalciferol (VITAMIN D3) 25 MCG (1000 UT) tablet, Take 1,000 Units by mouth daily., Disp: , Rfl:  .  meclizine (ANTIVERT) 12.5 MG tablet, Take 1 tablet (12.5 mg total) by mouth 4 (four) times daily as needed for dizziness., Disp: 30 tablet, Rfl: 0 .  mirabegron ER (MYRBETRIQ) 50 MG TB24 tablet, Take 1 tablet (50 mg total) by mouth daily., Disp: 30 tablet, Rfl: 11 .  PROLIA 60 MG/ML SOLN injection, Inject 1 each into the skin every 6 (six) months. , Disp: , Rfl:  .  rosuvastatin (CRESTOR) 20 MG tablet, Take 1 tablet (20 mg total) by mouth every evening., Disp: 90 tablet, Rfl: 1 .  vitamin B-12 (CYANOCOBALAMIN) 500 MCG tablet, Take 500 mcg by mouth daily., Disp: , Rfl:  .  oxybutynin (DITROPAN-XL) 10 MG 24 hr tablet, Take 1 tablet (10 mg total) by mouth daily. (Patient not taking: Reported on 06/03/2019), Disp: 30 tablet, Rfl: 11  Allergies  Allergen Reactions  . Betadine [Povidone Iodine] Other (See Comments)    Burns  . Iodine     I personally reviewed active problem list, medication list, allergies, family history, social history, health maintenance with the patient/caregiver today.   ROS  Constitutional: Negative for fever or weight change.  Respiratory: Negative for cough and shortness of breath.   Cardiovascular: Negative for chest pain or palpitations.  Gastrointestinal: Negative for abdominal pain, mild increase in constipation  Musculoskeletal: Negative for gait problem or joint swelling.  Skin: Negative for rash.  Neurological: Negative for dizziness or headache.  No other specific complaints in a complete review of systems (except as listed in HPI above).  Objective  Vitals:   06/03/19 1038  BP: 134/76  Pulse: 75  Resp:  16  Temp: (!) 96.6 F (35.9 C)  TempSrc: Temporal  SpO2: 100%  Weight: 187 lb 1.6 oz (84.9 kg)  Height: 5\' 7"  (1.702 m)    Body mass index is 29.3 kg/m.  Physical Exam  Constitutional: Patient appears well-developed and well-nourished. Overweight.  No distress.  HEENT: head atraumatic, normocephalic, pupils equal and reactive to light, wax on both ear cannals  Cardiovascular: Normal rate, regular rhythm and normal heart sounds.  No murmur heard. No BLE edema. Pulmonary/Chest: Effort normal and breath sounds normal. No respiratory distress. Abdominal: Soft.  There is no tenderness. Psychiatric: Patient has a normal mood and affect. behavior is normal. Judgment and thought content normal.  Recent Results (from the past 2160 hour(s))  Urinalysis, Complete     Status: Abnormal  Collection Time: 05/06/19 10:11 AM  Result Value Ref Range   Specific Gravity, UA <1.005 (L) 1.005 - 1.030   pH, UA 5.0 5.0 - 7.5   Color, UA Yellow Yellow   Appearance Ur Clear Clear   Leukocytes,UA Negative Negative   Protein,UA Negative Negative/Trace   Glucose, UA Negative Negative   Ketones, UA Negative Negative   RBC, UA Negative Negative   Bilirubin, UA Negative Negative   Urobilinogen, Ur 0.2 0.2 - 1.0 mg/dL   Nitrite, UA Negative Negative   Microscopic Examination See below:   Microscopic Examination     Status: Abnormal   Collection Time: 05/06/19 10:11 AM   URINE  Result Value Ref Range   WBC, UA 0-5 0 - 5 /hpf   RBC None seen 0 - 2 /hpf   Epithelial Cells (non renal) 0-10 0 - 10 /hpf   Casts Present (A) None seen /lpf   Cast Type Hyaline casts N/A   Bacteria, UA None seen None seen/Few  CULTURE, URINE COMPREHENSIVE     Status: None   Collection Time: 05/06/19  1:06 PM   Specimen: Urine   UR  Result Value Ref Range   Urine Culture, Comprehensive Final report    Organism ID, Bacteria Comment     Comment: No growth in 36 - 48 hours.  CBC with Differential/Platelet     Status: None    Collection Time: 05/07/19  1:49 PM  Result Value Ref Range   WBC 4.9 3.8 - 10.8 Thousand/uL   RBC 4.47 3.80 - 5.10 Million/uL   Hemoglobin 14.1 11.7 - 15.5 g/dL   HCT 41.4 35.0 - 45.0 %   MCV 92.6 80.0 - 100.0 fL   MCH 31.5 27.0 - 33.0 pg   MCHC 34.1 32.0 - 36.0 g/dL   RDW 11.7 11.0 - 15.0 %   Platelets 209 140 - 400 Thousand/uL   MPV 9.5 7.5 - 12.5 fL   Neutro Abs 3,357 1,500 - 7,800 cells/uL   Lymphs Abs 1,098 850 - 3,900 cells/uL   Absolute Monocytes 377 200 - 950 cells/uL   Eosinophils Absolute 59 15 - 500 cells/uL   Basophils Absolute 10 0 - 200 cells/uL   Neutrophils Relative % 68.5 %   Total Lymphocyte 22.4 %   Monocytes Relative 7.7 %   Eosinophils Relative 1.2 %   Basophils Relative 0.2 %  COMPLETE METABOLIC PANEL WITH GFR     Status: Abnormal   Collection Time: 05/07/19  1:49 PM  Result Value Ref Range   Glucose, Bld 112 (H) 65 - 99 mg/dL    Comment: .            Fasting reference interval . For someone without known diabetes, a glucose value between 100 and 125 mg/dL is consistent with prediabetes and should be confirmed with a follow-up test. .    BUN 15 7 - 25 mg/dL   Creat 0.85 0.60 - 0.93 mg/dL    Comment: For patients >6 years of age, the reference limit for Creatinine is approximately 13% higher for people identified as African-American. .    GFR, Est Non African American 67 > OR = 60 mL/min/1.1m2   GFR, Est African American 78 > OR = 60 mL/min/1.59m2   BUN/Creatinine Ratio NOT APPLICABLE 6 - 22 (calc)   Sodium 141 135 - 146 mmol/L   Potassium 4.4 3.5 - 5.3 mmol/L   Chloride 103 98 - 110 mmol/L   CO2 28 20 - 32 mmol/L  Calcium 9.4 8.6 - 10.4 mg/dL   Total Protein 6.7 6.1 - 8.1 g/dL   Albumin 4.5 3.6 - 5.1 g/dL   Globulin 2.2 1.9 - 3.7 g/dL (calc)   AG Ratio 2.0 1.0 - 2.5 (calc)   Total Bilirubin 0.4 0.2 - 1.2 mg/dL   Alkaline phosphatase (APISO) 50 37 - 153 U/L   AST 19 10 - 35 U/L   ALT 11 6 - 29 U/L  TSH     Status: None   Collection  Time: 05/07/19  1:49 PM  Result Value Ref Range   TSH 1.45 0.40 - 4.50 mIU/L     PHQ2/9: Depression screen [facility] 2/9 06/03/2019 05/07/2019 01/08/2019 12/03/2018 01/08/2018  Decreased Interest 0 0 0 0 0  Down, Depressed, Hopeless 0 0 0 0 0  PHQ - 2 Score 0 0 0 0 0  Altered sleeping 0 0 1 1 2   Tired, decreased energy 0 0 0 0 0  Change in appetite 0 0 0 0 0  Feeling bad or failure about yourself  0 0 0 0 0  Trouble concentrating 0 0 0 0 0  Moving slowly or fidgety/restless 0 0 0 0 0  Suicidal thoughts 0 0 0 0 0  PHQ-9 Score 0 0 1 1 2   Difficult doing work/chores - Not difficult at all Not difficult at all Not difficult at all Not difficult at all    phq 9 is negative   Fall Risk: Fall Risk  06/03/2019 05/07/2019 01/08/2019 12/03/2018 01/08/2018  Falls in the past year? 0 0 0 0 Yes  Number falls in past yr: 0 0 0 0 2 or more  Comment - - - - February 2019  Injury with Fall? 0 0 0 0 (No Data)  Comment - - - - Hurt her wrist  Risk Factor Category  - - - - -  Comment - - - - -  Risk for fall due to : - - - - History of fall(s)  Risk for fall due to: Comment - - - - -  Follow up - - Falls prevention discussed - Falls prevention discussed    Functional Status Survey: Is the patient deaf or have difficulty hearing?: No Does the patient have difficulty seeing, even when wearing glasses/contacts?: No Does the patient have difficulty concentrating, remembering, or making decisions?: No Does the patient have difficulty walking or climbing stairs?: No Does the patient have difficulty dressing or bathing?: No Does the patient have difficulty doing errands alone such as visiting a doctor's office or shopping?: No    Assessment & Plan  1. Pure hypercholesterolemia  - rosuvastatin (CRESTOR) 20 MG tablet; Take 1 tablet (20 mg total) by mouth every evening.  Dispense: 90 tablet; Refill: 1  2. Chronic constipation  She will try Miralax daily   3. Gastroesophageal reflux disease without  esophagitis   4. Primary insomnia  Discussed love and kindness meditation   5. Age-related osteoporosis without current pathological fracture  Keep follow up with Dr. Gabriel Carina   6. Intermittent low back pain  Doing well   7. Mild stress incontinence  Discussed PT, she will try Miralax and fix his constipation    8. Bilateral impacted cerumen  Verbal consent given Possible side effects discussed with patient Ears were  lavaged with warm water and peroxide  Patient tolerated procedure well No complications

## 2019-06-03 NOTE — Addendum Note (Signed)
Addended by: Inda Coke on: 06/03/2019 12:05 PM   Modules accepted: Orders

## 2019-07-01 ENCOUNTER — Ambulatory Visit: Payer: PPO | Admitting: Urology

## 2019-11-27 DIAGNOSIS — M810 Age-related osteoporosis without current pathological fracture: Secondary | ICD-10-CM | POA: Diagnosis not present

## 2019-12-03 NOTE — Progress Notes (Signed)
Name: Jacqueline Kaiser   MRN: [id number]    DOB: July 22, 1945   Date:12/04/2019       Progress Note  Subjective  Chief Complaint  Chief Complaint  Patient presents with  . Gastroesophageal Reflux  . Hyperlipidemia  . Insomnia    HPI  Osteoporosis history of vertebral compression fracture: she went to [facility], vitamin D and basic metabolic panel was normal. Sheis still getting Prolia with Dr. Gabriel Carina and denies side effects of medication.  Last dose was 11/27/2019. She was previously on Fosamax for over 15 years and stopped 2018 , we will check bone density   Chronic lumbar spine pain: she states it has been going on for years, however fall of 2018 pain was severe and did not improve with chiropractor care, she was sent to [place] and found to have radiculitis..She is doing well without medication at this time. She states pain is usually in the morning and after she moves around it improves on its own. Usually aching and lumbar spine, stable  GERD:she is taking Omeprazole prn only, not very frequently again. She does not know what triggers symptoms   NSVT: she states diagnosed years ago by a cardiologist, no chest pain or palpation, she does not want to have an EKG today   Hyperlipidemia: taking Crestor, denies myalgia, chest pain or palpitation.Last LDL was 114, she wants to continue current dose and resume physical activity . We will recheck labs today   Insomnia: she states she falls asleep without problems, but tends to wake up in the middle of the night to void and moves to the living room where she turns on the TV and falls back asleep within 10-20 minutes. She states shewas taking Trazodone but did not work for her, she goes back to watch TV and falls asleep She is tired of getting up, we will try seroquel and if does not work try low dose clonazepam when she wakes up, discussed sleep hygiene again   Urinary incontinence/Constipation : she was doing well, but  recently went to see Urologist and Myrbetriq was added, she stopped Ditropan because she has noticed increase in constipation and states Myrbetriq did not make much difference. She states since taking Miralax daily and constipation under control usually Bristol 4, seldom Bristol 3, bowel movements once a day or at most every other . Still has urinary frequency     Patient Active Problem List   Diagnosis Date Noted  . Cystocele with prolapse 06/19/2018  . Age-related osteoporosis without current pathological fracture 01/08/2018  . History of vertebral compression fracture 05/23/2017  . Incomplete bladder emptying 05/16/2016  . GERD (gastroesophageal reflux disease) 02/14/2016  . Insomnia due to anxiety and fear 02/14/2016  . Allergy 07/01/2013  . Osteoporosis of lumbar spine 05/19/2013  . Overweight (BMI 25.0-29.9) 08/28/2011  . Peripheral vertigo 08/28/2011  . Nonsustained ventricular tachycardia ([place])   . Hyperlipidemia     Past Surgical History:  Procedure Laterality Date  . ABDOMINAL HYSTERECTOMY    . BREAST BIOPSY Right    negative 10-12 years ago- core  . CESAREAN SECTION    . COLONOSCOPY  2012  . CYSTOCELE REPAIR N/A 06/19/2018   Procedure: CYSTOSCOPY ANTERIOR REPAIR (CYSTOCELE);  Surgeon: Bjorn Loser, MD;  Location: [facility];  Service: Urology;  Laterality: N/A;  . TONSILLECTOMY      Family History  Problem Relation Age of Onset  . Heart disease Mother        ATRIAL FIB  .  Cancer Father 18       Lung Ca,  nonsmoker, metastatic at diagnosis  asbestos exposure  . Breast cancer Daughter 1       dx at age of 27 and 65    Social History   Tobacco Use  . Smoking status: Former Smoker    Packs/day: 0.50    Years: 10.00    Pack years: 5.00    Types: Cigarettes    Quit date: 1980    Years since quitting: 41.6  . Smokeless tobacco: Never Used  . Tobacco comment: smoking cessation materials not required  Substance Use Topics  . Alcohol use: Yes    Comment: wine  occasionally     Current Outpatient Medications:  .  Ascorbic Acid (VITAMIN C) 100 MG tablet, Take 100 mg by mouth daily., Disp: , Rfl:  .  Biotin 1 MG CAPS, Take by mouth., Disp: , Rfl:  .  calcium carbonate (OS-CAL) 600 MG TABS tablet, Take by mouth., Disp: , Rfl:  .  cholecalciferol (VITAMIN D3) 25 MCG (1000 UT) tablet, Take 1,000 Units by mouth daily., Disp: , Rfl:  .  meclizine (ANTIVERT) 12.5 MG tablet, Take 1 tablet (12.5 mg total) by mouth 4 (four) times daily as needed for dizziness., Disp: 30 tablet, Rfl: 0 .  omeprazole (PRILOSEC) 20 MG capsule, Take 20 mg by mouth daily., Disp: , Rfl:  .  PROLIA 60 MG/ML SOLN injection, Inject 1 each into the skin every 6 (six) months. , Disp: , Rfl:  .  vitamin B-12 (CYANOCOBALAMIN) 500 MCG tablet, Take 500 mcg by mouth daily., Disp: , Rfl:  .  clonazepam (KLONOPIN) 0.125 MG disintegrating tablet, Take 1 tablet (0.125 mg total) by mouth at bedtime., Disp: 30 tablet, Rfl: 0 .  QUEtiapine (SEROQUEL) 25 MG tablet, Take 1 tablet (25 mg total) by mouth at bedtime., Disp: 30 tablet, Rfl: 0 .  rosuvastatin (CRESTOR) 20 MG tablet, Take 1 tablet (20 mg total) by mouth every evening., Disp: 90 tablet, Rfl: 1  Allergies  Allergen Reactions  . Betadine [Povidone Iodine] Other (See Comments)    Burns  . Iodine     I personally reviewed active problem list, medication list, allergies, family history, social history with the patient/caregiver today.   ROS  Constitutional: Negative for fever or weight change.  Respiratory: Negative for cough and shortness of breath.   Cardiovascular: Negative for chest pain or palpitations.  Gastrointestinal: Negative for abdominal pain, no bowel changes.  Musculoskeletal: Negative for gait problem or joint swelling.  Skin: Negative for rash.  Neurological: Negative for dizziness or headache.  No other specific complaints in a complete review of systems (except as listed in HPI above).  Objective  Vitals:    12/04/19 1055  BP: 110/68  Pulse: 82  Resp: 16  Temp: 97.9 F (36.6 C)  TempSrc: Oral  SpO2: 98%  Weight: 181 lb 11.2 oz (82.4 kg)  Height: 5\' 7"  (1.702 m)    Body mass index is 28.46 kg/m.  Physical Exam  Constitutional: Patient appears well-developed and well-nourished. Overweight. No distress.  HEENT: head atraumatic, normocephalic, pupils equal and reactive to light,  neck supple Cardiovascular: Normal rate, regular rhythm and normal heart sounds.  No murmur heard. No BLE edema. Pulmonary/Chest: Effort normal and breath sounds normal. No respiratory distress. Abdominal: Soft.  There is no tenderness. Psychiatric: Patient has a normal mood and affect. behavior is normal. Judgment and thought content normal.  PHQ2/9: Depression screen [facility] 2/9 12/04/2019 06/03/2019  05/07/2019 01/08/2019 12/03/2018  Decreased Interest 0 0 0 0 0  Down, Depressed, Hopeless 0 0 0 0 0  PHQ - 2 Score 0 0 0 0 0  Altered sleeping - 0 0 1 1  Tired, decreased energy - 0 0 0 0  Change in appetite - 0 0 0 0  Feeling bad or failure about yourself  - 0 0 0 0  Trouble concentrating - 0 0 0 0  Moving slowly or fidgety/restless - 0 0 0 0  Suicidal thoughts - 0 0 0 0  PHQ-9 Score - 0 0 1 1  Difficult doing work/chores - - Not difficult at all Not difficult at all Not difficult at all    phq 9 is negative   Fall Risk: Fall Risk  12/04/2019 06/03/2019 05/07/2019 01/08/2019 12/03/2018  Falls in the past year? 0 0 0 0 0  Number falls in past yr: 0 0 0 0 0  Comment - - - - -  Injury with Fall? 0 0 0 0 0  Comment - - - - -  Risk Factor Category  - - - - -  Comment - - - - -  Risk for fall due to : - - - - -  Risk for fall due to: Comment - - - - -  Follow up - - - Falls prevention discussed -   Negative    Functional Status Survey: Is the patient deaf or have difficulty hearing?: No Does the patient have difficulty seeing, even when wearing glasses/contacts?: No Does the patient have difficulty concentrating,  remembering, or making decisions?: No Does the patient have difficulty walking or climbing stairs?: No Does the patient have difficulty dressing or bathing?: No Does the patient have difficulty doing errands alone such as visiting a doctor's office or shopping?: No   Assessment & Plan  1. Encounter for screening mammogram for malignant neoplasm of breast  - MM 3D SCREEN BREAST BILATERAL  2. Need for immunization against influenza  - Flu Vaccine QUAD High Dose(Fluad)  3. Pure hypercholesterolemia  - rosuvastatin (CRESTOR) 20 MG tablet; Take 1 tablet (20 mg total) by mouth every evening.  Dispense: 90 tablet; Refill: 1 - Lipid panel - COMPLETE METABOLIC PANEL WITH GFR  4. Nonsustained ventricular tachycardia ([place])  She states evaluated by cardiologist in the past, not having symptoms and does not want further evaluation at this time  5. Primary insomnia  Try one or the other  - QUEtiapine (SEROQUEL) 25 MG tablet; Take 1 tablet (25 mg total) by mouth at bedtime.  Dispense: 30 tablet; Refill: 0 - clonazepam (KLONOPIN) 0.125 MG disintegrating tablet; Take 1 tablet (0.125 mg total) by mouth at bedtime.  Dispense: 30 tablet; Refill: 0  6. Chronic constipation  Stable  7. Age-related osteoporosis without current pathological fracture  - DG Bone Density; Future  8. Gastroesophageal reflux disease without esophagitis

## 2019-12-04 ENCOUNTER — Encounter: Payer: Self-pay | Admitting: Family Medicine

## 2019-12-04 ENCOUNTER — Ambulatory Visit (INDEPENDENT_AMBULATORY_CARE_PROVIDER_SITE_OTHER): Payer: PPO | Admitting: Family Medicine

## 2019-12-04 ENCOUNTER — Other Ambulatory Visit: Payer: Self-pay

## 2019-12-04 VITALS — BP 110/68 | HR 82 | Temp 97.9°F | Resp 16 | Ht 67.0 in | Wt 181.7 lb

## 2019-12-04 DIAGNOSIS — M810 Age-related osteoporosis without current pathological fracture: Secondary | ICD-10-CM | POA: Diagnosis not present

## 2019-12-04 DIAGNOSIS — F5101 Primary insomnia: Secondary | ICD-10-CM | POA: Diagnosis not present

## 2019-12-04 DIAGNOSIS — E7800 Pure hypercholesterolemia, unspecified: Secondary | ICD-10-CM | POA: Diagnosis not present

## 2019-12-04 DIAGNOSIS — Z23 Encounter for immunization: Secondary | ICD-10-CM

## 2019-12-04 DIAGNOSIS — K219 Gastro-esophageal reflux disease without esophagitis: Secondary | ICD-10-CM

## 2019-12-04 DIAGNOSIS — I472 Ventricular tachycardia: Secondary | ICD-10-CM | POA: Diagnosis not present

## 2019-12-04 DIAGNOSIS — K5909 Other constipation: Secondary | ICD-10-CM

## 2019-12-04 DIAGNOSIS — Z1231 Encounter for screening mammogram for malignant neoplasm of breast: Secondary | ICD-10-CM

## 2019-12-04 DIAGNOSIS — I4729 Other ventricular tachycardia: Secondary | ICD-10-CM

## 2019-12-04 MED ORDER — QUETIAPINE FUMARATE 25 MG PO TABS: 25.0000 mg | ORAL_TABLET | Freq: Every day | ORAL | 0 refills | Status: DC

## 2019-12-04 MED ORDER — CLONAZEPAM 0.125 MG PO TBDP: 0.1250 mg | ORAL_TABLET | Freq: Every day | ORAL | 0 refills | Status: DC

## 2019-12-04 MED ORDER — ROSUVASTATIN CALCIUM 20 MG PO TABS: 20.0000 mg | ORAL_TABLET | Freq: Every evening | ORAL | 1 refills | Status: DC

## 2019-12-05 LAB — COMPLETE METABOLIC PANEL WITH GFR
AG Ratio: 1.9 (calc) (ref 1.0–2.5)
ALT: 7 U/L (ref 6–29)
AST: 16 U/L (ref 10–35)
Albumin: 4.4 g/dL (ref 3.6–5.1)
Alkaline phosphatase (APISO): 50 U/L (ref 37–153)
BUN/Creatinine Ratio: 16 (calc) (ref 6–22)
BUN: 16 mg/dL (ref 7–25)
CO2: 29 mmol/L (ref 20–32)
Calcium: 9.8 mg/dL (ref 8.6–10.4)
Chloride: 104 mmol/L (ref 98–110)
Creat: 0.98 mg/dL — ABNORMAL HIGH (ref 0.60–0.93)
GFR, Est African American: 65 mL/min/{1.73_m2} (ref >=60)
GFR, Est Non African American: 56 mL/min/{1.73_m2} — ABNORMAL LOW (ref >=60)
Globulin: 2.3 g/dL (calc) (ref 1.9–3.7)
Glucose, Bld: 100 mg/dL (ref 65–139)
Potassium: 5.6 mmol/L — ABNORMAL HIGH (ref 3.5–5.3)
Sodium: 142 mmol/L (ref 135–146)
Total Bilirubin: 0.6 mg/dL (ref 0.2–1.2)
Total Protein: 6.7 g/dL (ref 6.1–8.1)

## 2019-12-05 LAB — LIPID PANEL
Cholesterol: 207 mg/dL — ABNORMAL HIGH (ref <200)
HDL: 76 mg/dL (ref >=50)
LDL Cholesterol (Calc): 111 mg/dL (calc) — ABNORMAL HIGH
Non-HDL Cholesterol (Calc): 131 mg/dL (calc) — ABNORMAL HIGH (ref <130)
Total CHOL/HDL Ratio: 2.7 (calc) (ref <5.0)
Triglycerides: 92 mg/dL (ref <150)

## 2019-12-06 ENCOUNTER — Other Ambulatory Visit: Payer: Self-pay | Admitting: Family Medicine

## 2019-12-06 DIAGNOSIS — E875 Hyperkalemia: Secondary | ICD-10-CM

## 2019-12-27 ENCOUNTER — Other Ambulatory Visit: Payer: Self-pay | Admitting: Family Medicine

## 2019-12-27 DIAGNOSIS — F5101 Primary insomnia: Secondary | ICD-10-CM

## 2019-12-27 MED ORDER — QUETIAPINE FUMARATE 25 MG PO TABS: 25.0000 mg | ORAL_TABLET | Freq: Every day | ORAL | 1 refills | Status: DC

## 2020-01-09 ENCOUNTER — Ambulatory Visit (INDEPENDENT_AMBULATORY_CARE_PROVIDER_SITE_OTHER): Payer: PPO

## 2020-01-09 ENCOUNTER — Other Ambulatory Visit: Payer: Self-pay

## 2020-01-09 VITALS — BP 122/82 | HR 75 | Temp 97.9°F | Resp 16 | Ht 67.0 in | Wt 182.8 lb

## 2020-01-09 DIAGNOSIS — Z0000 Encounter for general adult medical examination without abnormal findings: Secondary | ICD-10-CM

## 2020-01-09 NOTE — Progress Notes (Signed)
Subjective:   Jacqueline Kaiser is a 74 y.o. female who presents for Medicare Annual (Subsequent) preventive examination.  Review of Systems     Cardiac Risk Factors include: advanced age (>90men, >5 women);dyslipidemia     Objective:    Today's Vitals   01/09/20 0836  BP: 122/82  Pulse: 75  Resp: 16  Temp: 97.9 F (36.6 C)  TempSrc: Oral  SpO2: 99%  Weight: 182 lb 12.8 oz (82.9 kg)  Height: 5\' 7"  (1.702 m)   Body mass index is 28.63 kg/m.  Advanced Directives 01/09/2020 01/08/2019 06/19/2018 06/15/2018 01/04/2018 03/21/2016  Does Patient Have a Medical Advance Directive? Yes Yes Yes Yes Yes Yes  Type of Paramedic of [place];Living will [place];Living will [place];Living will [place];Living will [place];Living will [place];Living will  Does patient want to make changes to medical advance directive? - - No - Patient declined No - Patient declined - No - Patient declined  Copy of [place] in Chart? Yes - validated most recent copy scanned in chart (See row information) Yes - validated most recent copy scanned in chart (See row information) No - copy requested No - copy requested No - copy requested Yes    Current Medications (verified) Outpatient Encounter Medications as of 01/09/2020  Medication Sig  . Ascorbic Acid (VITAMIN C) 100 MG tablet Take 100 mg by mouth daily.  . Biotin 1 MG CAPS Take by mouth.  . calcium carbonate (OS-CAL) 600 MG TABS tablet Take by mouth.  . cholecalciferol (VITAMIN D3) 25 MCG (1000 UT) tablet Take 1,000 Units by mouth daily.  . meclizine (ANTIVERT) 12.5 MG tablet Take 1 tablet (12.5 mg total) by mouth 4 (four) times daily as needed for dizziness.  Mar[place] Kitchen omeprazole (PRILOSEC) 20 MG capsule Take 20 mg by mouth daily.  Marland Kitchen PROLIA 60 MG/ML SOLN injection Inject 1 each into the skin every 6 (six) months.   .  QUEtiapine (SEROQUEL) 25 MG tablet Take 1 tablet (25 mg total) by mouth at bedtime.  . rosuvastatin (CRESTOR) 20 MG tablet Take 1 tablet (20 mg total) by mouth every evening.  . vitamin B-12 (CYANOCOBALAMIN) 500 MCG tablet Take 500 mcg by mouth daily.   No facility-administered encounter medications on file as of 01/09/2020.    Allergies (verified) Betadine [povidone iodine] and Iodine   History: Past Medical History:  Diagnosis Date  . Allergy   . Ankle fracture 2014   Left  . Anxiety   . Female cystocele    Grade 2  . Fracture    lower back after sneezing  . GERD (gastroesophageal reflux disease)    History of  . History of vertigo    last attack about 1 week ago  . Hyperlipidemia   . Insomnia   . 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   Right, .4 x 1.3 cm, noted on [place] ABD 05/2016, left lobe of the liver with the largest measuring 9.9 mm in diameter [place] ABD 06/2010  . Low back sprain   . Nonsustained ventricular tachycardia ([place])    by Holter,  noraml Stress test ECHO perpatient [name], [place])  . Obese   . Osteoporosis   . Sinusitis   . Urinary frequency   . Wrist fracture    Left   Past Surgical History:  Procedure Laterality Date  . ABDOMINAL HYSTERECTOMY    . BREAST BIOPSY Right    negative 10-12  years ago- core  . CESAREAN SECTION    . COLONOSCOPY  2012  . CYSTOCELE REPAIR N/A 06/19/2018   Procedure: CYSTOSCOPY ANTERIOR REPAIR (CYSTOCELE);  Surgeon: Bjorn Loser, MD;  Location: [facility];  Service: Urology;  Laterality: N/A;  . TONSILLECTOMY     Family History  Problem Relation Age of Onset  . Heart disease Mother        ATRIAL FIB  . Cancer Father 66       Lung Ca,  nonsmoker, metastatic at diagnosis  asbestos exposure  . Breast cancer Daughter 24       dx at age of 61 and 71   Social History   Socioeconomic History  . Marital status: Married    Spouse name: Octavia Bruckner  . Number of children: 2  . Years of education: Not on file  . Highest education level: 12th grade    Occupational History  . Occupation: Retired  Tobacco Use  . Smoking status: Former Smoker    Packs/day: 0.50    Years: 10.00    Pack years: 5.00    Types: Cigarettes    Quit date: 1980    Years since quitting: 41.7  . Smokeless tobacco: Never Used  . Tobacco comment: smoking cessation materials not required  Vaping Use  . Vaping Use: Never used  Substance and Sexual Activity  . Alcohol use: Yes    Comment: wine occasionally  . Drug use: No  . Sexual activity: Not Currently    Birth control/protection: Surgical  Other Topics Concern  . Not on file  Social History Narrative  . Not on file   Social Determinants of Health   Financial Resource Strain: Low Risk   . Difficulty of Paying Living Expenses: Not hard at all  Food Insecurity: No Food Insecurity  . Worried About Charity fundraiser in the Last Year: Never true  . Ran Out of Food in the Last Year: Never true  Transportation Needs: No Transportation Needs  . Lack of Transportation (Medical): No  . Lack of Transportation (Non-Medical): No  Physical Activity: Insufficiently Active  . Days of Exercise per Week: 2 days  . Minutes of Exercise per Session: 30 min  Stress: No Stress Concern Present  . Feeling of Stress : Not at all  Social Connections: Moderately Integrated  . Frequency of Communication with Friends and Family: More than three times a week  . Frequency of Social Gatherings with Friends and Family: More than three times a week  . Attends Religious Services: More than 4 times per year  . Active Member of Clubs or Organizations: No  . Attends Archivist Meetings: Never  . Marital Status: Married    Tobacco Counseling Counseling given: Not Answered Comment: smoking cessation materials not required   Clinical Intake:  Pre-visit preparation completed: Yes  Pain : No/denies pain     BMI - recorded: 28.63 Nutritional Risks: None Diabetes: No  How often do you need to have someone help  you when you read instructions, pamphlets, or other written materials from your doctor or pharmacy?: 1 - Never    Interpreter Needed?: No  Information entered by :: Clemetine Marker LPN   Activities of Daily Living In your present state of health, do you have any difficulty performing the following activities: 01/09/2020 12/04/2019  Hearing? N N  Comment declines hearing aids -  Vision? N N  Difficulty concentrating or making decisions? N N  Walking or climbing stairs? N N  Dressing or bathing? N N  Doing errands, shopping? N N  Preparing Food and eating ? N -  Using the Toilet? N -  In the past six months, have you accidently leaked urine? N -  Do you have problems with loss of bowel control? N -  Managing your Medications? N -  Managing your Finances? N -  Housekeeping or managing your Housekeeping? N -  Some recent data might be hidden    Patient Care Team: Steele Sizer, MD as PCP - General (Family Medicine) Gabriel Carina, Betsey Holiday, MD as Physician Assistant (Endocrinology)  Indicate any recent Medical Services you may have received from other than [facility] providers in the past year (date may be approximate).     Assessment:   This is a routine wellness examination for [facility].  Hearing/Vision screen  Hearing Screening   125Hz  250Hz  500Hz  1000Hz  2000Hz  3000Hz  4000Hz  6000Hz  8000Hz   Right ear:           Left ear:           Comments: Pt denies hearing difficulty   Vision Screening Comments: Annual vision screenings done at [facility] issues and exercise activities discussed: Current Exercise Habits: Home exercise routine, Type of exercise: walking, Time (Minutes): 30, Frequency (Times/Week): 2, Weekly Exercise (Minutes/Week): 60, Intensity: Mild, Exercise limited by: None identified  Goals    . DIET - INCREASE WATER INTAKE     Recommend to drink at least 6-8 8oz glasses of water per day.    . Increase physical activity     Water aerobic classes 3 times a week       Depression Screen PHQ 2/9 Scores 01/09/2020 12/04/2019 06/03/2019 05/07/2019 01/08/2019 12/03/2018 01/08/2018  PHQ - 2 Score 0 0 0 0 0 0 0  PHQ- 9 Score - - 0 0 1 1 2     Fall Risk Fall Risk  01/09/2020 12/04/2019 06/03/2019 05/07/2019 01/08/2019  Falls in the past year? 0 0 0 0 0  Number falls in past yr: 0 0 0 0 0  Comment - - - - -  Injury with Fall? 0 0 0 0 0  Comment - - - - -  Risk Factor Category  - - - - -  Comment - - - - -  Risk for fall due to : No Fall Risks - - - -  Risk for fall due to: Comment - - - - -  Follow up Falls prevention discussed - - - Falls prevention discussed    Any stairs in or around the home? Yes  If so, are there any without handrails? No  Home free of loose throw rugs in walkways, pet beds, electrical cords, etc? Yes  Adequate lighting in your home to reduce risk of falls? Yes   ASSISTIVE DEVICES UTILIZED TO PREVENT FALLS:  Life alert? No  Use of a cane, walker or w/c? No  Grab bars in the bathroom? Yes  Shower chair or bench in shower? No  Elevated toilet seat or a handicapped toilet? No   TIMED UP AND GO:  Was the test performed? Yes .  Length of time to ambulate 10 feet: 5 sec.   Gait steady and fast without use of assistive device  Cognitive Function: pt declined 6CIT for 2021 AWV     6CIT Screen 01/08/2019 01/04/2018 03/21/2016  What Year? 0 points 0 points 0 points  What month? 0 points 0 points 0 points  What time? 0 points 0 points 0 points  Count back from 20 0 points 0 points 0 points  Months in reverse 0 points 0 points 0 points  Repeat phrase 4 points 4 points -  Total Score 4 4 -    Immunizations Immunization History  Administered Date(s) Administered  . Fluad Quad(high Dose 65+) 12/03/2018, 12/04/2019  . Influenza Split 01/06/2012, 12/15/2012  . Influenza-Unspecified 12/04/2015, 12/18/2016, 12/29/2017  . PFIZER SARS-COV-2 Vaccination 04/29/2019, 05/20/2019  . Pneumococcal Conjugate-13 12/15/2014  . Pneumococcal  Polysaccharide-23 12/15/2007, 08/30/2017  . Tdap 10/13/2013, 10/30/2018  . Zoster Recombinat (Shingrix) 10/30/2018    TDAP status: Up to date   Flu Vaccine status: Up to date   Pneumococcal vaccine status: Up to date   Covid-19 vaccine status: Completed vaccines  Qualifies for Shingles Vaccine? Yes   Zostavax completed Yes   Shingrix Completed?: Yes  Screening Tests Health Maintenance  Topic Date Due  . MAMMOGRAM  01/07/2020  . TETANUS/TDAP  10/29/2028  . INFLUENZA VACCINE  Completed  . DEXA SCAN  Completed  . COVID-19 Vaccine  Completed  . Hepatitis C Screening  Completed  . PNA vac Low Risk Adult  Completed    Health Maintenance  Health Maintenance Due  Topic Date Due  . MAMMOGRAM  01/07/2020    Colorectal cancer screening: No longer required.    Mammogram status: Completed 01/07/19. Repeat every year   Bone Density status: Completed 06/20/17. Results reflect: Bone density results: OSTEOPENIA. Repeat every 2 years.  Lung Cancer Screening: (Low Dose CT Chest recommended if Age 86-80 years, 30 pack-year currently smoking OR have quit w/in 15years.) does not qualify.     Additional Screening:  Hepatitis C Screening: does qualify; Completed 12/15/14  Vision Screening: Recommended annual ophthalmology exams for early detection of glaucoma and other disorders of the eye. Is the patient up to date with their annual eye exam?  Yes  Who is the provider or what is the name of the office in which the patient attends annual eye exams? [facility]  Dental Screening: Recommended annual dental exams for proper oral hygiene  Community Resource Referral / Chronic Care Management: CRR required this visit?  No   CCM required this visit?  No      Plan:     I have personally reviewed and noted the following in the patient's chart:   . Medical and social history . Use of alcohol, tobacco or illicit drugs  . Current medications and supplements . Functional ability and  status . Nutritional status . Physical activity . Advanced directives . List of other physicians . Hospitalizations, surgeries, and ER visits in previous 12 months . Vitals . Screenings to include cognitive, depression, and falls . Referrals and appointments  In addition, I have reviewed and discussed with patient certain preventive protocols, quality metrics, and best practice recommendations. A written personalized care plan for preventive services as well as general preventive health recommendations were provided to patient.     Clemetine Marker, LPN   01/02/7509   Nurse Notes: pt doing well and appreciative of visit today.

## 2020-01-09 NOTE — Patient Instructions (Signed)
Jacqueline Kaiser , Thank you for taking time to come for your Medicare Wellness Visit. I appreciate your ongoing commitment to your health goals. Please review the following plan we discussed and let me know if I can assist you in the future.   Screening recommendations/referrals: Colonoscopy: no longer required Mammogram: done 01/07/19. Please call [phone] to schedule your mammogram and bone density screening.  Bone Density: done 06/20/17 Recommended yearly ophthalmology/optometry visit for glaucoma screening and checkup Recommended yearly dental visit for hygiene and checkup  Vaccinations: Influenza vaccine: done 12/04/19 Pneumococcal vaccine: done 12/15/14 Tdap vaccine: done 10/30/18 Shingles vaccine: done 10/30/18 - need second dose information    Covid-19:done 04/29/19 & 05/20/19  Conditions/risks identified: recommend increasing physical activity   Next appointment: Follow up in one year for your annual wellness visit    Preventive Care 23 Years and Older, Female Preventive care refers to lifestyle choices and visits with your health care provider that can promote health and wellness. What does preventive care include?  A yearly physical exam. This is also called an annual well check.  Dental exams once or twice a year.  Routine eye exams. Ask your health care provider how often you should have your eyes checked.  Personal lifestyle choices, including:  Daily care of your teeth and gums.  Regular physical activity.  Eating a healthy diet.  Avoiding tobacco and drug use.  Limiting alcohol use.  Practicing safe sex.  Taking low-dose aspirin every day.  Taking vitamin and mineral supplements as recommended by your health care provider. What happens during an annual well check? The services and screenings done by your health care provider during your annual well check will depend on your age, overall health, lifestyle risk factors, and family history of disease. Counseling   Your health care provider may ask you questions about your:  Alcohol use.  Tobacco use.  Drug use.  Emotional well-being.  Home and relationship well-being.  Sexual activity.  Eating habits.  History of falls.  Memory and ability to understand (cognition).  Work and work Statistician.  Reproductive health. Screening  You may have the following tests or measurements:  Height, weight, and BMI.  Blood pressure.  Lipid and cholesterol levels. These may be checked every 5 years, or more frequently if you are over 69 years old.  Skin check.  Lung cancer screening. You may have this screening every year starting at age 49 if you have a 30-pack-year history of smoking and currently smoke or have quit within the past 15 years.  Fecal occult blood test (FOBT) of the stool. You may have this test every year starting at age 95.  Flexible sigmoidoscopy or colonoscopy. You may have a sigmoidoscopy every 5 years or a colonoscopy every 10 years starting at age 60.  Hepatitis C blood test.  Hepatitis B blood test.  Sexually transmitted disease (STD) testing.  Diabetes screening. This is done by checking your blood sugar (glucose) after you have not eaten for a while (fasting). You may have this done every 1-3 years.  Bone density scan. This is done to screen for osteoporosis. You may have this done starting at age 84.  Mammogram. This may be done every 1-2 years. Talk to your health care provider about how often you should have regular mammograms. Talk with your health care provider about your test results, treatment options, and if necessary, the need for more tests. Vaccines  Your health care provider may recommend certain vaccines, such as:  Influenza vaccine.  This is recommended every year.  Tetanus, diphtheria, and acellular pertussis (Tdap, Td) vaccine. You may need a Td booster every 10 years.  Zoster vaccine. You may need this after age 58.  Pneumococcal 13-valent  conjugate (PCV13) vaccine. One dose is recommended after age 4.  Pneumococcal polysaccharide (PPSV23) vaccine. One dose is recommended after age 62. Talk to your health care provider about which screenings and vaccines you need and how often you need them. This information is not intended to replace advice given to you by your health care provider. Make sure you discuss any questions you have with your health care provider. Document Released: 04/17/2015 Document Revised: 12/09/2015 Document Reviewed: 01/20/2015 Elsevier Interactive Patient Education  2017 [place] Prevention in the Home Falls can cause injuries. They can happen to people of all ages. There are many things you can do to make your home safe and to help prevent falls. What can I do on the outside of my home?  Regularly fix the edges of walkways and driveways and fix any cracks.  Remove anything that might make you trip as you walk through a door, such as a raised step or threshold.  Trim any bushes or trees on the path to your home.  Use bright outdoor lighting.  Clear any walking paths of anything that might make someone trip, such as rocks or tools.  Regularly check to see if handrails are loose or broken. Make sure that both sides of any steps have handrails.  Any raised decks and porches should have guardrails on the edges.  Have any leaves, snow, or ice cleared regularly.  Use sand or salt on walking paths during winter.  Clean up any spills in your garage right away. This includes oil or grease spills. What can I do in the bathroom?  Use night lights.  Install grab bars by the toilet and in the tub and shower. Do not use towel bars as grab bars.  Use non-skid mats or decals in the tub or shower.  If you need to sit down in the shower, use a plastic, non-slip stool.  Keep the floor dry. Clean up any water that spills on the floor as soon as it happens.  Remove soap buildup in the tub or  shower regularly.  Attach bath mats securely with double-sided non-slip rug tape.  Do not have throw rugs and other things on the floor that can make you trip. What can I do in the bedroom?  Use night lights.  Make sure that you have a light by your bed that is easy to reach.  Do not use any sheets or blankets that are too big for your bed. They should not hang down onto the floor.  Have a firm chair that has side arms. You can use this for support while you get dressed.  Do not have throw rugs and other things on the floor that can make you trip. What can I do in the kitchen?  Clean up any spills right away.  Avoid walking on wet floors.  Keep items that you use a lot in easy-to-reach places.  If you need to reach something above you, use a strong step stool that has a grab bar.  Keep electrical cords out of the way.  Do not use floor polish or wax that makes floors slippery. If you must use wax, use non-skid floor wax.  Do not have throw rugs and other things on the floor that can make  you trip. What can I do with my stairs?  Do not leave any items on the stairs.  Make sure that there are handrails on both sides of the stairs and use them. Fix handrails that are broken or loose. Make sure that handrails are as long as the stairways.  Check any carpeting to make sure that it is firmly attached to the stairs. Fix any carpet that is loose or worn.  Avoid having throw rugs at the top or bottom of the stairs. If you do have throw rugs, attach them to the floor with carpet tape.  Make sure that you have a light switch at the top of the stairs and the bottom of the stairs. If you do not have them, ask someone to add them for you. What else can I do to help prevent falls?  Wear shoes that:  Do not have high heels.  Have rubber bottoms.  Are comfortable and fit you well.  Are closed at the toe. Do not wear sandals.  If you use a stepladder:  Make sure that it is fully  opened. Do not climb a closed stepladder.  Make sure that both sides of the stepladder are locked into place.  Ask someone to hold it for you, if possible.  Clearly mark and make sure that you can see:  Any grab bars or handrails.  First and last steps.  Where the edge of each step is.  Use tools that help you move around (mobility aids) if they are needed. These include:  Canes.  Walkers.  Scooters.  Crutches.  Turn on the lights when you go into a dark area. Replace any light bulbs as soon as they burn out.  Set up your furniture so you have a clear path. Avoid moving your furniture around.  If any of your floors are uneven, fix them.  If there are any pets around you, be aware of where they are.  Review your medicines with your doctor. Some medicines can make you feel dizzy. This can increase your chance of falling. Ask your doctor what other things that you can do to help prevent falls. This information is not intended to replace advice given to you by your health care provider. Make sure you discuss any questions you have with your health care provider. Document Released: 01/15/2009 Document Revised: 08/27/2015 Document Reviewed: 04/25/2014 Elsevier Interactive Patient Education  2017 [employer].

## 2020-02-25 ENCOUNTER — Ambulatory Visit
Admission: RE | Admit: 2020-02-25 | Discharge: 2020-02-25 | Disposition: A | Discharge status: Home / Self Care | Payer: PPO | Source: Ambulatory Visit | Attending: Family Medicine | Admitting: Family Medicine

## 2020-02-25 ENCOUNTER — Other Ambulatory Visit: Payer: Self-pay

## 2020-02-25 DIAGNOSIS — E559 Vitamin D deficiency, unspecified: Secondary | ICD-10-CM | POA: Diagnosis not present

## 2020-02-25 DIAGNOSIS — Z1231 Encounter for screening mammogram for malignant neoplasm of breast: Secondary | ICD-10-CM | POA: Diagnosis not present

## 2020-02-25 DIAGNOSIS — M85852 Other specified disorders of bone density and structure, left thigh: Secondary | ICD-10-CM | POA: Diagnosis not present

## 2020-02-25 DIAGNOSIS — M810 Age-related osteoporosis without current pathological fracture: Secondary | ICD-10-CM | POA: Diagnosis not present

## 2020-02-25 DIAGNOSIS — R29890 Loss of height: Secondary | ICD-10-CM | POA: Diagnosis not present

## 2020-02-25 DIAGNOSIS — Z780 Asymptomatic menopausal state: Secondary | ICD-10-CM | POA: Diagnosis not present

## 2020-06-01 DIAGNOSIS — M810 Age-related osteoporosis without current pathological fracture: Secondary | ICD-10-CM | POA: Diagnosis not present

## 2020-06-02 ENCOUNTER — Ambulatory Visit: Payer: PPO | Admitting: Family Medicine

## 2020-07-02 NOTE — Progress Notes (Signed)
Name: Jacqueline Kaiser   MRN: [id number]    DOB: 1945-07-17   Date:07/03/2020       Progress Note  Subjective  Chief Complaint  Follow up   HPI   Osteoporosis history of vertebral compression fracture: she went to [facility], vitamin D and basic metabolic panel was normal. Sheis still getting Prolia with Dr. Gabriel Carina and denies side effects of medication.  Last dose was 11/27/2019. She was previously on Fosamax for over 15 years and stopped 2018 , ordered bone density last year, but she will follow up with Endo   Chronic lumbar spine pain: she states it has been going on for years, however fall of 2018 pain was severe and did not improve with chiropractor care, she was sent to [facility] and found to have radiculitis.She is doing well without medication at this time. She states pain now is intermittent , not necessarily when she is at work. Worse when standing still. . Usually aching and lumbar spine.   GERD:she is taking Omeprazole prn only, she takes it less than once a week. She does not know what triggers symptoms   NSVT: she states diagnosed years ago by a cardiologist, no chest pain or palpation, she denies any symptoms.   Hyperlipidemia: taking Crestor, denies myalgia, chest pain or palpitation.Last LDL was 114, she wants to continue current dose and resume physical activity . We will recheck labs today   Insomnia: she states she falls asleep without problems, but tends to wake up in the middle of the night to void and moves to the living room where she turns on the TV and falls back asleep within 10-20 minutes. She states shewas taking Trazodone but did not work for her, she is currently on Seroquel and works well, she denies feeling groggy the following day, she needs a refill today    Urinary incontinence/Constipation : she was doing well, but recently went to see Urologist and has tried [employer] and ditropan without much help , she stopped taking it.  She states  since taking Miralax daily and constipation under control usually Bristol 4, seldom Bristol 3, bowel movements once a day or at most every other. Still has urinary urgency more than frequency at this time.   Foot pain: she states she works 2-3 days a week for 8 hours and states her feet hurts at the end of the day, she is trying new shoes.   Patient Active Problem List   Diagnosis Date Noted  . Cystocele with prolapse 06/19/2018  . Age-related osteoporosis without current pathological fracture 01/08/2018  . History of vertebral compression fracture 05/23/2017  . Incomplete bladder emptying 05/16/2016  . GERD (gastroesophageal reflux disease) 02/14/2016  . Insomnia due to anxiety and fear 02/14/2016  . Allergy 07/01/2013  . Osteoporosis of lumbar spine 05/19/2013  . Overweight (BMI 25.0-29.9) 08/28/2011  . Peripheral vertigo 08/28/2011  . Nonsustained ventricular tachycardia ([place])   . Hyperlipidemia     Past Surgical History:  Procedure Laterality Date  . ABDOMINAL HYSTERECTOMY    . BREAST BIOPSY Right    negative 10-12 years ago- core  . CESAREAN SECTION    . COLONOSCOPY  2012  . CYSTOCELE REPAIR N/A 06/19/2018   Procedure: CYSTOSCOPY ANTERIOR REPAIR (CYSTOCELE);  Surgeon: Bjorn Loser, MD;  Location: [facility];  Service: Urology;  Laterality: N/A;  . TONSILLECTOMY      Family History  Problem Relation Age of Onset  . Heart disease Mother  ATRIAL FIB  . Cancer Father 50       Lung Ca,  nonsmoker, metastatic at diagnosis  asbestos exposure  . Breast cancer Daughter 72       dx at age of 48 and 65    Social History   Tobacco Use  . Smoking status: Former Smoker    Packs/day: 0.50    Years: 10.00    Pack years: 5.00    Types: Cigarettes    Quit date: 1980    Years since quitting: 42.2  . Smokeless tobacco: Never Used  . Tobacco comment: smoking cessation materials not required  Substance Use Topics  . Alcohol use: Yes    Comment: wine occasionally      Current Outpatient Medications:  .  Ascorbic Acid (VITAMIN C) 100 MG tablet, Take 100 mg by mouth daily., Disp: , Rfl:  .  Biotin 1 MG CAPS, Take by mouth., Disp: , Rfl:  .  calcium carbonate (OS-CAL) 600 MG TABS tablet, Take by mouth., Disp: , Rfl:  .  cholecalciferol (VITAMIN D3) 25 MCG (1000 UT) tablet, Take 1,000 Units by mouth daily., Disp: , Rfl:  .  meclizine (ANTIVERT) 12.5 MG tablet, Take 1 tablet (12.5 mg total) by mouth 4 (four) times daily as needed for dizziness., Disp: 30 tablet, Rfl: 0 .  omeprazole (PRILOSEC) 20 MG capsule, Take 20 mg by mouth daily., Disp: , Rfl:  .  PROLIA 60 MG/ML SOLN injection, Inject 1 each into the skin every 6 (six) months. , Disp: , Rfl:  .  QUEtiapine (SEROQUEL) 25 MG tablet, Take 1 tablet (25 mg total) by mouth at bedtime., Disp: 90 tablet, Rfl: 1 .  rosuvastatin (CRESTOR) 20 MG tablet, Take 1 tablet (20 mg total) by mouth every evening., Disp: 90 tablet, Rfl: 1 .  vitamin B-12 (CYANOCOBALAMIN) 500 MCG tablet, Take 500 mcg by mouth daily., Disp: , Rfl:   Allergies  Allergen Reactions  . Betadine [Povidone Iodine] Other (See Comments)    Burns  . Iodine     I personally reviewed active problem list, medication list, allergies, family history, social history, health maintenance with the patient/caregiver today.   ROS  Constitutional: Negative for fever or weight change.  Respiratory: Negative for cough and shortness of breath.   Cardiovascular: Negative for chest pain or palpitations.  Gastrointestinal: Negative for abdominal pain, no bowel changes.  Musculoskeletal: Negative for gait problem or joint swelling.  Skin: Negative for rash.  Neurological: Negative for dizziness or headache.  No other specific complaints in a complete review of systems (except as listed in HPI above).  Objective  Vitals:   07/03/20 1338  BP: 122/80  Pulse: 80  Resp: 16  Temp: 98 F (36.7 C)  TempSrc: Oral  SpO2: 97%  Weight: 186 lb (84.4 kg)   Height: 5\' 7"  (1.702 m)    Body mass index is 29.13 kg/m.  Physical Exam  Constitutional: Patient appears well-developed and well-nourished.  No distress.  HEENT: head atraumatic, normocephalic, pupils equal and reactive to light,  neck supple Cardiovascular: Normal rate, regular rhythm and normal heart sounds.  No murmur heard. No BLE edema. Pulmonary/Chest: Effort normal and breath sounds normal. No respiratory distress. Abdominal: Soft.  There is no tenderness. Psychiatric: Patient has a normal mood and affect. behavior is normal. Judgment and thought content normal.  PHQ2/9: Depression screen [facility] 2/9 07/03/2020 01/09/2020 12/04/2019 06/03/2019 05/07/2019  Decreased Interest 0 0 0 0 0  Down, Depressed, Hopeless 0 0 0 0  0  PHQ - 2 Score 0 0 0 0 0  Altered sleeping - - - 0 0  Tired, decreased energy - - - 0 0  Change in appetite - - - 0 0  Feeling bad or failure about yourself  - - - 0 0  Trouble concentrating - - - 0 0  Moving slowly or fidgety/restless - - - 0 0  Suicidal thoughts - - - 0 0  PHQ-9 Score - - - 0 0  Difficult doing work/chores - - - - Not difficult at all  Some recent data might be hidden    phq 9 is negative   Fall Risk: Fall Risk  07/03/2020 01/09/2020 12/04/2019 06/03/2019 05/07/2019  Falls in the past year? 0 0 0 0 0  Number falls in past yr: 0 0 0 0 0  Comment - - - - -  Injury with Fall? 0 0 0 0 0  Comment - - - - -  Risk Factor Category  - - - - -  Comment - - - - -  Risk for fall due to : - No Fall Risks - - -  Risk for fall due to: Comment - - - - -  Follow up - Falls prevention discussed - - -     Functional Status Survey: Is the patient deaf or have difficulty hearing?: No Does the patient have difficulty seeing, even when wearing glasses/contacts?: No Does the patient have difficulty concentrating, remembering, or making decisions?: No Does the patient have difficulty walking or climbing stairs?: No Does the patient have difficulty dressing or  bathing?: No Does the patient have difficulty doing errands alone such as visiting a doctor's office or shopping?: No    Assessment & Plan  1. Pure hypercholesterolemia  - rosuvastatin (CRESTOR) 20 MG tablet; Take 1 tablet (20 mg total) by mouth every evening.  Dispense: 90 tablet; Refill: 1  2. Chronic constipation   3. Gastroesophageal reflux disease without esophagitis  Continue medication prn   4. Age-related osteoporosis without current pathological fracture  Continue Prolia   5. Primary insomnia  - QUEtiapine (SEROQUEL) 25 MG tablet; Take 1 tablet (25 mg total) by mouth at bedtime.  Dispense: 90 tablet; Refill: 1  6. Nonsustained ventricular tachycardia (HCC)  No problems at this time  7. Mild stress incontinence  Stable  8. Intermittent low back pain

## 2020-07-03 ENCOUNTER — Other Ambulatory Visit: Payer: Self-pay

## 2020-07-03 ENCOUNTER — Encounter: Payer: Self-pay | Admitting: Family Medicine

## 2020-07-03 ENCOUNTER — Ambulatory Visit (INDEPENDENT_AMBULATORY_CARE_PROVIDER_SITE_OTHER): Payer: PPO | Admitting: Family Medicine

## 2020-07-03 VITALS — BP 122/80 | HR 80 | Temp 98.0°F | Resp 16 | Ht 67.0 in | Wt 186.0 lb

## 2020-07-03 DIAGNOSIS — K219 Gastro-esophageal reflux disease without esophagitis: Secondary | ICD-10-CM | POA: Diagnosis not present

## 2020-07-03 DIAGNOSIS — F5101 Primary insomnia: Secondary | ICD-10-CM

## 2020-07-03 DIAGNOSIS — E7800 Pure hypercholesterolemia, unspecified: Secondary | ICD-10-CM

## 2020-07-03 DIAGNOSIS — N393 Stress incontinence (female) (male): Secondary | ICD-10-CM

## 2020-07-03 DIAGNOSIS — M810 Age-related osteoporosis without current pathological fracture: Secondary | ICD-10-CM | POA: Diagnosis not present

## 2020-07-03 DIAGNOSIS — M545 Low back pain, unspecified: Secondary | ICD-10-CM

## 2020-07-03 DIAGNOSIS — I472 Ventricular tachycardia: Secondary | ICD-10-CM

## 2020-07-03 DIAGNOSIS — K5909 Other constipation: Secondary | ICD-10-CM

## 2020-07-03 DIAGNOSIS — I4729 Other ventricular tachycardia: Secondary | ICD-10-CM

## 2020-07-03 MED ORDER — QUETIAPINE FUMARATE 25 MG PO TABS: 25.0000 mg | ORAL_TABLET | Freq: Every day | ORAL | 1 refills | Status: DC

## 2020-07-03 MED ORDER — ROSUVASTATIN CALCIUM 20 MG PO TABS: 20.0000 mg | ORAL_TABLET | Freq: Every evening | ORAL | 1 refills | Status: DC

## 2020-08-10 IMAGING — MG MM DIGITAL SCREENING BILAT W/ TOMO W/ CAD
8 series · 8 of 24 positions shown · non-contrast
Comparison: Previous exam(s).

CLINICAL DATA: Screening.

EXAM:
DIGITAL SCREENING BILATERAL MAMMOGRAM WITH TOMO AND CAD

[L MLO synth-2D]
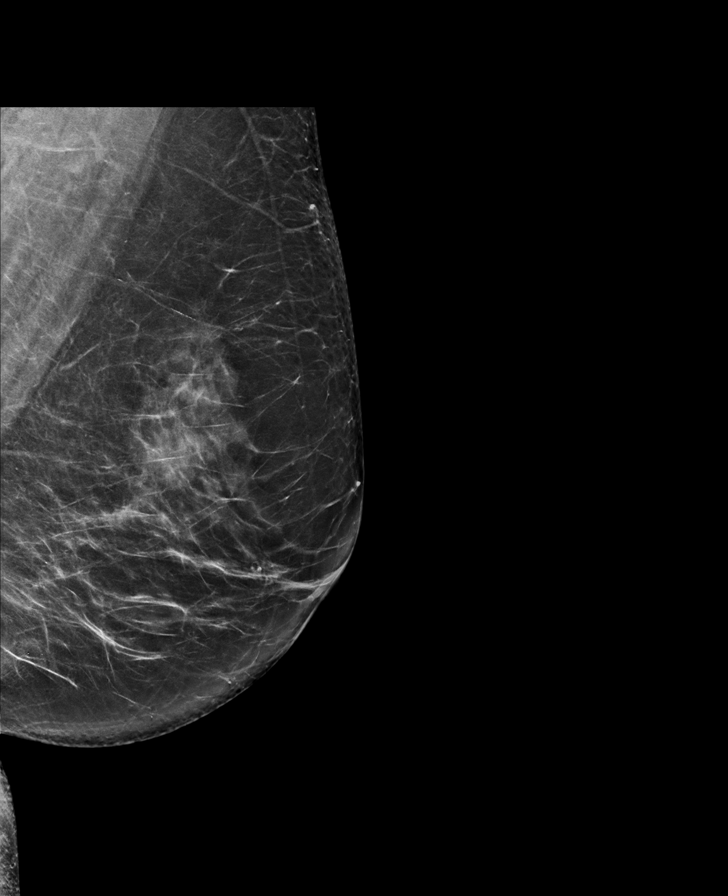

[L CC synth-2D]
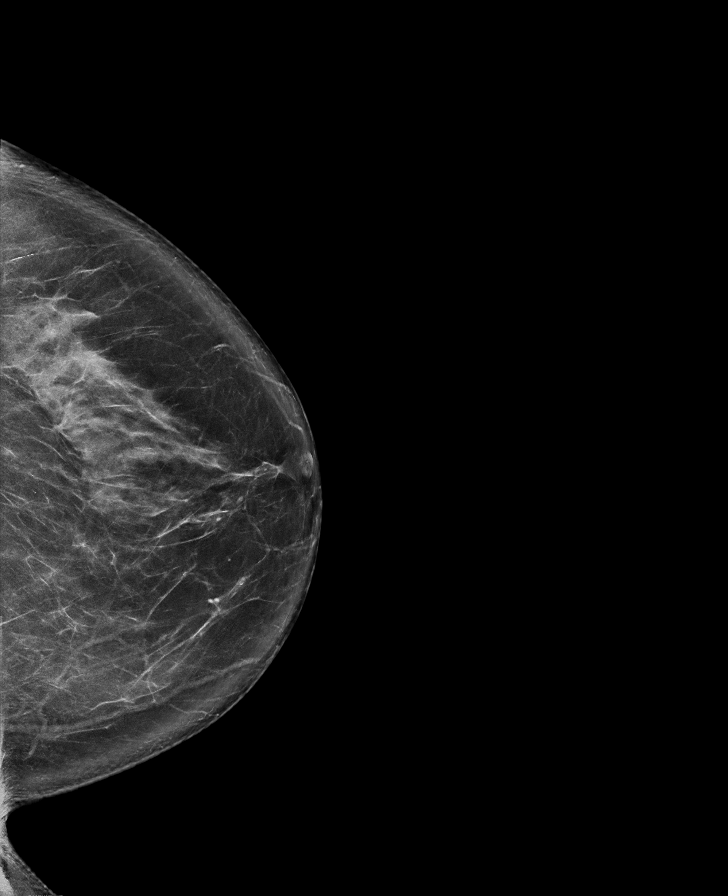

[R CC synth-2D]
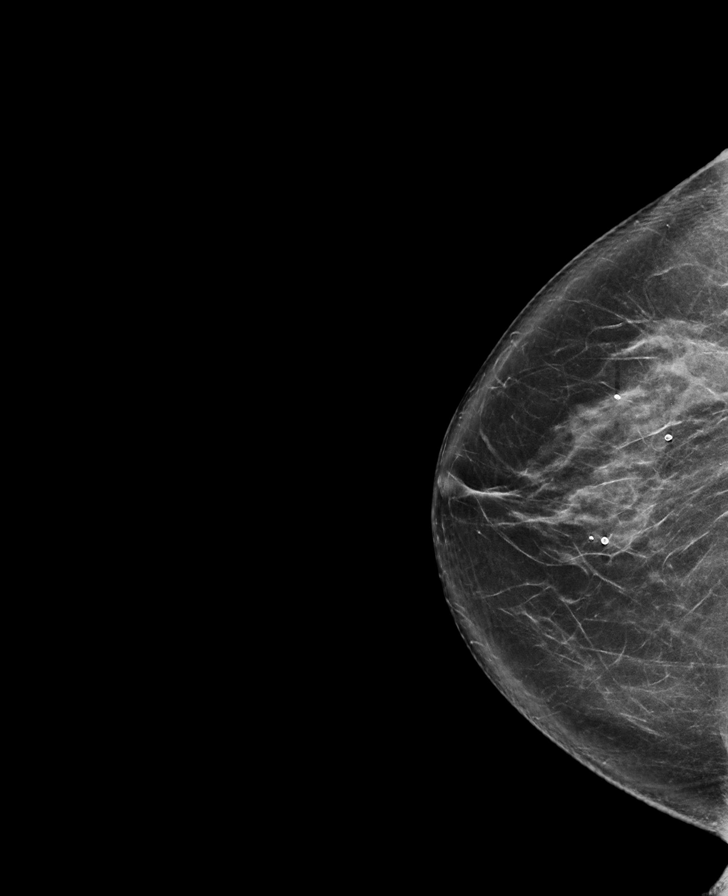

[R MLO synth-2D]
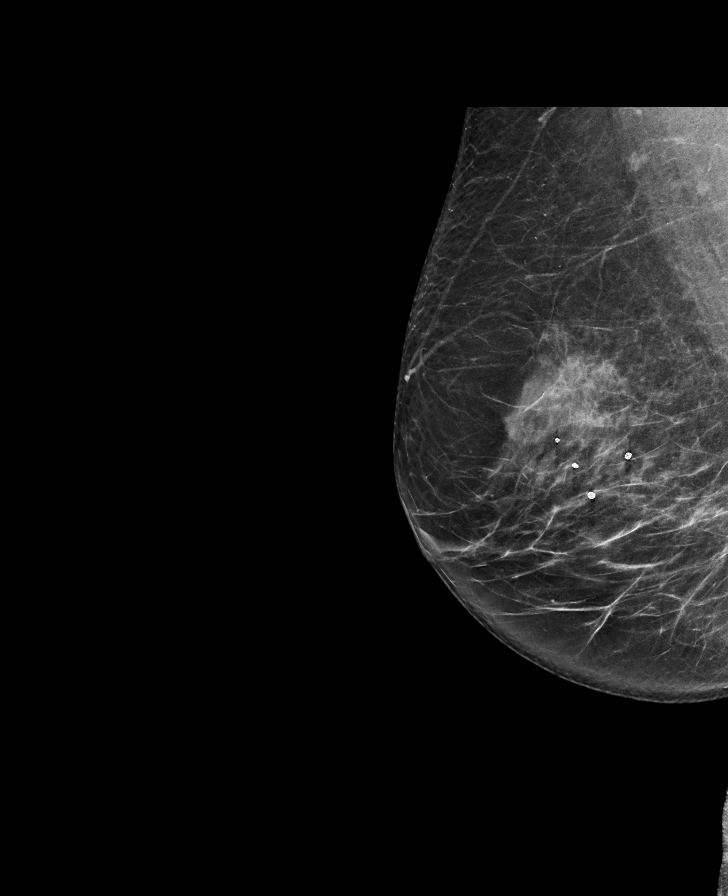

[R CC tomo · tomo slice 45/88.0]
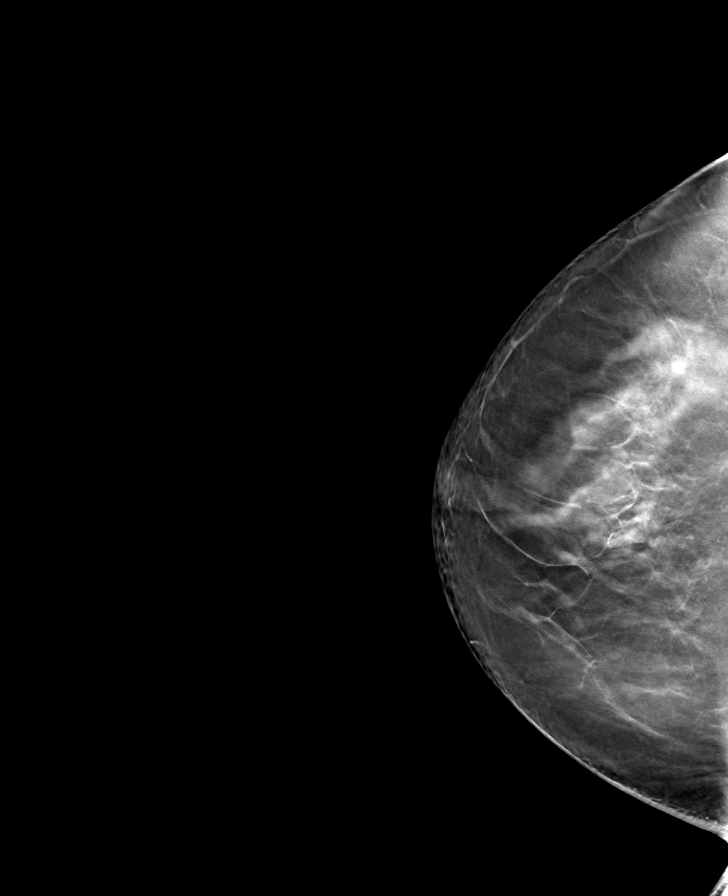

[L MLO tomo · tomo slice 43/85.0]
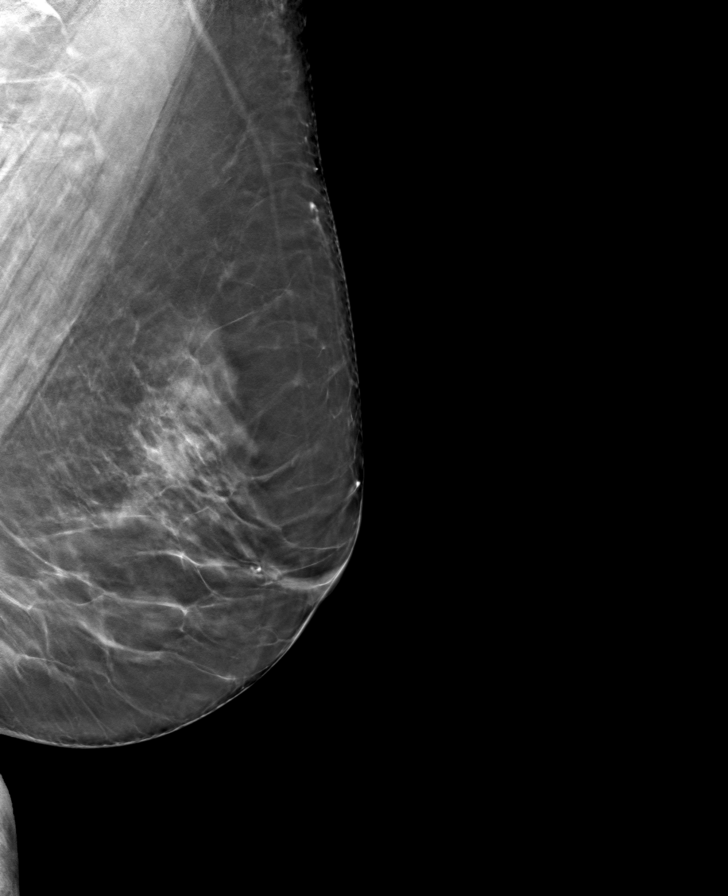

[L CC tomo · tomo slice 45/90.0]
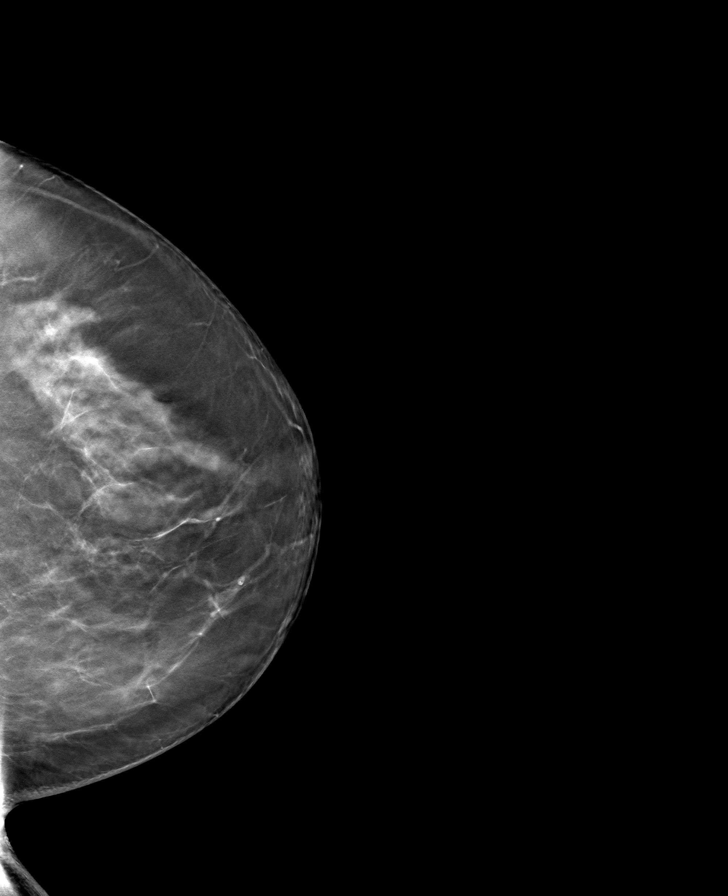

[R MLO tomo · tomo slice 41/80.0]
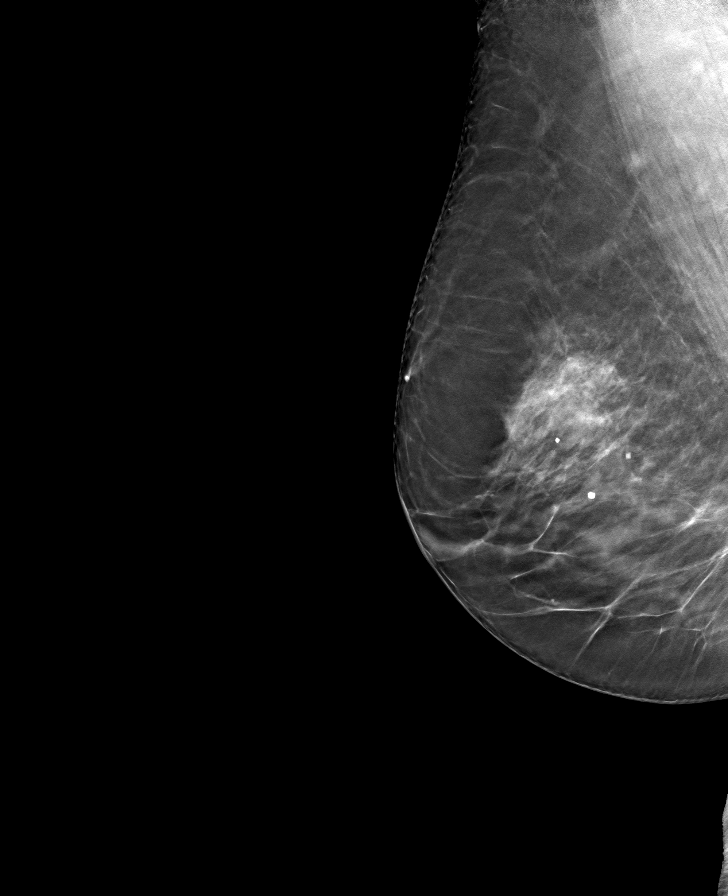

[8 of 24 positions shown; findings below may reference images not displayed]

ACR Breast Density Category b: There are scattered areas of
fibroglandular density.
FINDINGS: There are no findings suspicious for malignancy. Images were
processed with CAD.
IMPRESSION: No mammographic evidence of malignancy. A result letter of this
screening mammogram will be mailed directly to the patient.

RECOMMENDATION:
Screening mammogram in one year. (Code:[id number])

BI-RADS CATEGORY  1: Negative.

## 2020-09-09 ENCOUNTER — Telehealth: Payer: Self-pay

## 2020-09-09 NOTE — Telephone Encounter (Signed)
Copied from [place] [id number]. Topic: General - Inquiry >> Sep 09, 2020  9:53 AM Valere Dross wrote: Reason for CRM: Pt called in regards of finding a Allergist and a Orthopedic specialist, pt request a call back, also states if she doesn't pick up, that a vm can be left. Please advise

## 2020-09-09 NOTE — Telephone Encounter (Signed)
Pt called these were recommendations for husband and friend.

## 2020-09-11 ENCOUNTER — Other Ambulatory Visit: Payer: Self-pay | Admitting: Family Medicine

## 2020-09-11 DIAGNOSIS — F5101 Primary insomnia: Secondary | ICD-10-CM

## 2020-09-11 NOTE — Telephone Encounter (Signed)
Last appt was 07/03/2020 1:31 PM. Has another appt on 01-04-2021

## 2020-09-11 NOTE — Telephone Encounter (Signed)
Requested medication (s) are due for refill today: no  Requested medication (s) are on the active medication list: yes   Last refill: 07/03/2020  Future visit scheduled:  yes  Notes to clinic: this refill cannot be delegated    Requested Prescriptions  Pending Prescriptions Disp Refills   QUEtiapine (SEROQUEL) 25 MG tablet [Pharmacy Med Name: QUETIAPINE FUMARATE 25 MG TAB] 90 tablet 1    Sig: TAKE 1 TABLET BY MOUTH AT BEDTIME.      Not Delegated - Psychiatry:  Antipsychotics - Second Generation (Atypical) - quetiapine Failed - 09/11/2020  1:56 PM      Failed - This refill cannot be delegated      Failed - ALT in normal range and within 180 days    ALT  Date Value Ref Range Status  12/04/2019 7 6 - 29 U/L Final          Failed - AST in normal range and within 180 days    AST  Date Value Ref Range Status  12/04/2019 16 10 - 35 U/L Final          Passed - Last BP in normal range    BP Readings from Last 1 Encounters:  07/03/20 122/80          Passed - Valid encounter within last 6 months    Recent Outpatient Visits           2 months ago Pure hypercholesterolemia   [facility] Steele Sizer, MD   9 months ago Encounter for screening mammogram for malignant neoplasm of breast   [facility] Steele Sizer, MD   1 year ago Chronic constipation   [facility] Steele Sizer, MD   1 year ago Constipation, unspecified constipation type   [facility] Delsa Grana, PA-C   1 year ago Needs flu shot   [facility] Steele Sizer, MD       Future Appointments             In 3 months Ancil Boozer, Drue Stager, MD [facility] [place]   In 4 months  [place]

## 2020-12-01 DIAGNOSIS — M810 Age-related osteoporosis without current pathological fracture: Secondary | ICD-10-CM | POA: Diagnosis not present

## 2020-12-08 IMAGING — CR DG ABDOMEN 1V
1 series · 2 of 2 positions shown · non-contrast
Comparison: None.

CLINICAL DATA: Constipation

EXAM:
ABDOMEN - 1 VIEW

[Series 1: dg abd 1 view · 0.14mm/px · 2 of 2 slices shown]
[im 1/2]
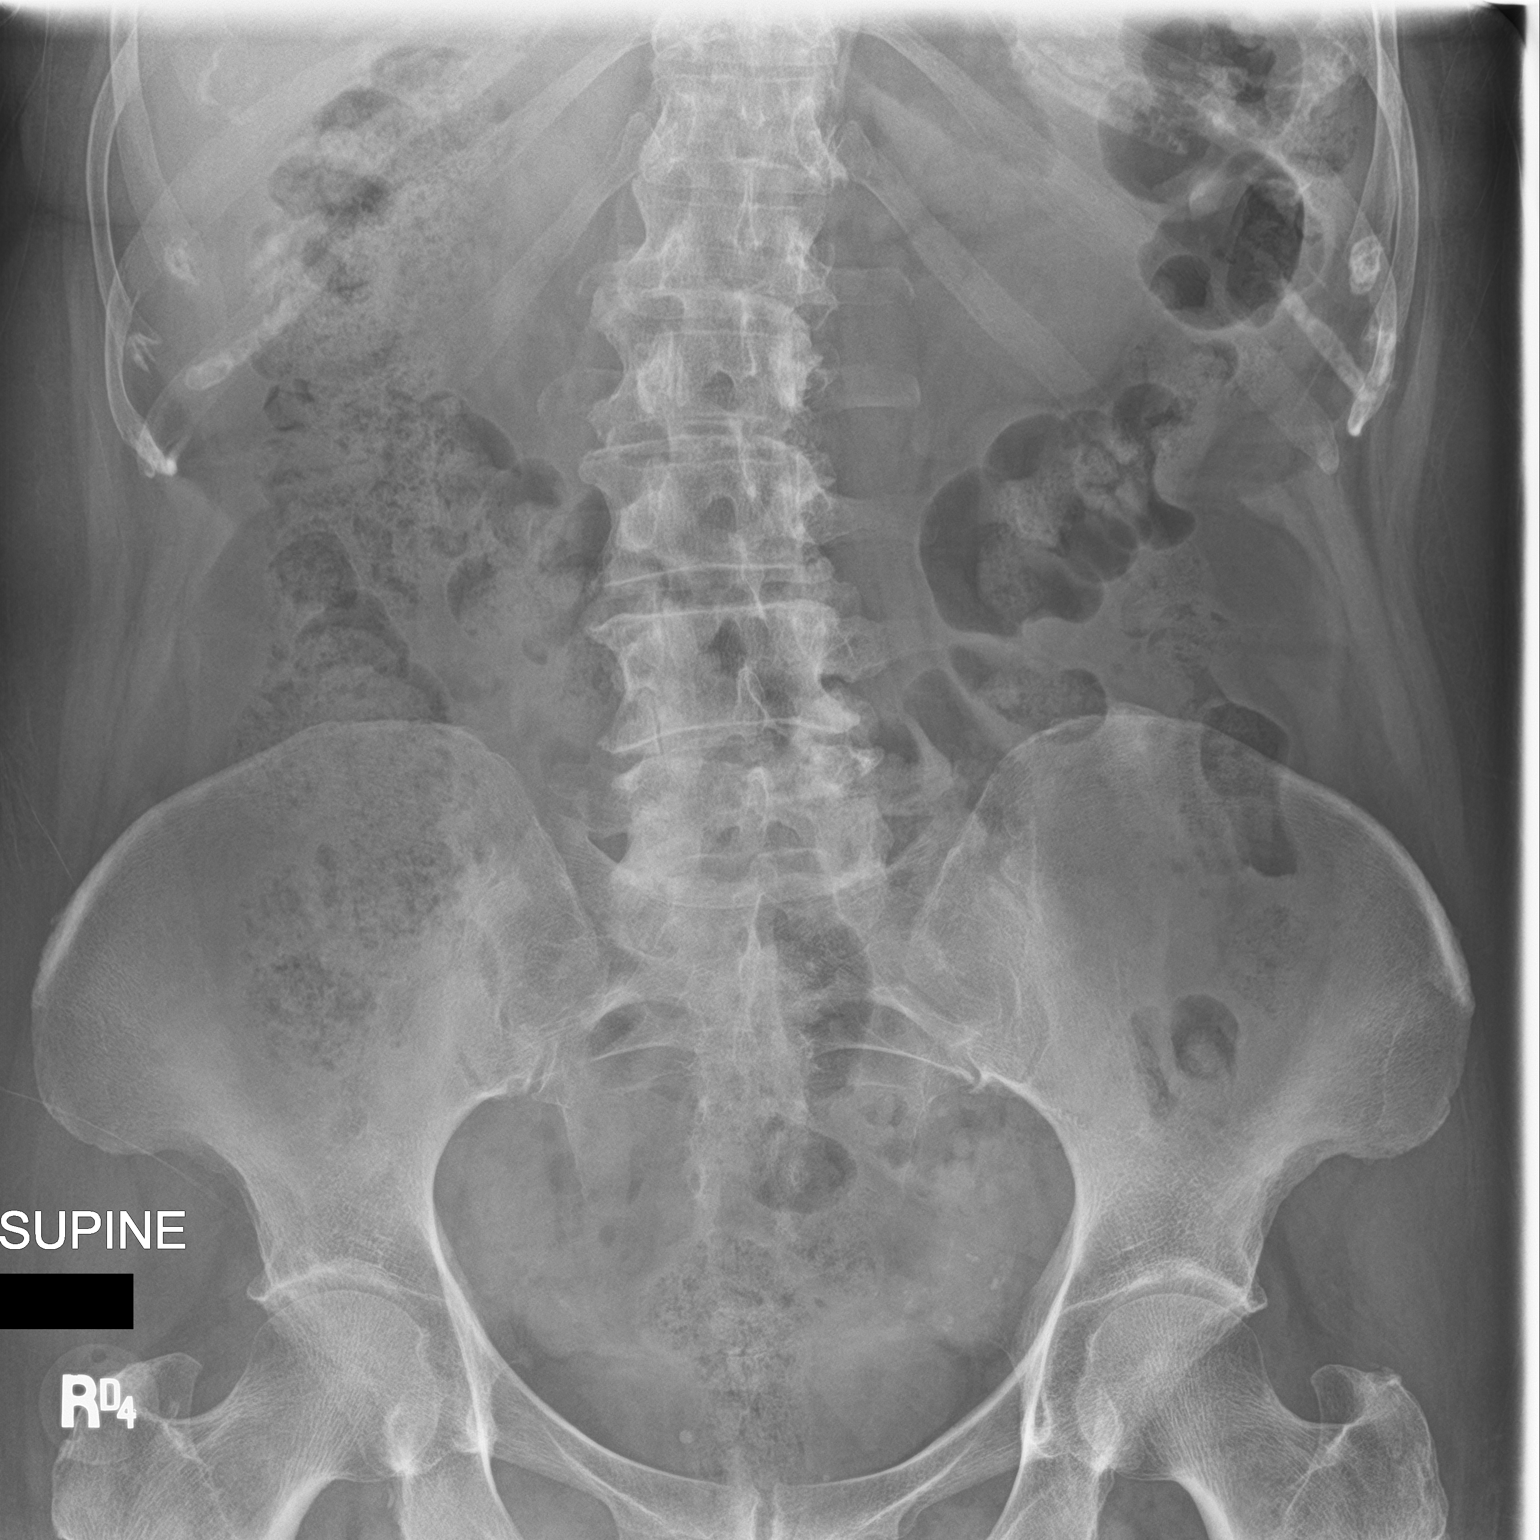
[im 2/2]
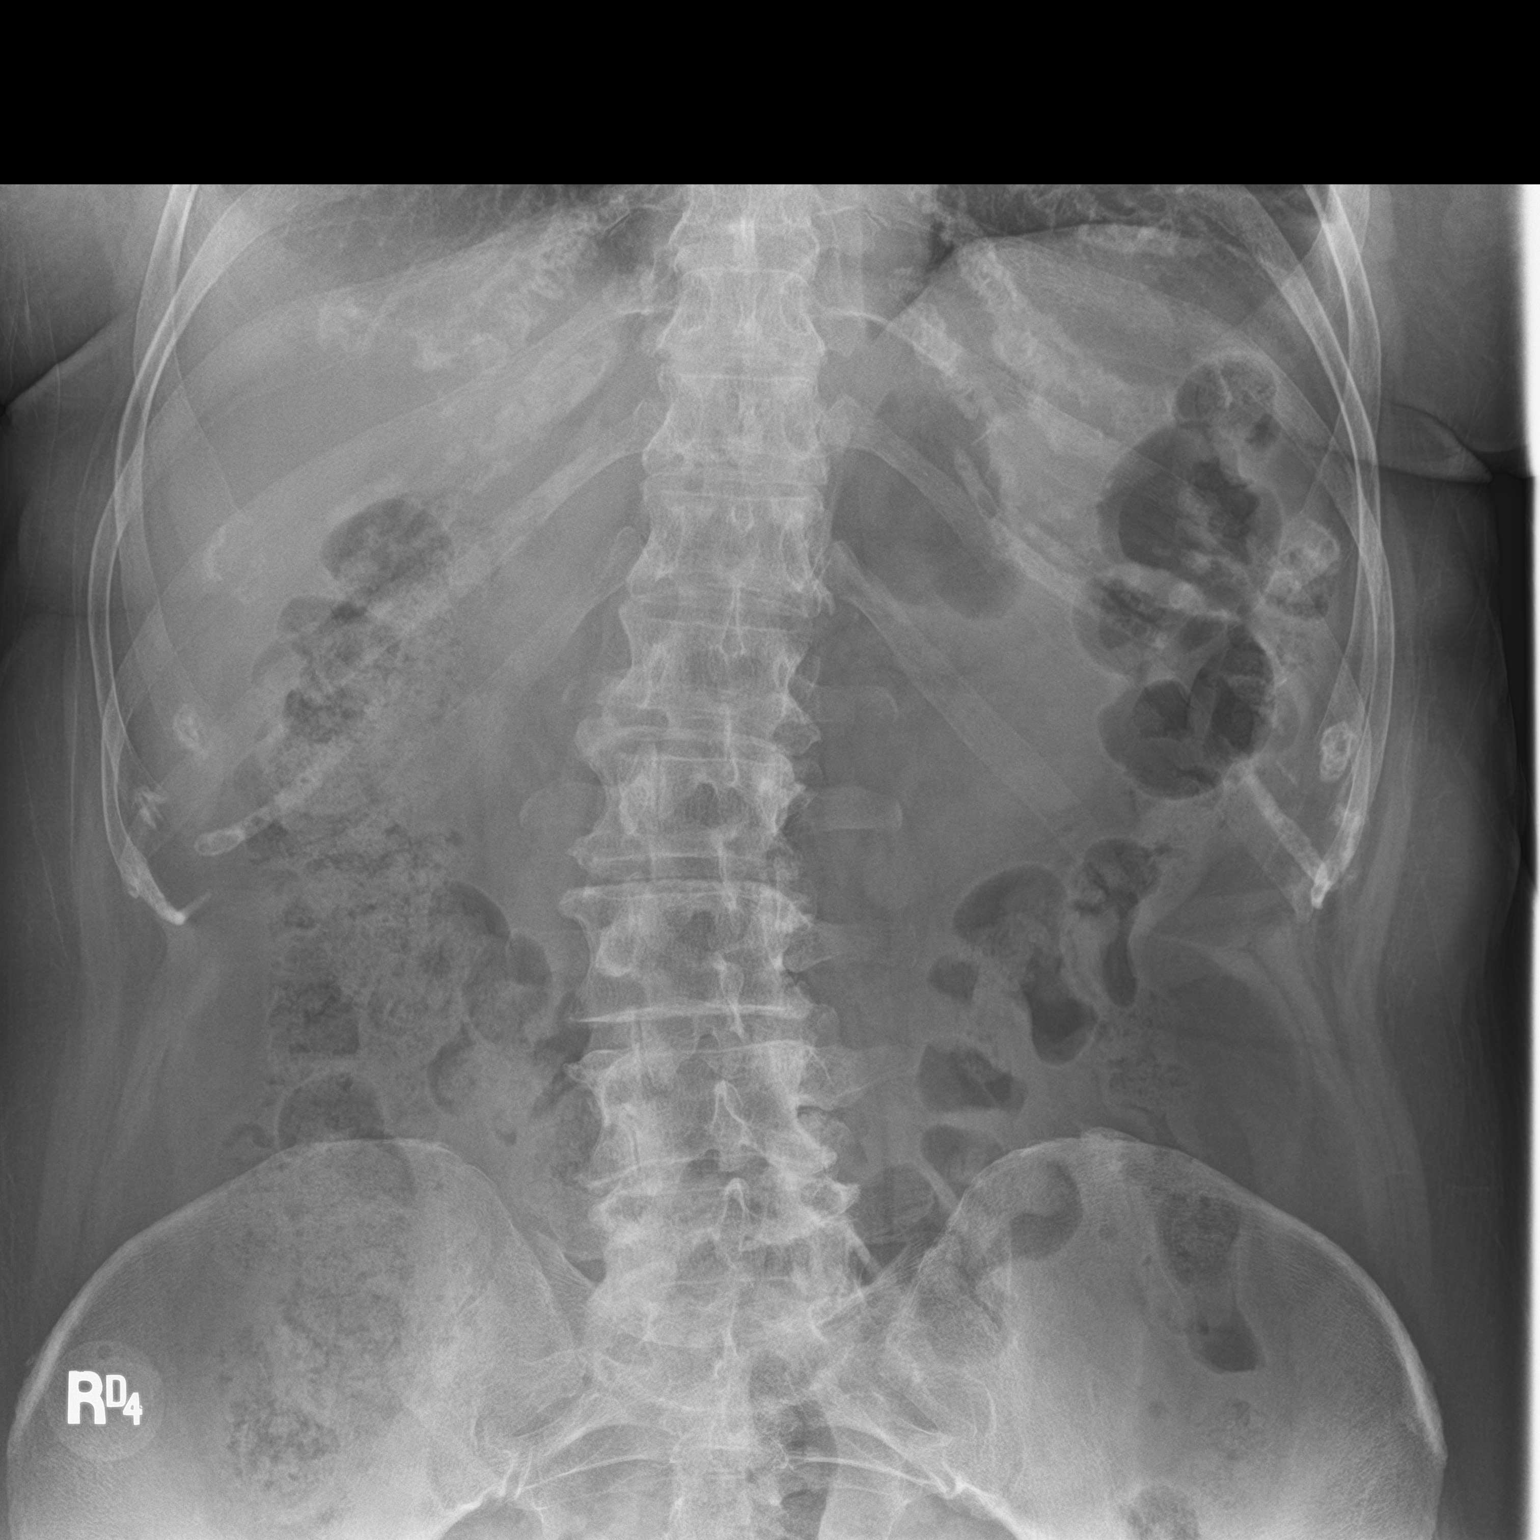

[2 of 2 positions shown; findings below may reference images not displayed]

FINDINGS: Scattered large and small bowel gas is noted. Mild retained fecal
material is noted without obstructive change. No free air is noted.
No acute bony abnormality is seen.
IMPRESSION: Mild retained fecal material.

## 2020-12-18 ENCOUNTER — Other Ambulatory Visit: Payer: Self-pay | Admitting: Family Medicine

## 2020-12-18 DIAGNOSIS — E7800 Pure hypercholesterolemia, unspecified: Secondary | ICD-10-CM

## 2021-01-04 ENCOUNTER — Encounter: Payer: Self-pay | Admitting: Family Medicine

## 2021-01-04 ENCOUNTER — Ambulatory Visit (INDEPENDENT_AMBULATORY_CARE_PROVIDER_SITE_OTHER): Payer: PPO | Admitting: Family Medicine

## 2021-01-04 VITALS — BP 124/76 | HR 72 | Temp 98.3°F | Resp 16 | Ht 67.0 in | Wt 186.0 lb

## 2021-01-04 DIAGNOSIS — N1831 Chronic kidney disease, stage 3a: Secondary | ICD-10-CM

## 2021-01-04 DIAGNOSIS — M545 Low back pain, unspecified: Secondary | ICD-10-CM

## 2021-01-04 DIAGNOSIS — M810 Age-related osteoporosis without current pathological fracture: Secondary | ICD-10-CM | POA: Diagnosis not present

## 2021-01-04 DIAGNOSIS — I4729 Other ventricular tachycardia: Secondary | ICD-10-CM

## 2021-01-04 DIAGNOSIS — M533 Sacrococcygeal disorders, not elsewhere classified: Secondary | ICD-10-CM

## 2021-01-04 DIAGNOSIS — K219 Gastro-esophageal reflux disease without esophagitis: Secondary | ICD-10-CM | POA: Diagnosis not present

## 2021-01-04 DIAGNOSIS — E7800 Pure hypercholesterolemia, unspecified: Secondary | ICD-10-CM

## 2021-01-04 DIAGNOSIS — Z1231 Encounter for screening mammogram for malignant neoplasm of breast: Secondary | ICD-10-CM | POA: Diagnosis not present

## 2021-01-04 DIAGNOSIS — K5909 Other constipation: Secondary | ICD-10-CM

## 2021-01-04 DIAGNOSIS — F5101 Primary insomnia: Secondary | ICD-10-CM

## 2021-01-04 DIAGNOSIS — Z23 Encounter for immunization: Secondary | ICD-10-CM | POA: Diagnosis not present

## 2021-01-04 MED ORDER — QUETIAPINE FUMARATE 25 MG PO TABS: 25.0000 mg | ORAL_TABLET | Freq: Every day | ORAL | 1 refills | Status: DC

## 2021-01-04 NOTE — Progress Notes (Signed)
Name: Jacqueline Kaiser   MRN: [id number]    DOB: 1945/07/02   Date:01/04/2021       Progress Note  Subjective  Chief Complaint  Chief Complaint  Patient presents with   Follow-up    HPI  Osteoporosis history of vertebral compression fracture: she  went to [facility], vitamin D and basic metabolic panel was normal. She is still getting Prolia with Dr. Gabriel Carina and denies side effects of medication.  Last dose was 10/2020, goes twice a year, last vitamin D was normal    Chronic lumbar spine pain: she states it has been going on for years, however fall of 2018 pain was severe and did not improve with chiropractor care, she was sent to [place] and found to have radiculitis. She is doing well without medication at this time. She states pain now is intermittent , not necessarily when she is at work. Worse when standing still. Usually aching and lumbar spine. Stable and unchanged . She is no longer taking Aleve due to GI symptoms, also states not good for her kidney    GERD: she is taking Omeprazole prn only, she takes it less than once a week. She stopped taking Aleve as often and is doing well  CKI : stage III, we will recheck labs, avoid NSAID's and drink more water    NSVT: she states diagnosed years ago by a cardiologist, no chest pain or palpation, she denies any symptoms.    Hyperlipidemia: taking Crestor, denies myalgia, chest pain or palpitation. Last LDL was 114, she wants to continue current dose and resume physical activity . I will recheck it today    Insomnia: she states she falls asleep without problems, but she was waking up  in the middle of the night but since started on Seroquel she is able to go back to bed.   Urinary incontinence/Constipation : she was doing well, but recently went to see Urologist and has tried Myrbetriq and ditropan without much help , she stopped taking it.  She states since taking Miralax daily constipation has been better control, has a bowel  movement daily and Bristol scale of III  Coccyalgia: she noticed tail bone pain, constant for the past couple months, she has tried leaning forwards and shifting positions, but is not helping. She can pinpoint the spot. No recent falls.    Patient Active Problem List   Diagnosis Date Noted   Cystocele with prolapse 06/19/2018   Age-related osteoporosis without current pathological fracture 01/08/2018   History of vertebral compression fracture 05/23/2017   Incomplete bladder emptying 05/16/2016   GERD (gastroesophageal reflux disease) 02/14/2016   Insomnia due to anxiety and fear 02/14/2016   Allergy 07/01/2013   Osteoporosis of lumbar spine 05/19/2013   Overweight (BMI 25.0-29.9) 08/28/2011   Peripheral vertigo 08/28/2011   Nonsustained ventricular tachycardia    Hyperlipidemia     Past Surgical History:  Procedure Laterality Date   ABDOMINAL HYSTERECTOMY     BREAST BIOPSY Right    negative 10-12 years ago- core   CESAREAN SECTION     COLONOSCOPY  2012   CYSTOCELE REPAIR N/A 06/19/2018   Procedure: CYSTOSCOPY ANTERIOR REPAIR (CYSTOCELE);  Surgeon: Bjorn Loser, MD;  Location: [facility];  Service: Urology;  Laterality: N/A;   TONSILLECTOMY      Family History  Problem Relation Age of Onset   Heart disease Mother        ATRIAL FIB   Cancer Father 24  Lung Ca,  nonsmoker, metastatic at diagnosis  asbestos exposure   Breast cancer Daughter 84       dx at age of 74 and 27    Social History   Tobacco Use   Smoking status: Former    Packs/day: 0.50    Years: 10.00    Pack years: 5.00    Types: Cigarettes    Quit date: 1980    Years since quitting: 42.7   Smokeless tobacco: Never   Tobacco comments:    smoking cessation materials not required  Substance Use Topics   Alcohol use: Yes    Comment: wine occasionally     Current Outpatient Medications:    Ascorbic Acid (VITAMIN C) 100 MG tablet, Take 100 mg by mouth daily., Disp: , Rfl:    calcium carbonate  (OS-CAL) 600 MG TABS tablet, Take by mouth., Disp: , Rfl:    cholecalciferol (VITAMIN D3) 25 MCG (1000 UT) tablet, Take 1,000 Units by mouth daily., Disp: , Rfl:    denosumab (PROLIA) 60 MG/ML SOSY injection, Inject 1 mL into the skin every 6 (six) months., Disp: , Rfl:    meclizine (ANTIVERT) 12.5 MG tablet, Take 1 tablet (12.5 mg total) by mouth 4 (four) times daily as needed for dizziness., Disp: 30 tablet, Rfl: 0   omeprazole (PRILOSEC) 20 MG capsule, Take 20 mg by mouth daily., Disp: , Rfl:    QUEtiapine (SEROQUEL) 25 MG tablet, Take 1 tablet (25 mg total) by mouth at bedtime., Disp: 90 tablet, Rfl: 1   rosuvastatin (CRESTOR) 20 MG tablet, TAKE 1 TABLET BY MOUTH EVERY EVENING., Disp: 90 tablet, Rfl: 1   vitamin B-12 (CYANOCOBALAMIN) 500 MCG tablet, Take 500 mcg by mouth daily., Disp: , Rfl:    Biotin 1 MG CAPS, Take by mouth. (Patient not taking: Reported on 01/04/2021), Disp: , Rfl:   Allergies  Allergen Reactions   Betadine [Povidone Iodine] Other (See Comments)    Burns   Iodine     I personally reviewed active problem list, medication list, allergies, family history, social history, health maintenance with the patient/caregiver today.   ROS  Constitutional: Negative for fever or weight change.  Respiratory: Negative for cough and shortness of breath.   Cardiovascular: Negative for chest pain or palpitations.  Gastrointestinal: Negative for abdominal pain, no bowel changes.  Musculoskeletal: Negative for gait problem or joint swelling.  Skin: Negative for rash.  Neurological: Negative for dizziness or headache.  No other specific complaints in a complete review of systems (except as listed in HPI above).   Objective   Vitals:   01/04/21 0930  BP: 124/76  Pulse: 72  Resp: 16  Temp: 98.3 F (36.8 C)  SpO2: 98%  Weight: 186 lb (84.4 kg)  Height: 5\' 7"  (1.702 m)    Body mass index is 29.13 kg/m.  Physical Exam  Constitutional: Patient appears well-developed and  well-nourished. No distress.  HEENT: head atraumatic, normocephalic, pupils equal and reactive to light, neck supple Cardiovascular: Normal rate, regular rhythm and normal heart sounds.  No murmur heard. No BLE edema. Pulmonary/Chest: Effort normal and breath sounds normal. No respiratory distress. Abdominal: Soft.  There is no tenderness. Psychiatric: Patient has a normal mood and affect. behavior is normal. Judgment and thought content normal.  Muscular skeletal: crepitus with extension of both knees, tender coccyx during palpation   PHQ2/9: Depression screen [facility] 2/9 01/04/2021 07/03/2020 01/09/2020 12/04/2019 06/03/2019  Decreased Interest 0 0 0 0 0  Down, Depressed, Hopeless 0 0  0 0 0  PHQ - 2 Score 0 0 0 0 0  Altered sleeping - - - - 0  Tired, decreased energy - - - - 0  Change in appetite - - - - 0  Feeling bad or failure about yourself  - - - - 0  Trouble concentrating - - - - 0  Moving slowly or fidgety/restless - - - - 0  Suicidal thoughts - - - - 0  PHQ-9 Score - - - - 0  Difficult doing work/chores - - - - -  Some recent data might be hidden    phq 9 is negative   Fall Risk: Fall Risk  01/04/2021 07/03/2020 01/09/2020 12/04/2019 06/03/2019  Falls in the past year? 0 0 0 0 0  Number falls in past yr: 0 0 0 0 0  Comment - - - - -  Injury with Fall? 0 0 0 0 0  Comment - - - - -  Risk Factor Category  - - - - -  Comment - - - - -  Risk for fall due to : No Fall Risks - No Fall Risks - -  Risk for fall due to: Comment - - - - -  Follow up Falls prevention discussed - Falls prevention discussed - -    Assessment & Plan  1. Pure hypercholesterolemia  - Lipid panel  2. Age-related osteoporosis without current pathological fracture   3. Chronic constipation   4. Primary insomnia  - QUEtiapine (SEROQUEL) 25 MG tablet; Take 1 tablet (25 mg total) by mouth at bedtime.  Dispense: 90 tablet; Refill: 1  5. Gastroesophageal reflux disease without esophagitis   6. Intermittent  low back pain   7. Stage 3a chronic kidney disease (HCC)  - CBC with Differential/Platelet - COMPLETE METABOLIC PANEL WITH GFR  8. Coccyalgia  - Ambulatory referral to Orthopedic Surgery  9. Nonsustained ventricular tachycardia   10. Breast cancer screening by mammogram  - MM 3D SCREEN BREAST BILATERAL; Future  11. Needs flu shot  - Flu Vaccine QUAD High Dose(Fluad)

## 2021-01-05 LAB — COMPLETE METABOLIC PANEL WITH GFR
AG Ratio: 2.3 (calc) (ref 1.0–2.5)
ALT: 14 U/L (ref 6–29)
AST: 17 U/L (ref 10–35)
Albumin: 4.4 g/dL (ref 3.6–5.1)
Alkaline phosphatase (APISO): 53 U/L (ref 37–153)
BUN: 22 mg/dL (ref 7–25)
CO2: 29 mmol/L (ref 20–32)
Calcium: 9.4 mg/dL (ref 8.6–10.4)
Chloride: 104 mmol/L (ref 98–110)
Creat: 0.83 mg/dL (ref 0.60–1.00)
Globulin: 1.9 g/dL (calc) (ref 1.9–3.7)
Glucose, Bld: 95 mg/dL (ref 65–99)
Potassium: 4.9 mmol/L (ref 3.5–5.3)
Sodium: 140 mmol/L (ref 135–146)
Total Bilirubin: 0.4 mg/dL (ref 0.2–1.2)
Total Protein: 6.3 g/dL (ref 6.1–8.1)
eGFR: 73 mL/min/{1.73_m2} (ref >=60)

## 2021-01-05 LAB — CBC WITH DIFFERENTIAL/PLATELET
Absolute Monocytes: 402 cells/uL (ref 200–950)
Basophils Absolute: 29 cells/uL (ref 0–200)
Basophils Relative: 0.6 %
Eosinophils Absolute: 142 cells/uL (ref 15–500)
Eosinophils Relative: 2.9 %
HCT: 43.9 % (ref 35.0–45.0)
Hemoglobin: 14.3 g/dL (ref 11.7–15.5)
Lymphs Abs: 1563 cells/uL (ref 850–3900)
MCH: 30.4 pg (ref 27.0–33.0)
MCHC: 32.6 g/dL (ref 32.0–36.0)
MCV: 93.2 fL (ref 80.0–100.0)
MPV: 9.1 fL (ref 7.5–12.5)
Monocytes Relative: 8.2 %
Neutro Abs: 2764 cells/uL (ref 1500–7800)
Neutrophils Relative %: 56.4 %
Platelets: 216 10*3/uL (ref 140–400)
RBC: 4.71 10*6/uL (ref 3.80–5.10)
RDW: 11.9 % (ref 11.0–15.0)
Total Lymphocyte: 31.9 %
WBC: 4.9 10*3/uL (ref 3.8–10.8)

## 2021-01-05 LAB — LIPID PANEL
Cholesterol: 217 mg/dL — ABNORMAL HIGH (ref <200)
HDL: 69 mg/dL (ref >=50)
LDL Cholesterol (Calc): 123 mg/dL (calc) — ABNORMAL HIGH
Non-HDL Cholesterol (Calc): 148 mg/dL (calc) — ABNORMAL HIGH (ref <130)
Total CHOL/HDL Ratio: 3.1 (calc) (ref <5.0)
Triglycerides: 135 mg/dL (ref <150)

## 2021-01-07 ENCOUNTER — Ambulatory Visit: Payer: PPO | Admitting: Surgery

## 2021-01-07 ENCOUNTER — Other Ambulatory Visit: Payer: Self-pay

## 2021-01-07 ENCOUNTER — Ambulatory Visit: Payer: Self-pay

## 2021-01-07 ENCOUNTER — Encounter: Payer: Self-pay | Admitting: Surgery

## 2021-01-07 VITALS — BP 113/75 | HR 65

## 2021-01-07 DIAGNOSIS — M533 Sacrococcygeal disorders, not elsewhere classified: Secondary | ICD-10-CM

## 2021-01-07 DIAGNOSIS — M47816 Spondylosis without myelopathy or radiculopathy, lumbar region: Secondary | ICD-10-CM

## 2021-01-07 NOTE — Progress Notes (Signed)
Office Visit Note   Patient: Jacqueline Kaiser           Date of Birth: 01-Aug-1945           MRN: [id number] Visit Date: 01/07/2021              Requested by: Steele Sizer, [address] [place],  [place] [id number] PCP: Steele Sizer, MD   Assessment & Plan: Visit Diagnoses:  1. Coccyalgia   2. Spondylosis without myelopathy or radiculopathy, lumbar region   Question pilonidal cyst formation  Plan: Advised patient that I recommend that she return back to see her primary care provider to have proper evaluation to rule out pilonidal cyst.  It does not appear that patient was evaluated for this during her visit with them.  I advised patient and her husband who was present that if I have questions about this diagnosis that I do not personally exposed patients here in the office to look for that.  He voiced understanding.  Office follow-up with Dr. Lorin Mercy in 4 weeks for recheck and hopefully she has seen her PCP during that time to get their input.  All questions answered.  Follow-Up Instructions: Return in about 4 weeks (around 02/04/2021) for with dr yates recheck coccyx pain. .   Orders:  Orders Placed This Encounter  Procedures   XR Sacrum/Coccyx   XR Lumbar Spine 2-3 Views   No orders of the defined types were placed in this encounter.     Procedures: No procedures performed   Clinical Data: No additional findings.   Subjective: Chief Complaint  Patient presents with   Pelvis - Pain    HPI 75 year old white female who is new patient to clinic comes in today with complaints of tailbone pain.  States that this is been ongoing for about a month.  No injury or previous problems for onset.  Pain aggravated when she is sitting.  She was seen by her primary care provider for this and referred to our office for evaluation.  I asked patient what she fully examine by the PCP down in the area where she is complaining of pain and states that area was palpated over  clothing but not fully visually inspected.  She had some back issues in the past but this has been doing very well the last several years.  Not currently complaining of any low back pain or any lower extremity radiculopathy. Review of Systems No current cardiac pulmonary  GU issues.  Patient admits to some issues with constipation.  Objective: Vital Signs: BP 113/75   Pulse 65   Physical Exam HENT:     Nose: Nose normal.  Pulmonary:     Effort: Pulmonary effort is normal.  Musculoskeletal:     Comments: Gait is normal.  With this being an orthopedic office I did not have patient does to fully visualize the area of pain.  Palpation of the clothing she is tender over the sacrococcygeal area.  No lumbar paraspinal tenderness.  Bilateral SI joints nontender.  No sciatic notch tenderness.  Negative logroll bilateral hips.  Negative straight leg raise.  Neurological:     General: No focal deficit present.     Mental Status: She is alert and oriented to person, place, and time.  Psychiatric:        Mood and Affect: Mood normal.    Ortho Exam  Specialty Comments:  No specialty comments available.  Imaging: No results found.   PMFS History:  Patient Active Problem List   Diagnosis Date Noted   Cystocele with prolapse 06/19/2018   Age-related osteoporosis without current pathological fracture 01/08/2018   History of vertebral compression fracture 05/23/2017   Incomplete bladder emptying 05/16/2016   GERD (gastroesophageal reflux disease) 02/14/2016   Insomnia due to anxiety and fear 02/14/2016   Allergy 07/01/2013   Osteoporosis of lumbar spine 05/19/2013   Overweight (BMI 25.0-29.9) 08/28/2011   Peripheral vertigo 08/28/2011   Nonsustained ventricular tachycardia    Hyperlipidemia    Past Medical History:  Diagnosis Date   Allergy    Ankle fracture 2014   Left   Anxiety    Female cystocele    Grade 2   Fracture    lower back after sneezing   GERD (gastroesophageal  reflux disease)    History of   History of vertigo    last attack about 1 week ago   Hyperlipidemia    Insomnia    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   Right, .4 x 1.3 cm, noted on [place] ABD 05/2016, left lobe of the liver with the largest measuring 9.9 mm in diameter [place] ABD 06/2010   Low back sprain    Nonsustained ventricular tachycardia    by Holter,  noraml Stress test ECHO perpatient [name], [place])   Obese    Osteoporosis    Sinusitis    Urinary frequency    Wrist fracture    Left    Family History  Problem Relation Age of Onset   Heart disease Mother        ATRIAL FIB   Cancer Father 42       Lung Ca,  nonsmoker, metastatic at diagnosis  asbestos exposure   Breast cancer Daughter 58       dx at age of 28 and 54    Past Surgical History:  Procedure Laterality Date   ABDOMINAL HYSTERECTOMY     BREAST BIOPSY Right    negative 10-12 years ago- core   CESAREAN SECTION     COLONOSCOPY  2012   CYSTOCELE REPAIR N/A 06/19/2018   Procedure: CYSTOSCOPY ANTERIOR REPAIR (CYSTOCELE);  Surgeon: Bjorn Loser, MD;  Location: [facility];  Service: Urology;  Laterality: N/A;   TONSILLECTOMY     Social History   Occupational History   Occupation: Retired  Tobacco Use   Smoking status: Former    Packs/day: 0.50    Years: 10.00    Pack years: 5.00    Types: Cigarettes    Quit date: 1980    Years since quitting: 42.7   Smokeless tobacco: Never   Tobacco comments:    smoking cessation materials not required  Vaping Use   Vaping Use: Never used  Substance and Sexual Activity   Alcohol use: Yes    Comment: wine occasionally   Drug use: No   Sexual activity: Not Currently    Birth control/protection: Surgical

## 2021-01-08 DIAGNOSIS — D2361 Other benign neoplasm of skin of right upper limb, including shoulder: Secondary | ICD-10-CM | POA: Diagnosis not present

## 2021-01-08 DIAGNOSIS — L821 Other seborrheic keratosis: Secondary | ICD-10-CM | POA: Diagnosis not present

## 2021-01-08 DIAGNOSIS — L728 Other follicular cysts of the skin and subcutaneous tissue: Secondary | ICD-10-CM | POA: Diagnosis not present

## 2021-01-11 ENCOUNTER — Other Ambulatory Visit: Payer: Self-pay

## 2021-01-11 ENCOUNTER — Ambulatory Visit (INDEPENDENT_AMBULATORY_CARE_PROVIDER_SITE_OTHER): Payer: PPO | Admitting: Family Medicine

## 2021-01-11 VITALS — BP 108/62 | HR 75 | Temp 97.4°F | Resp 16 | Ht 67.0 in | Wt 185.2 lb

## 2021-01-11 DIAGNOSIS — M533 Sacrococcygeal disorders, not elsewhere classified: Secondary | ICD-10-CM

## 2021-01-11 NOTE — Assessment & Plan Note (Signed)
Unclear etiology. No overlying skin or soft tissue changes on exam, bedside US or prior XR to concern for infection. No h/o trauma, unclear if coccyx fracture on my read of XR though no formal read and would continue to treat conservatively regardless at this time. Recommend donut cushion and OTC pain relief if needed. Maintain f/u with ortho.

## 2021-01-11 NOTE — Progress Notes (Signed)
   SUBJECTIVE:   CHIEF COMPLAINT / HPI:   Coccyalgia - has h/o lumbar spondylosis without radiculopathy - tail bone pain xfew months. Constant.  - no falls, trauma reported - saw Ortho 10/6 with XR, no final read available. Was told to see PCP for eval of pilonidal cyst. No h/o cysts in the past. No fevers, redness, swelling. - pain located mainly over coccyx.  - taking miralax for constipation.  - hasn't had to take anything for pain.  - on feet a lot at work, unclear if changing positions helps.  OBJECTIVE:   BP 108/62   Pulse 75   Temp (!) 97.4 F (36.3 C) (Oral)   Resp 16   Ht 5\' 7"  (1.702 m)   Wt 185 lb 3.2 oz (84 kg)   SpO2 96%   BMI 29.01 kg/m   Gen: well appearing, in NAD MSK: Lumbar/Sacral: no asymmetry, no midline spinal tenderness of lumbar spine or sacrum. No tenderness over b/l SI joint. Point tenderness over coccyx. No redness, swelling, fluctuance, induration.  Ext: WWP, no edema  Bedside US: no underlying sacral or pilonidal cyst.   ASSESSMENT/PLAN:   Coccyalgia Unclear etiology. No overlying skin or soft tissue changes on exam, bedside US or prior XR to concern for infection. No h/o trauma, unclear if coccyx fracture on my read of XR though no formal read and would continue to treat conservatively regardless at this time. Recommend donut cushion and OTC pain relief if needed. Maintain f/u with ortho.     Myles Gip, DO

## 2021-01-12 ENCOUNTER — Ambulatory Visit (INDEPENDENT_AMBULATORY_CARE_PROVIDER_SITE_OTHER): Payer: PPO

## 2021-01-12 VITALS — BP 122/78 | HR 80 | Temp 97.7°F | Resp 16 | Ht 67.0 in | Wt 185.0 lb

## 2021-01-12 DIAGNOSIS — Z0000 Encounter for general adult medical examination without abnormal findings: Secondary | ICD-10-CM

## 2021-01-12 NOTE — Progress Notes (Signed)
Subjective:   Jacqueline Kaiser is a 75 y.o. female who presents for Medicare Annual (Subsequent) preventive examination.  Review of Systems     Cardiac Risk Factors include: advanced age (>60men, >22 women);dyslipidemia     Objective:    Today's Vitals   01/12/21 0844  BP: 122/78  Pulse: 80  Resp: 16  Temp: 97.7 F (36.5 C)  TempSrc: Oral  SpO2: 95%  Weight: 185 lb (83.9 kg)  Height: 5\' 7"  (1.702 m)   Body mass index is 28.98 kg/m.  Advanced Directives 01/12/2021 01/09/2020 01/08/2019 06/19/2018 06/15/2018 01/04/2018 03/21/2016  Does Patient Have a Medical Advance Directive? Yes Yes Yes Yes Yes Yes Yes  Type of Paramedic of [place];Living will [place];Living will [place];Living will [place];Living will [place];Living will [place];Living will [place];Living will  Does patient want to make changes to medical advance directive? - - - No - Patient declined No - Patient declined - No - Patient declined  Copy of [place] in Chart? Yes - validated most recent copy scanned in chart (See row information) Yes - validated most recent copy scanned in chart (See row information) Yes - validated most recent copy scanned in chart (See row information) No - copy requested No - copy requested No - copy requested Yes    Current Medications (verified) Outpatient Encounter Medications as of 01/12/2021  Medication Sig   Ascorbic Acid (VITAMIN C) 100 MG tablet Take 100 mg by mouth daily.   calcium carbonate (OS-CAL) 600 MG TABS tablet Take by mouth.   cholecalciferol (VITAMIN D3) 25 MCG (1000 UT) tablet Take 1,000 Units by mouth daily.   denosumab (PROLIA) 60 MG/ML SOSY injection Inject 1 mL into the skin every 6 (six) months.   meclizine (ANTIVERT) 12.5 MG tablet Take 1 tablet (12.5 mg total) by mouth 4 (four) times daily as  needed for dizziness.   omeprazole (PRILOSEC) 20 MG capsule Take 20 mg by mouth daily.   polyethylene glycol powder (GLYCOLAX/MIRALAX) 17 GM/SCOOP powder Take 17 g by mouth once. PRN   QUEtiapine (SEROQUEL) 25 MG tablet Take 1 tablet (25 mg total) by mouth at bedtime.   rosuvastatin (CRESTOR) 20 MG tablet TAKE 1 TABLET BY MOUTH EVERY EVENING.   vitamin B-12 (CYANOCOBALAMIN) 500 MCG tablet Take 500 mcg by mouth daily.   No facility-administered encounter medications on file as of 01/12/2021.    Allergies (verified) Betadine [povidone iodine] and Iodine   History: Past Medical History:  Diagnosis Date   Allergy    Ankle fracture 2014   Left   Anxiety    Female cystocele    Grade 2   Fracture    lower back after sneezing   GERD (gastroesophageal reflux disease)    History of   History of vertigo    last attack about 1 week ago   Hyperlipidemia    Insomnia    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   Right, .4 x 1.3 cm, noted on [place] ABD 05/2016, left lobe of the liver with the largest measuring 9.9 mm in diameter [place] ABD 06/2010   Low back sprain    Nonsustained ventricular tachycardia    by Holter,  noraml Stress test ECHO perpatient [name], [place])   Obese    Osteoporosis    Sinusitis    Urinary frequency    Wrist fracture    Left   Past Surgical  History:  Procedure Laterality Date   ABDOMINAL HYSTERECTOMY     BREAST BIOPSY Right    negative 10-12 years ago- core   CESAREAN SECTION     COLONOSCOPY  2012   CYSTOCELE REPAIR N/A 06/19/2018   Procedure: CYSTOSCOPY ANTERIOR REPAIR (CYSTOCELE);  Surgeon: Bjorn Loser, MD;  Location: [facility];  Service: Urology;  Laterality: N/A;   TONSILLECTOMY     Family History  Problem Relation Age of Onset   Heart disease Mother        ATRIAL FIB   Cancer Father 30       Lung Ca,  nonsmoker, metastatic at diagnosis  asbestos exposure   Breast cancer Daughter 71       dx at age of 29 and 73   Social History   Socioeconomic History   Marital  status: Married    Spouse name: Tim   Number of children: 2   Years of education: Not on file   Highest education level: 12th grade  Occupational History   Occupation: Retired  Tobacco Use   Smoking status: Former    Packs/day: 0.50    Years: 10.00    Pack years: 5.00    Types: Cigarettes    Quit date: 04/04/1978    Years since quitting: 42.8   Smokeless tobacco: Never   Tobacco comments:    smoking cessation materials not required  Vaping Use   Vaping Use: Never used  Substance and Sexual Activity   Alcohol use: Yes    Comment: wine occasionally   Drug use: No   Sexual activity: Not Currently    Birth control/protection: Surgical  Other Topics Concern   Not on file  Social History Narrative   Works part time at [employer] broadcast assistant Strain: Low Risk    Difficulty of Paying Living Expenses: Not hard at all  Food Insecurity: No Food Insecurity   Worried About Charity fundraiser in the Last Year: Never true   Arboriculturist in the Last Year: Never true  Transportation Needs: No Transportation Needs   Lack of Transportation (Medical): No   Lack of Transportation (Non-Medical): No  Physical Activity: Insufficiently Active   Days of Exercise per Week: 2 days   Minutes of Exercise per Session: 30 min  Stress: No Stress Concern Present   Feeling of Stress : Not at all  Social Connections: Moderately Integrated   Frequency of Communication with Friends and Family: More than three times a week   Frequency of Social Gatherings with Friends and Family: More than three times a week   Attends Religious Services: More than 4 times per year   Active Member of [employer] or Organizations: No   Attends Music therapist: Never   Marital Status: Married    Tobacco Counseling Counseling given: Not Answered Tobacco comments: smoking cessation materials not required   Clinical Intake:  Pre-visit preparation completed: Yes  Pain  : No/denies pain     BMI - recorded: 28.98 Nutritional Status: BMI 25 -29 Overweight Nutritional Risks: None Diabetes: No  How often do you need to have someone help you when you read instructions, pamphlets, or other written materials from your doctor or pharmacy?: 1 - Never    Interpreter Needed?: No  Information entered by :: Clemetine Marker LPN   Activities of Daily Living In your present state of health, do you have any difficulty performing the following activities: 01/12/2021 01/11/2021  Hearing? N N  Vision? N N  Difficulty concentrating or making decisions? N N  Walking or climbing stairs? N N  Dressing or bathing? N N  Doing errands, shopping? N N  Preparing Food and eating ? N -  Using the Toilet? N -  In the past six months, have you accidently leaked urine? N -  Do you have problems with loss of bowel control? N -  Managing your Medications? N -  Managing your Finances? N -  Housekeeping or managing your Housekeeping? N -  Some recent data might be hidden    Patient Care Team: Steele Sizer, MD as PCP - General (Family Medicine) Gabriel Carina, Betsey Holiday, MD as Physician Assistant (Endocrinology)  Indicate any recent Medical Services you may have received from other than [facility] providers in the past year (date may be approximate).     Assessment:   This is a routine wellness examination for [facility].  Hearing/Vision screen Hearing Screening - Comments::  Pt denies hearing difficulty  Vision Screening - Comments:: Annual vision screenings done at [facility]; due for exam  Dietary issues and exercise activities discussed: Current Exercise Habits: The patient has a physically strenuous job, but has no regular exercise apart from work., Exercise limited by: None identified   Goals Addressed             This Visit's Progress    DIET - INCREASE WATER INTAKE   On track    Recommend to drink at least 6-8 8oz glasses of water per day.     Increase physical activity   Not  on track    Water aerobic classes 3 times a week       Depression Screen PHQ 2/9 Scores 01/12/2021 01/11/2021 01/04/2021 07/03/2020 01/09/2020 12/04/2019 06/03/2019  PHQ - 2 Score 0 0 0 0 0 0 0  PHQ- 9 Score 0 0 - - - - 0    Fall Risk Fall Risk  01/12/2021 01/11/2021 01/04/2021 07/03/2020 01/09/2020  Falls in the past year? 0 0 0 0 0  Number falls in past yr: 0 0 0 0 0  Comment - - - - -  Injury with Fall? 0 0 0 0 0  Comment - - - - -  Risk Factor Category  - - - - -  Comment - - - - -  Risk for fall due to : No Fall Risks - No Fall Risks - No Fall Risks  Risk for fall due to: Comment - - - - -  Follow up Falls prevention discussed - Falls prevention discussed - Falls prevention discussed    FALL RISK PREVENTION PERTAINING TO THE HOME:  Any stairs in or around the home? Yes  If so, are there any without handrails? No  Home free of loose throw rugs in walkways, pet beds, electrical cords, etc? Yes  Adequate lighting in your home to reduce risk of falls? Yes   ASSISTIVE DEVICES UTILIZED TO PREVENT FALLS:  Life alert? No  Use of a cane, walker or w/c? No  Grab bars in the bathroom? No  Shower chair or bench in shower? No  Elevated toilet seat or a handicapped toilet? No   TIMED UP AND GO:  Was the test performed? Yes .  Length of time to ambulate 10 feet: 5 sec.   Gait steady and fast without use of assistive device  Cognitive Function: Normal cognitive status assessed by direct observation by this Nurse Health Advisor. No abnormalities found.  6CIT Screen 01/08/2019 01/04/2018 03/21/2016  What Year? 0 points 0 points 0 points  What month? 0 points 0 points 0 points  What time? 0 points 0 points 0 points  Count back from 20 0 points 0 points 0 points  Months in reverse 0 points 0 points 0 points  Repeat phrase 4 points 4 points -  Total Score 4 4 -    Immunizations Immunization History  Administered Date(s) Administered   Fluad Quad(high Dose 65+) 12/03/2018,  12/04/2019, 01/04/2021   Influenza Split 01/06/2012, 12/15/2012   Influenza-Unspecified 12/04/2015, 12/18/2016, 12/29/2017   PFIZER(Purple Top)SARS-COV-2 Vaccination 04/29/2019, 05/20/2019, 03/03/2020, 07/03/2020   Pneumococcal Conjugate-13 12/15/2014   Pneumococcal Polysaccharide-23 12/15/2007, 08/30/2017   Tdap 10/13/2013, 10/30/2018   Zoster Recombinat (Shingrix) 10/30/2018    TDAP status: Up to date  Flu Vaccine status: Up to date  Pneumococcal vaccine status: Up to date  Covid-19 vaccine status: Completed vaccines  Qualifies for Shingles Vaccine? Yes   Zostavax completed No   Shingrix Completed?: Yes - will request 2nd dose information from [facility]  Topic Date Due   Zoster Vaccines- Shingrix (2 of 2) 12/25/2018   MAMMOGRAM  02/24/2021   TETANUS/TDAP  10/29/2028   INFLUENZA VACCINE  Completed   DEXA SCAN  Completed   COVID-19 Vaccine  Completed   Hepatitis C Screening  Completed   HPV VACCINES  Aged Out    Health Maintenance  Health Maintenance Due  Topic Date Due   Zoster Vaccines- Shingrix (2 of 2) 12/25/2018    Colorectal cancer screening: No longer required.   Mammogram status: Completed 02/25/20. Repeat every year  Bone Density status: Completed 02/25/20. Results reflect: Bone density results: OSTEOPOROSIS. Repeat every 2 years.  Lung Cancer Screening: (Low Dose CT Chest recommended if Age 16-80 years, 30 pack-year currently smoking OR have quit w/in 15years.) does not qualify.   Additional Screening:  Hepatitis C Screening: does qualify; Completed 12/15/14  Vision Screening: Recommended annual ophthalmology exams for early detection of glaucoma and other disorders of the eye. Is the patient up to date with their annual eye exam?  No  Who is the provider or what is the name of the office in which the patient attends annual eye exams? [facility]  Dental Screening: Recommended annual dental exams for proper oral  hygiene  Community Resource Referral / Chronic Care Management: CRR required this visit?  No   CCM required this visit?  No      Plan:     I have personally reviewed and noted the following in the patient's chart:   Medical and social history Use of alcohol, tobacco or illicit drugs  Current medications and supplements including opioid prescriptions.  Functional ability and status Nutritional status Physical activity Advanced directives List of other physicians Hospitalizations, surgeries, and ER visits in previous 12 months Vitals Screenings to include cognitive, depression, and falls Referrals and appointments  In addition, I have reviewed and discussed with patient certain preventive protocols, quality metrics, and best practice recommendations. A written personalized care plan for preventive services as well as general preventive health recommendations were provided to patient.     Clemetine Marker, LPN   [id number]   Nurse Notes: none

## 2021-01-12 NOTE — Patient Instructions (Signed)
Jacqueline Kaiser , Thank you for taking time to come for your Medicare Wellness Visit. I appreciate your ongoing commitment to your health goals. Please review the following plan we discussed and let me know if I can assist you in the future.   Screening recommendations/referrals: Colonoscopy: no longer required Mammogram: done 02/25/20. Please call [phone] to schedule your mammogram.  Bone Density: done 02/25/20 Recommended yearly ophthalmology/optometry visit for glaucoma screening and checkup Recommended yearly dental visit for hygiene and checkup  Vaccinations: Influenza vaccine: done 01/04/21 Pneumococcal vaccine: done 08/30/17 Tdap vaccine: done 10/30/18 Shingles vaccine: done 10/30/18; we will contact Kristopher Oppenheim for your second dose information   Covid-19:done 04/29/19, 05/20/19, 03/03/20 & 07/03/20  Conditions/risks identified: Keep up the great work!  Next appointment: Follow up in one year for your annual wellness visit    Preventive Care 65 Years and Older, Female Preventive care refers to lifestyle choices and visits with your health care provider that can promote health and wellness. What does preventive care include? A yearly physical exam. This is also called an annual well check. Dental exams once or twice a year. Routine eye exams. Ask your health care provider how often you should have your eyes checked. Personal lifestyle choices, including: Daily care of your teeth and gums. Regular physical activity. Eating a healthy diet. Avoiding tobacco and drug use. Limiting alcohol use. Practicing safe sex. Taking low-dose aspirin every day. Taking vitamin and mineral supplements as recommended by your health care provider. What happens during an annual well check? The services and screenings done by your health care provider during your annual well check will depend on your age, overall health, lifestyle risk factors, and family history of disease. Counseling  Your health  care provider may ask you questions about your: Alcohol use. Tobacco use. Drug use. Emotional well-being. Home and relationship well-being. Sexual activity. Eating habits. History of falls. Memory and ability to understand (cognition). Work and work Statistician. Reproductive health. Screening  You may have the following tests or measurements: Height, weight, and BMI. Blood pressure. Lipid and cholesterol levels. These may be checked every 5 years, or more frequently if you are over 77 years old. Skin check. Lung cancer screening. You may have this screening every year starting at age 77 if you have a 30-pack-year history of smoking and currently smoke or have quit within the past 15 years. Fecal occult blood test (FOBT) of the stool. You may have this test every year starting at age 77. Flexible sigmoidoscopy or colonoscopy. You may have a sigmoidoscopy every 5 years or a colonoscopy every 10 years starting at age 77. Hepatitis C blood test. Hepatitis B blood test. Sexually transmitted disease (STD) testing. Diabetes screening. This is done by checking your blood sugar (glucose) after you have not eaten for a while (fasting). You may have this done every 1-3 years. Bone density scan. This is done to screen for osteoporosis. You may have this done starting at age 77. Mammogram. This may be done every 1-2 years. Talk to your health care provider about how often you should have regular mammograms. Talk with your health care provider about your test results, treatment options, and if necessary, the need for more tests. Vaccines  Your health care provider may recommend certain vaccines, such as: Influenza vaccine. This is recommended every year. Tetanus, diphtheria, and acellular pertussis (Tdap, Td) vaccine. You may need a Td booster every 10 years. Zoster vaccine. You may need this after age 77. Pneumococcal 13-valent conjugate (PCV13)  vaccine. One dose is recommended after age  77. Pneumococcal polysaccharide (PPSV23) vaccine. One dose is recommended after age 77. Talk to your health care provider about which screenings and vaccines you need and how often you need them. This information is not intended to replace advice given to you by your health care provider. Make sure you discuss any questions you have with your health care provider. Document Released: 04/17/2015 Document Revised: 12/09/2015 Document Reviewed: 01/20/2015 Elsevier Interactive Patient Education  2017 [place] Prevention in the Home Falls can cause injuries. They can happen to people of all ages. There are many things you can do to make your home safe and to help prevent falls. What can I do on the outside of my home? Regularly fix the edges of walkways and driveways and fix any cracks. Remove anything that might make you trip as you walk through a door, such as a raised step or threshold. Trim any bushes or trees on the path to your home. Use bright outdoor lighting. Clear any walking paths of anything that might make someone trip, such as rocks or tools. Regularly check to see if handrails are loose or broken. Make sure that both sides of any steps have handrails. Any raised decks and porches should have guardrails on the edges. Have any leaves, snow, or ice cleared regularly. Use sand or salt on walking paths during winter. Clean up any spills in your garage right away. This includes oil or grease spills. What can I do in the bathroom? Use night lights. Install grab bars by the toilet and in the tub and shower. Do not use towel bars as grab bars. Use non-skid mats or decals in the tub or shower. If you need to sit down in the shower, use a plastic, non-slip stool. Keep the floor dry. Clean up any water that spills on the floor as soon as it happens. Remove soap buildup in the tub or shower regularly. Attach bath mats securely with double-sided non-slip rug tape. Do not have throw  rugs and other things on the floor that can make you trip. What can I do in the bedroom? Use night lights. Make sure that you have a light by your bed that is easy to reach. Do not use any sheets or blankets that are too big for your bed. They should not hang down onto the floor. Have a firm chair that has side arms. You can use this for support while you get dressed. Do not have throw rugs and other things on the floor that can make you trip. What can I do in the kitchen? Clean up any spills right away. Avoid walking on wet floors. Keep items that you use a lot in easy-to-reach places. If you need to reach something above you, use a strong step stool that has a grab bar. Keep electrical cords out of the way. Do not use floor polish or wax that makes floors slippery. If you must use wax, use non-skid floor wax. Do not have throw rugs and other things on the floor that can make you trip. What can I do with my stairs? Do not leave any items on the stairs. Make sure that there are handrails on both sides of the stairs and use them. Fix handrails that are broken or loose. Make sure that handrails are as long as the stairways. Check any carpeting to make sure that it is firmly attached to the stairs. Fix any carpet that is loose  or worn. Avoid having throw rugs at the top or bottom of the stairs. If you do have throw rugs, attach them to the floor with carpet tape. Make sure that you have a light switch at the top of the stairs and the bottom of the stairs. If you do not have them, ask someone to add them for you. What else can I do to help prevent falls? Wear shoes that: Do not have high heels. Have rubber bottoms. Are comfortable and fit you well. Are closed at the toe. Do not wear sandals. If you use a stepladder: Make sure that it is fully opened. Do not climb a closed stepladder. Make sure that both sides of the stepladder are locked into place. Ask someone to hold it for you, if  possible. Clearly mark and make sure that you can see: Any grab bars or handrails. First and last steps. Where the edge of each step is. Use tools that help you move around (mobility aids) if they are needed. These include: Canes. Walkers. Scooters. Crutches. Turn on the lights when you go into a dark area. Replace any light bulbs as soon as they burn out. Set up your furniture so you have a clear path. Avoid moving your furniture around. If any of your floors are uneven, fix them. If there are any pets around you, be aware of where they are. Review your medicines with your doctor. Some medicines can make you feel dizzy. This can increase your chance of falling. Ask your doctor what other things that you can do to help prevent falls. This information is not intended to replace advice given to you by your health care provider. Make sure you discuss any questions you have with your health care provider. Document Released: 01/15/2009 Document Revised: 08/27/2015 Document Reviewed: 04/25/2014 Elsevier Interactive Patient Education  2017 [employer].

## 2021-02-03 ENCOUNTER — Ambulatory Visit: Payer: PPO | Admitting: Orthopaedic Surgery

## 2021-02-03 DIAGNOSIS — Z20822 Contact with and (suspected) exposure to covid-19: Secondary | ICD-10-CM | POA: Diagnosis not present

## 2021-03-02 ENCOUNTER — Ambulatory Visit
Admission: RE | Admit: 2021-03-02 | Discharge: 2021-03-02 | Disposition: A | Discharge status: Home / Self Care | Payer: PPO | Source: Ambulatory Visit | Attending: Family Medicine | Admitting: Family Medicine

## 2021-03-02 ENCOUNTER — Other Ambulatory Visit: Payer: Self-pay

## 2021-03-02 DIAGNOSIS — Z1231 Encounter for screening mammogram for malignant neoplasm of breast: Secondary | ICD-10-CM | POA: Insufficient documentation

## 2021-04-19 DIAGNOSIS — H43813 Vitreous degeneration, bilateral: Secondary | ICD-10-CM | POA: Diagnosis not present

## 2021-05-14 ENCOUNTER — Ambulatory Visit: Payer: PPO | Admitting: Podiatry

## 2021-05-14 ENCOUNTER — Ambulatory Visit (INDEPENDENT_AMBULATORY_CARE_PROVIDER_SITE_OTHER): Payer: PPO

## 2021-05-14 ENCOUNTER — Other Ambulatory Visit: Payer: Self-pay

## 2021-05-14 VITALS — BP 117/66 | HR 64 | Resp 16

## 2021-05-14 DIAGNOSIS — M722 Plantar fascial fibromatosis: Secondary | ICD-10-CM

## 2021-05-17 ENCOUNTER — Telehealth: Payer: Self-pay | Admitting: *Deleted

## 2021-05-17 ENCOUNTER — Telehealth: Payer: Self-pay | Admitting: Podiatry

## 2021-05-17 MED ORDER — MELOXICAM 15 MG PO TABS: 15.0000 mg | ORAL_TABLET | Freq: Every day | ORAL | 1 refills | Status: DC

## 2021-05-17 MED ORDER — METHYLPREDNISOLONE 4 MG PO TBPK: ORAL_TABLET | ORAL | 0 refills | Status: DC

## 2021-05-17 NOTE — Telephone Encounter (Signed)
Thank you, Dr. Makhari Dovidio

## 2021-05-17 NOTE — Telephone Encounter (Signed)
Pt called back requesting an update on those 2 medications that haven't been called in. Please advise.

## 2021-05-17 NOTE — Telephone Encounter (Signed)
Patient notified via voice mail that per Dr. Amalia Hailey verbal, medication has been sent to pharmacy

## 2021-05-17 NOTE — Telephone Encounter (Signed)
Patient seen Friday-didn't receive 2 medications from visit-send to [facility]

## 2021-05-24 DIAGNOSIS — M810 Age-related osteoporosis without current pathological fracture: Secondary | ICD-10-CM | POA: Diagnosis not present

## 2021-05-25 DIAGNOSIS — M722 Plantar fascial fibromatosis: Secondary | ICD-10-CM | POA: Diagnosis not present

## 2021-05-25 MED ORDER — BETAMETHASONE SOD PHOS & ACET 6 (3-3) MG/ML IJ SUSP
3.0000 mg | Freq: Once | INTRAMUSCULAR | Status: AC
  Administered 2021-05-25: 3 mg via INTRA_ARTICULAR

## 2021-05-25 NOTE — Progress Notes (Signed)
° °  Subjective: 76 y.o. female presenting today as a new patient for evaluation of pain and tenderness to the bilateral heels has been going on for approximately 3-5 months now.  Gradual onset.  She denies a history of injury.  She says that she experiences sharp pain especially when getting up in the morning and standing.  She has tried different shoes, insoles in the shoes, Tylenol, stretching exercises, heat, and compression boots with no improvement.  She presents for further treatment and evaluation   Past Medical History:  Diagnosis Date   Allergy    Ankle fracture 2014   Left   Anxiety    Female cystocele    Grade 2   Fracture    lower back after sneezing   GERD (gastroesophageal reflux disease)    History of   History of vertigo    last attack about 1 week ago   Hyperlipidemia    Insomnia    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   Right, .4 x 1.3 cm, noted on [place] ABD 05/2016, left lobe of the liver with the largest measuring 9.9 mm in diameter [place] ABD 06/2010   Low back sprain    Nonsustained ventricular tachycardia    by Holter,  noraml Stress test ECHO perpatient [name], [place])   Obese    Osteoporosis    Sinusitis    Urinary frequency    Wrist fracture    Left     Objective: Physical Exam General: The patient is alert and oriented x3 in no acute distress.  Dermatology: Skin is warm, dry and supple bilateral lower extremities. Negative for open lesions or macerations bilateral.   Vascular: Dorsalis Pedis and Posterior Tibial pulses palpable bilateral.  Capillary fill time is immediate to all digits.  Neurological: Epicritic and protective threshold intact bilateral.   Musculoskeletal: Tenderness to palpation to the plantar aspect of the bilateral heels along the plantar fascia. All other joints range of motion within normal limits bilateral. Strength 5/5 in all groups bilateral.   Radiographic exam: Normal osseous mineralization. Joint spaces preserved. No fracture/dislocation/boney  destruction. No other soft tissue abnormalities or radiopaque foreign bodies.   Assessment: 1. plantar fasciitis bilateral feet  Plan of Care:  1. Patient evaluated. Xrays reviewed.   2. Injection of 0.5cc Celestone soluspan injected into the bilateral heels.  3. Rx for Medrol Dose Pak placed 4. Rx for Meloxicam ordered for patient. 5. Plantar fascial band(s) dispensed for bilateral plantar fasciitis. 6. Instructed patient regarding therapies and modalities at home to alleviate symptoms.  7.  Continue good feet arch supports  8.  Return to clinic in 4 weeks.    *Works at [employer] @ [place] M. [address] Gracia, DPM [facility]  Dr. Edrick Kins, DPM    [address], [place] [address]                Office [phone]  Fax [phone]

## 2021-06-04 DIAGNOSIS — M810 Age-related osteoporosis without current pathological fracture: Secondary | ICD-10-CM | POA: Diagnosis not present

## 2021-06-11 ENCOUNTER — Other Ambulatory Visit: Payer: Self-pay

## 2021-06-11 ENCOUNTER — Ambulatory Visit: Payer: PPO | Admitting: Podiatry

## 2021-06-11 ENCOUNTER — Encounter: Payer: Self-pay | Admitting: Podiatry

## 2021-06-11 DIAGNOSIS — M722 Plantar fascial fibromatosis: Secondary | ICD-10-CM

## 2021-06-11 MED ORDER — BETAMETHASONE SOD PHOS & ACET 6 (3-3) MG/ML IJ SUSP
3.0000 mg | Freq: Once | INTRAMUSCULAR | Status: AC
  Administered 2021-06-11: 3 mg via INTRA_ARTICULAR

## 2021-06-11 NOTE — Progress Notes (Signed)
? ?  Subjective: ?76 y.o. female presenting today for follow-up evaluation of plantar fasciitis to the bilateral feet.  Patient states there is significant improvement.  She took the prednisone pack and is taking the meloxicam as instructed.  She also says the injections helped significantly.  No new complaints at this time ? ? ?Past Medical History:  ?Diagnosis Date  ? Allergy   ? Ankle fracture 2014  ? Left  ? Anxiety   ? Female cystocele   ? Grade 2  ? Fracture   ? lower back after sneezing  ? GERD (gastroesophageal reflux disease)   ? History of  ? History of vertigo   ? last attack about 1 week ago  ? Hyperlipidemia   ? Insomnia   ? 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  ? Right, .4 x 1.3 cm, noted on [place] ABD 05/2016, left lobe of the liver with the largest measuring 9.9 mm in diameter [place] ABD 06/2010  ? Low back sprain   ? Nonsustained ventricular tachycardia   ? by Holter,  noraml Stress test ECHO perpatient [name], [place])  ? Obese   ? Osteoporosis   ? Sinusitis   ? Urinary frequency   ? Wrist fracture   ? Left  ? ? ? ?Objective: ?Physical Exam ?General: The patient is alert and oriented x3 in no acute distress. ? ?Dermatology: Skin is warm, dry and supple bilateral lower extremities. Negative for open lesions or macerations bilateral.  ? ?Vascular: Dorsalis Pedis and Posterior Tibial pulses palpable bilateral.  Capillary fill time is immediate to all digits. ? ?Neurological: Epicritic and protective threshold intact bilateral.  ? ?Musculoskeletal: There continues to be some residual tenderness to palpation to the plantar aspect of the bilateral heels along the plantar fascia. All other joints range of motion within normal limits bilateral. Strength 5/5 in all groups bilateral.   ? ?Assessment: ?1. plantar fasciitis bilateral feet ? ?Plan of Care:  ?1. Patient evaluated.   ?2. Injection of 0.5cc Celestone soluspan injected into the bilateral heels.  ?3.  Continue meloxicam 15 mg daily ?4.  Patient did not get good relief with the  plantar fascial braces.  She may discontinue ?5.  Continue wearing OTC arch supports ?6.  Return to clinic 4 weeks ? ?*Works at [employer] ? ?Edrick Kins, DPM ?[place] ? ?Dr. Edrick Kins, DPM  ?  ?[address]                                   ?[place], [place] [address]                ?Office [phone]  ?Fax [phone] ? ? ? ? ?

## 2021-07-02 NOTE — Progress Notes (Signed)
Name: Jacqueline Kaiser   MRN: [id number]    DOB: Jul 03, 1945   Date:07/05/2021 ? ?     Progress Note ? ?Subjective ? ?Chief Complaint ? ?Follow Up ? ?HPI ? ?Osteoporosis history of vertebral compression fracture:  She is still getting Prolia with Dr. Gabriel Carina and denies side effects of medication.  Last infusion was 03/23 , goes twice a year, last vitamin D was normal  ?  ?Chronic lumbar spine pain: she states it has been going on for years, however fall of 2018 pain was severe and did not improve with chiropractor care, she was sent to [place] and found to have radiculitis but she is doing well now  She states pain now is intermittent , no problems when walking or standing anymore  ?  ?GERD: she states symptoms are controlled taking it prn only when she knows she will splurge. She also states has intermittent/very seldom an epigastric pain described as dull / like a punch in the stomach that resolves when she stretches. No indigestion, weight loss or change in bowel movements. She asked about colon cancer screening and since she is healthy we will do one more cologuard ? ?CKI : she used to have stage III but labs have been normal for a while., she has been avoiding NSAID's and drink more water  ?  ?NSVT: she states diagnosed years ago by a cardiologist, no chest pain or palpation, she denies any symptoms. Denies decrease in exercise tolerance  ?  ?Hyperlipidemia: taking Crestor, denies myalgia, chest pain or palpitation. Last LDL was 123 , she wants to continue current dose and resume physical activity . We will continue to monitor  ?  ?Insomnia: she states she falls asleep without problems, but she was waking up  in the middle of the night but since started on Seroquel she is able to go back to bed., she likes the medication and needs a refill   ? ?Coccyalgia: seen by Ortho Fall 2022  now doing well, not taking medications ? ?Vertigo: she had one episode of vertigo last week, she states it happens intermittently,  she takes meclizine prn, a few times a year  ? ?Patient Active Problem List  ? Diagnosis Date Noted  ? Coccyalgia 01/11/2021  ? Cystocele with prolapse 06/19/2018  ? Age-related osteoporosis without current pathological fracture 01/08/2018  ? History of vertebral compression fracture 05/23/2017  ? Incomplete bladder emptying 05/16/2016  ? GERD (gastroesophageal reflux disease) 02/14/2016  ? Insomnia due to anxiety and fear 02/14/2016  ? Allergy 07/01/2013  ? Osteoporosis of lumbar spine 05/19/2013  ? Overweight (BMI 25.0-29.9) 08/28/2011  ? Peripheral vertigo 08/28/2011  ? Nonsustained ventricular tachycardia ([place])   ? Hyperlipidemia   ? ? ?Past Surgical History:  ?Procedure Laterality Date  ? ABDOMINAL HYSTERECTOMY    ? BREAST BIOPSY Right   ? negative 10-12 years ago- core  ? CESAREAN SECTION    ? COLONOSCOPY  2012  ? CYSTOCELE REPAIR N/A 06/19/2018  ? Procedure: CYSTOSCOPY ANTERIOR REPAIR (CYSTOCELE);  Surgeon: Bjorn Loser, MD;  Location: [facility];  Service: Urology;  Laterality: N/A;  ? TONSILLECTOMY    ? ? ?Family History  ?Problem Relation Age of Onset  ? Heart disease Mother   ?     ATRIAL FIB  ? Cancer Father 84  ?     Lung Ca,  nonsmoker, metastatic at diagnosis  asbestos exposure  ? Breast cancer Daughter 68  ?     dx at age of  49 and 52  ? ? ?Social History  ? ?Tobacco Use  ? Smoking status: Former  ?  Packs/day: 0.50  ?  Years: 10.00  ?  Pack years: 5.00  ?  Types: Cigarettes  ?  Quit date: 04/04/1978  ?  Years since quitting: 43.2  ? Smokeless tobacco: Never  ? Tobacco comments:  ?  smoking cessation materials not required  ?Substance Use Topics  ? Alcohol use: Yes  ?  Comment: wine occasionally  ? ? ? ?Current Outpatient Medications:  ?  Ascorbic Acid (VITAMIN C) 100 MG tablet, Take 100 mg by mouth daily., Disp: , Rfl:  ?  calcium carbonate (OS-CAL) 600 MG TABS tablet, Take by mouth., Disp: , Rfl:  ?  cholecalciferol (VITAMIN D3) 25 MCG (1000 UT) tablet, Take 1,000 Units by mouth daily., Disp: , Rfl:   ?  denosumab (PROLIA) 60 MG/ML SOSY injection, Inject 1 mL into the skin every 6 (six) months., Disp: , Rfl:  ?  meclizine (ANTIVERT) 12.5 MG tablet, Take 1 tablet (12.5 mg total) by mouth 4 (four) times daily as needed for dizziness., Disp: 30 tablet, Rfl: 0 ?  omeprazole (PRILOSEC) 20 MG capsule, Take 20 mg by mouth daily., Disp: , Rfl:  ?  polyethylene glycol powder (GLYCOLAX/MIRALAX) 17 GM/SCOOP powder, Take 17 g by mouth once. PRN, Disp: , Rfl:  ?  QUEtiapine (SEROQUEL) 25 MG tablet, Take 1 tablet (25 mg total) by mouth at bedtime., Disp: 90 tablet, Rfl: 1 ?  rosuvastatin (CRESTOR) 20 MG tablet, TAKE 1 TABLET BY MOUTH EVERY EVENING., Disp: 90 tablet, Rfl: 1 ?  vitamin B-12 (CYANOCOBALAMIN) 500 MCG tablet, Take 500 mcg by mouth daily., Disp: , Rfl:  ?  meloxicam (MOBIC) 15 MG tablet, Take 1 tablet (15 mg total) by mouth daily. (Patient not taking: Reported on 07/05/2021), Disp: 30 tablet, Rfl: 1 ? ?Allergies  ?Allergen Reactions  ? Betadine [Povidone Iodine] Other (See Comments)  ?  Burns  ? Iodine   ? ? ?I personally reviewed active problem list, medication list, allergies, family history, social history, health maintenance with the patient/caregiver today. ? ? ?ROS ? ?Constitutional: Negative for fever or weight change.  ?Respiratory: Negative for cough and shortness of breath.   ?Cardiovascular: Negative for chest pain or palpitations.  ?Gastrointestinal: Negative for abdominal pain, no bowel changes.  ?Musculoskeletal: Negative for gait problem or joint swelling.  ?Skin: Negative for rash.  ?Neurological: Negative for dizziness or headache.  ?No other specific complaints in a complete review of systems (except as listed in HPI above).  ? ?Objective ? ?Vitals:  ? 07/05/21 0738  ?BP: 122/68  ?Pulse: 82  ?Resp: 16  ?SpO2: 98%  ?Weight: 185 lb (83.9 kg)  ?Height: '5\' 7"'$  (1.702 m)  ? ? ?Body mass index is 28.98 kg/m?. ? ?Physical Exam ? ?Constitutional: Patient appears well-developed and well-nourished. Overweight.   No distress.  ?HEENT: head atraumatic, normocephalic, pupils equal and reactive to light, neck supple ?Cardiovascular: Normal rate, regular rhythm and normal heart sounds.  No murmur heard. No BLE edema. ?Pulmonary/Chest: Effort normal and breath sounds normal. No respiratory distress. ?Abdominal: Soft.  There is no tenderness. ?Psychiatric: Patient has a normal mood and affect. behavior is normal. Judgment and thought content normal.  ? ?PHQ2/9: ? ?  07/05/2021  ?  7:37 AM 01/12/2021  ?  8:51 AM 01/11/2021  ? 10:10 AM 01/04/2021  ?  9:29 AM 07/03/2020  ?  1:33 PM  ?Depression screen PHQ 2/9  ?Decreased  Interest 0 0 0 0 0  ?Down, Depressed, Hopeless 0 0 0 0 0  ?PHQ - 2 Score 0 0 0 0 0  ?Altered sleeping 0 0 0    ?Tired, decreased energy 0 0 0    ?Change in appetite 0 0 0    ?Feeling bad or failure about yourself  0 0 0    ?Trouble concentrating 0 0 0    ?Moving slowly or fidgety/restless 0 0 0    ?Suicidal thoughts 0 0 0    ?PHQ-9 Score 0 0 0    ?Difficult doing work/chores  Not difficult at all Not difficult at all    ?  ?phq 9 is negative ? ? ?Fall Risk: ? ?  07/05/2021  ?  7:37 AM 01/12/2021  ?  8:53 AM 01/11/2021  ? 10:10 AM 01/04/2021  ?  9:29 AM 07/03/2020  ?  1:33 PM  ?Fall Risk   ?Falls in the past year? 0 0 0 0 0  ?Number falls in past yr: 0 0 0 0 0  ?Injury with Fall? 0 0 0 0 0  ?Risk for fall due to : No Fall Risks No Fall Risks  No Fall Risks   ?Follow up Falls prevention discussed Falls prevention discussed  Falls prevention discussed   ? ? ? ? ?Functional Status Survey: ?Is the patient deaf or have difficulty hearing?: No ?Does the patient have difficulty seeing, even when wearing glasses/contacts?: No ?Does the patient have difficulty concentrating, remembering, or making decisions?: No ?Does the patient have difficulty walking or climbing stairs?: No ?Does the patient have difficulty dressing or bathing?: No ?Does the patient have difficulty doing errands alone such as visiting a doctor's office or shopping?:  No ? ? ? ?Assessment & Plan ? ?1. Pure hypercholesterolemia ? ? rosuvastatin (CRESTOR) 20 MG tablet; Take 1 tablet (20 mg total) by mouth every evening.  Dispense: 90 tablet; Refill: 1 ? ?2. Primary

## 2021-07-03 ENCOUNTER — Other Ambulatory Visit: Payer: Self-pay | Admitting: Family Medicine

## 2021-07-03 DIAGNOSIS — E7800 Pure hypercholesterolemia, unspecified: Secondary | ICD-10-CM

## 2021-07-05 ENCOUNTER — Ambulatory Visit (INDEPENDENT_AMBULATORY_CARE_PROVIDER_SITE_OTHER): Payer: PPO | Admitting: Family Medicine

## 2021-07-05 ENCOUNTER — Encounter: Payer: Self-pay | Admitting: Family Medicine

## 2021-07-05 VITALS — BP 122/68 | HR 82 | Resp 16 | Ht 67.0 in | Wt 185.0 lb

## 2021-07-05 DIAGNOSIS — I4729 Other ventricular tachycardia: Secondary | ICD-10-CM

## 2021-07-05 DIAGNOSIS — K219 Gastro-esophageal reflux disease without esophagitis: Secondary | ICD-10-CM | POA: Diagnosis not present

## 2021-07-05 DIAGNOSIS — Z1211 Encounter for screening for malignant neoplasm of colon: Secondary | ICD-10-CM | POA: Diagnosis not present

## 2021-07-05 DIAGNOSIS — E7800 Pure hypercholesterolemia, unspecified: Secondary | ICD-10-CM | POA: Diagnosis not present

## 2021-07-05 DIAGNOSIS — M810 Age-related osteoporosis without current pathological fracture: Secondary | ICD-10-CM | POA: Diagnosis not present

## 2021-07-05 DIAGNOSIS — F5101 Primary insomnia: Secondary | ICD-10-CM | POA: Diagnosis not present

## 2021-07-05 DIAGNOSIS — M545 Low back pain, unspecified: Secondary | ICD-10-CM | POA: Diagnosis not present

## 2021-07-05 DIAGNOSIS — M533 Sacrococcygeal disorders, not elsewhere classified: Secondary | ICD-10-CM | POA: Diagnosis not present

## 2021-07-05 MED ORDER — ROSUVASTATIN CALCIUM 20 MG PO TABS: 20.0000 mg | ORAL_TABLET | Freq: Every evening | ORAL | 1 refills | Status: DC

## 2021-07-05 MED ORDER — QUETIAPINE FUMARATE 25 MG PO TABS: 25.0000 mg | ORAL_TABLET | Freq: Every day | ORAL | 1 refills | Status: DC

## 2021-07-16 DIAGNOSIS — Z1211 Encounter for screening for malignant neoplasm of colon: Secondary | ICD-10-CM | POA: Diagnosis not present

## 2021-07-20 ENCOUNTER — Ambulatory Visit: Payer: PPO | Admitting: Podiatry

## 2021-07-20 DIAGNOSIS — M722 Plantar fascial fibromatosis: Secondary | ICD-10-CM | POA: Diagnosis not present

## 2021-07-24 LAB — COLOGUARD: COLOGUARD: NEGATIVE

## 2021-07-27 MED ORDER — BETAMETHASONE SOD PHOS & ACET 6 (3-3) MG/ML IJ SUSP: 3.0000 mg | Freq: Once | INTRAMUSCULAR | Status: DC

## 2021-07-27 NOTE — Progress Notes (Signed)
? ?  Subjective: ?76 y.o. female presenting today for follow-up evaluation of plantar fasciitis to the bilateral feet.  Patient states that the pain has improved significantly but there is some residual tenderness.  She says the injections helped significantly.  She continues to take meloxicam daily as needed.  Presenting for follow-up treatment and evaluation.  No new complaints at this time ? ?Past Medical History:  ?Diagnosis Date  ? Allergy   ? Ankle fracture 2014  ? Left  ? Anxiety   ? Female cystocele   ? Grade 2  ? Fracture   ? lower back after sneezing  ? GERD (gastroesophageal reflux disease)   ? History of  ? History of vertigo   ? last attack about 1 week ago  ? Hyperlipidemia   ? Insomnia   ? 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  ? Right, .4 x 1.3 cm, noted on [place] ABD 05/2016, left lobe of the liver with the largest measuring 9.9 mm in diameter [place] ABD 06/2010  ? Low back sprain   ? Nonsustained ventricular tachycardia ([place])   ? by Holter,  noraml Stress test ECHO perpatient [name], [place])  ? Obese   ? Osteoporosis   ? Sinusitis   ? Urinary frequency   ? Wrist fracture   ? Left  ? ? ? ?Objective: ?Physical Exam ?General: The patient is alert and oriented x3 in no acute distress. ? ?Dermatology: Skin is warm, dry and supple bilateral lower extremities. Negative for open lesions or macerations bilateral.  ? ?Vascular: Dorsalis Pedis and Posterior Tibial pulses palpable bilateral.  Capillary fill time is immediate to all digits. ? ?Neurological: Epicritic and protective threshold intact bilateral.  ? ?Musculoskeletal: There continues to be some residual tenderness to palpation to the plantar aspect of the bilateral heels along the plantar fascia. All other joints range of motion within normal limits bilateral. Strength 5/5 in all groups bilateral.   ? ?Assessment: ?1. plantar fasciitis bilateral feet ? ?Plan of Care:  ?1. Patient evaluated.   ?2. Injection of 0.5cc Celestone soluspan injected into the bilateral heels.  ?3.   Continue meloxicam 15 mg daily ?4.  Patient did not get good relief with the plantar fascial braces.   ?5.  OTC power step insoles were dispensed for the patient.  Wear daily ?6.  Return to clinic as needed ? ?*Works at [employer] ? ?Edrick Kins, DPM ?[place] ? ?Dr. Edrick Kins, DPM  ?  ?[address]                                   ?[place], [place] [address]                ?Office [phone]  ?Fax [phone] ? ? ? ? ?

## 2021-08-11 DIAGNOSIS — M5136 Other intervertebral disc degeneration, lumbar region: Secondary | ICD-10-CM | POA: Diagnosis not present

## 2021-08-11 DIAGNOSIS — M9902 Segmental and somatic dysfunction of thoracic region: Secondary | ICD-10-CM | POA: Diagnosis not present

## 2021-08-11 DIAGNOSIS — M9903 Segmental and somatic dysfunction of lumbar region: Secondary | ICD-10-CM | POA: Diagnosis not present

## 2021-08-11 DIAGNOSIS — M546 Pain in thoracic spine: Secondary | ICD-10-CM | POA: Diagnosis not present

## 2021-08-12 DIAGNOSIS — M9902 Segmental and somatic dysfunction of thoracic region: Secondary | ICD-10-CM | POA: Diagnosis not present

## 2021-08-12 DIAGNOSIS — M9903 Segmental and somatic dysfunction of lumbar region: Secondary | ICD-10-CM | POA: Diagnosis not present

## 2021-08-12 DIAGNOSIS — M546 Pain in thoracic spine: Secondary | ICD-10-CM | POA: Diagnosis not present

## 2021-08-12 DIAGNOSIS — M5136 Other intervertebral disc degeneration, lumbar region: Secondary | ICD-10-CM | POA: Diagnosis not present

## 2021-08-16 DIAGNOSIS — M546 Pain in thoracic spine: Secondary | ICD-10-CM | POA: Diagnosis not present

## 2021-08-16 DIAGNOSIS — M5136 Other intervertebral disc degeneration, lumbar region: Secondary | ICD-10-CM | POA: Diagnosis not present

## 2021-08-16 DIAGNOSIS — M9902 Segmental and somatic dysfunction of thoracic region: Secondary | ICD-10-CM | POA: Diagnosis not present

## 2021-08-16 DIAGNOSIS — M9903 Segmental and somatic dysfunction of lumbar region: Secondary | ICD-10-CM | POA: Diagnosis not present

## 2021-08-17 DIAGNOSIS — M9903 Segmental and somatic dysfunction of lumbar region: Secondary | ICD-10-CM | POA: Diagnosis not present

## 2021-08-17 DIAGNOSIS — M546 Pain in thoracic spine: Secondary | ICD-10-CM | POA: Diagnosis not present

## 2021-08-17 DIAGNOSIS — M5136 Other intervertebral disc degeneration, lumbar region: Secondary | ICD-10-CM | POA: Diagnosis not present

## 2021-08-17 DIAGNOSIS — M9902 Segmental and somatic dysfunction of thoracic region: Secondary | ICD-10-CM | POA: Diagnosis not present

## 2021-08-20 DIAGNOSIS — M546 Pain in thoracic spine: Secondary | ICD-10-CM | POA: Diagnosis not present

## 2021-08-20 DIAGNOSIS — M9903 Segmental and somatic dysfunction of lumbar region: Secondary | ICD-10-CM | POA: Diagnosis not present

## 2021-08-20 DIAGNOSIS — M5136 Other intervertebral disc degeneration, lumbar region: Secondary | ICD-10-CM | POA: Diagnosis not present

## 2021-08-20 DIAGNOSIS — M9902 Segmental and somatic dysfunction of thoracic region: Secondary | ICD-10-CM | POA: Diagnosis not present

## 2021-08-23 DIAGNOSIS — M5136 Other intervertebral disc degeneration, lumbar region: Secondary | ICD-10-CM | POA: Diagnosis not present

## 2021-08-23 DIAGNOSIS — M9902 Segmental and somatic dysfunction of thoracic region: Secondary | ICD-10-CM | POA: Diagnosis not present

## 2021-08-23 DIAGNOSIS — M546 Pain in thoracic spine: Secondary | ICD-10-CM | POA: Diagnosis not present

## 2021-08-23 DIAGNOSIS — M9903 Segmental and somatic dysfunction of lumbar region: Secondary | ICD-10-CM | POA: Diagnosis not present

## 2021-08-25 DIAGNOSIS — M9903 Segmental and somatic dysfunction of lumbar region: Secondary | ICD-10-CM | POA: Diagnosis not present

## 2021-08-25 DIAGNOSIS — M9902 Segmental and somatic dysfunction of thoracic region: Secondary | ICD-10-CM | POA: Diagnosis not present

## 2021-08-25 DIAGNOSIS — M546 Pain in thoracic spine: Secondary | ICD-10-CM | POA: Diagnosis not present

## 2021-08-25 DIAGNOSIS — M5136 Other intervertebral disc degeneration, lumbar region: Secondary | ICD-10-CM | POA: Diagnosis not present

## 2021-08-27 DIAGNOSIS — M9902 Segmental and somatic dysfunction of thoracic region: Secondary | ICD-10-CM | POA: Diagnosis not present

## 2021-08-27 DIAGNOSIS — M9903 Segmental and somatic dysfunction of lumbar region: Secondary | ICD-10-CM | POA: Diagnosis not present

## 2021-08-27 DIAGNOSIS — M5136 Other intervertebral disc degeneration, lumbar region: Secondary | ICD-10-CM | POA: Diagnosis not present

## 2021-08-27 DIAGNOSIS — M546 Pain in thoracic spine: Secondary | ICD-10-CM | POA: Diagnosis not present

## 2021-08-31 ENCOUNTER — Encounter: Payer: Self-pay | Admitting: Podiatry

## 2021-08-31 ENCOUNTER — Ambulatory Visit (INDEPENDENT_AMBULATORY_CARE_PROVIDER_SITE_OTHER): Payer: PPO | Admitting: Podiatry

## 2021-08-31 DIAGNOSIS — M722 Plantar fascial fibromatosis: Secondary | ICD-10-CM

## 2021-08-31 DIAGNOSIS — M5136 Other intervertebral disc degeneration, lumbar region: Secondary | ICD-10-CM | POA: Diagnosis not present

## 2021-08-31 DIAGNOSIS — M9903 Segmental and somatic dysfunction of lumbar region: Secondary | ICD-10-CM | POA: Diagnosis not present

## 2021-08-31 DIAGNOSIS — M9902 Segmental and somatic dysfunction of thoracic region: Secondary | ICD-10-CM | POA: Diagnosis not present

## 2021-08-31 DIAGNOSIS — M546 Pain in thoracic spine: Secondary | ICD-10-CM | POA: Diagnosis not present

## 2021-08-31 NOTE — Progress Notes (Signed)
   Subjective: 76 y.o. female presenting today for follow-up evaluation of plantar fasciitis to the bilateral feet.  Patient states that she is doing much better.  She has no pain or tenderness associated to the bilateral heels.  She says that the arch supports helped significantly however she does feel that they are somewhat wide.  She tried to trim them down and make them more narrow to fit in her shoes.  She is no longer taking the meloxicam.  She presents for further treatment evaluation  Past Medical History:  Diagnosis Date   Allergy    Ankle fracture 2014   Left   Anxiety    Female cystocele    Grade 2   Fracture    lower back after sneezing   GERD (gastroesophageal reflux disease)    History of   History of vertigo    last attack about 1 week ago   Hyperlipidemia    Insomnia    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   Right, .4 x 1.3 cm, noted on [place] ABD 05/2016, left lobe of the liver with the largest measuring 9.9 mm in diameter [place] ABD 06/2010   Low back sprain    Nonsustained ventricular tachycardia ([place])    by Holter,  noraml Stress test ECHO perpatient [name], [place])   Obese    Osteoporosis    Sinusitis    Urinary frequency    Wrist fracture    Left     Objective: Physical Exam General: The patient is alert and oriented x3 in no acute distress.  Dermatology: Skin is warm, dry and supple bilateral lower extremities. Negative for open lesions or macerations bilateral.   Vascular: Dorsalis Pedis and Posterior Tibial pulses palpable bilateral.  Capillary fill time is immediate to all digits.  Neurological: Epicritic and protective threshold intact bilateral.   Musculoskeletal: Negative for any significant tenderness to palpation to the plantar aspect of the bilateral heels along the plantar fascia. All other joints range of motion within normal limits bilateral. Strength 5/5 in all groups bilateral.    Assessment: 1. plantar fasciitis bilateral feet  Plan of Care:  1. Patient  evaluated.  Overall patient doing very well 2. Continue meloxicam 15 mg daily PRN  4.  Continue OTC power step insoles as needed.  The patient did feel that the power step insoles were somewhat wider than she would like.  If she does want a different pair of insoles recommend [employer] 5.  Return to clinic as needed  *Works at [employer] @ [place] M. Jvon Meroney, [place] [facility]  Dr. Edrick Kins, DPM    [address], [place] [address]                Office [phone]  Fax [phone]

## 2021-09-01 DIAGNOSIS — M546 Pain in thoracic spine: Secondary | ICD-10-CM | POA: Diagnosis not present

## 2021-09-01 DIAGNOSIS — M9903 Segmental and somatic dysfunction of lumbar region: Secondary | ICD-10-CM | POA: Diagnosis not present

## 2021-09-01 DIAGNOSIS — M9902 Segmental and somatic dysfunction of thoracic region: Secondary | ICD-10-CM | POA: Diagnosis not present

## 2021-09-01 DIAGNOSIS — M5136 Other intervertebral disc degeneration, lumbar region: Secondary | ICD-10-CM | POA: Diagnosis not present

## 2021-09-03 DIAGNOSIS — M9903 Segmental and somatic dysfunction of lumbar region: Secondary | ICD-10-CM | POA: Diagnosis not present

## 2021-09-03 DIAGNOSIS — M5136 Other intervertebral disc degeneration, lumbar region: Secondary | ICD-10-CM | POA: Diagnosis not present

## 2021-09-03 DIAGNOSIS — M9902 Segmental and somatic dysfunction of thoracic region: Secondary | ICD-10-CM | POA: Diagnosis not present

## 2021-09-03 DIAGNOSIS — M546 Pain in thoracic spine: Secondary | ICD-10-CM | POA: Diagnosis not present

## 2021-09-06 DIAGNOSIS — M9902 Segmental and somatic dysfunction of thoracic region: Secondary | ICD-10-CM | POA: Diagnosis not present

## 2021-09-06 DIAGNOSIS — M9903 Segmental and somatic dysfunction of lumbar region: Secondary | ICD-10-CM | POA: Diagnosis not present

## 2021-09-06 DIAGNOSIS — M546 Pain in thoracic spine: Secondary | ICD-10-CM | POA: Diagnosis not present

## 2021-09-06 DIAGNOSIS — M5136 Other intervertebral disc degeneration, lumbar region: Secondary | ICD-10-CM | POA: Diagnosis not present

## 2021-09-07 DIAGNOSIS — M9903 Segmental and somatic dysfunction of lumbar region: Secondary | ICD-10-CM | POA: Diagnosis not present

## 2021-09-07 DIAGNOSIS — M5136 Other intervertebral disc degeneration, lumbar region: Secondary | ICD-10-CM | POA: Diagnosis not present

## 2021-09-07 DIAGNOSIS — M9902 Segmental and somatic dysfunction of thoracic region: Secondary | ICD-10-CM | POA: Diagnosis not present

## 2021-09-07 DIAGNOSIS — M546 Pain in thoracic spine: Secondary | ICD-10-CM | POA: Diagnosis not present

## 2021-09-08 ENCOUNTER — Encounter: Payer: Self-pay | Admitting: Family Medicine

## 2021-09-08 ENCOUNTER — Ambulatory Visit (INDEPENDENT_AMBULATORY_CARE_PROVIDER_SITE_OTHER): Payer: PPO | Admitting: Family Medicine

## 2021-09-08 ENCOUNTER — Ambulatory Visit: Payer: Self-pay

## 2021-09-08 VITALS — BP 112/64 | HR 76 | Resp 16 | Ht 67.0 in | Wt 181.0 lb

## 2021-09-08 DIAGNOSIS — M545 Low back pain, unspecified: Secondary | ICD-10-CM | POA: Insufficient documentation

## 2021-09-08 MED ORDER — MELOXICAM 15 MG PO TABS: 15.0000 mg | ORAL_TABLET | Freq: Every day | ORAL | 0 refills | Status: AC

## 2021-09-08 MED ORDER — TIZANIDINE HCL 4 MG PO TABS: 4.0000 mg | ORAL_TABLET | Freq: Four times a day (QID) | ORAL | 0 refills | Status: DC | PRN

## 2021-09-08 NOTE — Patient Instructions (Signed)
It was great to see you!  Our plans for today:  - Take the meloxicam as directed. - Try the stretches below after using heating pad or muscle relaxer.  Take care and seek immediate care sooner if you develop any concerns.   Dr. Romilda Garret on your back. Bend your right knee so that your right foot is flat on the floor. Cross your left leg over your right so that your left ankle rests on your right knee. Use your hands to grab hold of your left knee and pull it gently toward the opposite shoulder. You should feel the stretch in your buttocks and hips. Hold for 15 to 30 seconds. Relax, and then repeat with the other leg. Repeat this cycle 2 to 4 times.   Lie on your back in a doorway, with one leg through the open door. Slide your leg up the wall to straighten your knee. You should feel a gentle stretch down the back of your leg. Hold the stretch for at least 1 minute. As the days go by, add a little more time until you can relax and let these muscles stretch for as much as 6 minutes for each leg. Do not arch your back. Do not bend either knee. Keep one heel touching the floor and the other heel touching the wall. Do not point your toes. Repeat with your other leg. Do 2 to 4 times for each leg. If you do not have a place to do this exercise in a doorway, there is another way to do it:  Lie on your back and bend the knee of the leg you want to stretch. Loop a towel under the ball and toes of that foot, and hold the ends of the towel in your hands. Straighten your knee and slowly pull back on the towel. You should feel a gentle stretch down the back of your leg. It is hard to hold this stretch with a towel for a long time, but hold the stretch for at least 15 to 30 seconds. One minute or more is even better. Repeat with your other leg. Do 2 to 4 times for each leg.   Child's Pose Kneel on the floor with your toes together and your knees hip-width apart. Rest your palms on top of  your thighs. On an exhale, lower your torso between your knees. Extend your arms alongside your torso with your palms facing down. Relax your shoulders toward the ground. Rest in the pose for as long as needed.

## 2021-09-08 NOTE — Telephone Encounter (Signed)
  Chief Complaint: Back pain Symptoms: back pain 9-10/10.  Frequency: Pt has had back pain for a long time. Pain has increased significantly since vacuuming on Friday Pertinent Negatives: Patient denies Neurological issues Disposition: '[]'$ ED /'[]'$ Urgent Care (no appt availability in office) / '[x]'$ Appointment(In office/virtual)/ '[]'$  [place] [facility]/ '[]'$ Home Care/ '[]'$ Refused Recommended Disposition /'[]'$ [facility] [facility]/ '[]'$  Follow-up with PCP Additional Notes: Pt would like cortisone injection. Per agent Dr. Ancil Boozer is only provider that is able to do this.      Summary: lower back pain   Pt stated she can barely move and asked to come in today to get a Cortizone shot for her lower back pain / only provider that may do those is Dr. Ancil Boozer and she is not in the office and does not have an appt until end of the month / please advise      Reason for Disposition  [1] SEVERE back pain (e.g., excruciating, unable to do any normal activities) AND [2] not improved 2 hours after pain medicine  Answer Assessment - Initial Assessment Questions 1. ONSET: "When did the pain begin?"      Gotten worse since Friday - ongoing 2. LOCATION: "Where does it hurt?" (upper, mid or lower back)     lower 3. SEVERITY: "How bad is the pain?"  (e.g., Scale 1-10; mild, moderate, or severe)   - MILD (1-3): doesn't interfere with normal activities    - MODERATE (4-7): interferes with normal activities or awakens from sleep    - SEVERE (8-10): excruciating pain, unable to do any normal activities      9-10/10 4. PATTERN: "Is the pain constant?" (e.g., yes, no; constant, intermittent)      intermittent 5. RADIATION: "Does the pain shoot into your legs or elsewhere?"     Legs giving out, shoots up back 6. CAUSE:  "What do you think is causing the back pain?"      Long term  7. BACK OVERUSE:  "Any recent lifting of heavy objects, strenuous work or exercise?"     vacuuming 8. MEDICATIONS: "What have you taken so  far for the pain?" (e.g., nothing, acetaminophen, NSAIDS)      9. NEUROLOGIC SYMPTOMS: "Do you have any weakness, numbness, or problems with bowel/bladder control?"     no 10. OTHER SYMPTOMS: "Do you have any other symptoms?" (e.g., fever, abdominal pain, burning with urination, blood in urine)        11. PREGNANCY: "Is there any chance you are pregnant?" (e.g., yes, no; LMP)  Protocols used: Back Pain-A-AH

## 2021-09-08 NOTE — Progress Notes (Signed)
   SUBJECTIVE:   CHIEF COMPLAINT / HPI:   BACK PAIN - h/o osteoporosis with prior vertebral compression fracture   Duration: chronic Mechanism of injury: vacuuming Location: bilateral low back Onset: sudden after vacuuming Quality: sharp, dull, achy Frequency: intermittent Radiation: R>L leg Aggravating factors: movement Alleviating factors: sitting still Treatments attempted: tylenol  Relief with NSAIDs?: No NSAIDs Taken Paresthesias / decreased sensation:  no Bowel / bladder incontinence:  no Fevers:  no Dysuria / urinary frequency:  no   OBJECTIVE:   BP 112/64   Pulse 76   Resp 16   Ht '5\' 7"'$  (1.702 m)   Wt 181 lb (82.1 kg)   SpO2 97%   BMI 28.35 kg/m   Gen: well appearing, in NAD. MSK: no midline spinal tenderness. Limited AROM in flexion, extension, b/l sidebending 2/2 pain. TTP over bilateral lumbar paravertebral musculature. No tenderness over piriformis or greater trochanter. 5/5 LE strength. No LE edema.    ASSESSMENT/PLAN:   Acute bilateral low back pain without sciatica Acute on chronic back pain, exacerbated by injury while vacuuming and with spasm on exam. Neurologically intact and without red flags on history or exam. Rx muscle relaxer, NSAID. Stretches provided. Return and emergency precautions discussed.     Myles Gip, DO

## 2021-09-08 NOTE — Assessment & Plan Note (Signed)
Acute on chronic back pain, exacerbated by injury while vacuuming and with spasm on exam. Neurologically intact and without red flags on history or exam. Rx muscle relaxer, NSAID. Stretches provided. Return and emergency precautions discussed.

## 2021-09-08 NOTE — Telephone Encounter (Signed)
Pt stated she can barely move and asked to come in today to get a Cortizone shot for her lower back pain / only provider that may do those is Dr. Ancil Boozer and she is not in the office and does not have an appt until end of the month / please advise   Left message to call back about symptoms.

## 2021-09-08 NOTE — Telephone Encounter (Signed)
2nd attempt, pt called on home and cell number. Left VM on home # and cell # VM full.

## 2021-09-10 DIAGNOSIS — M9902 Segmental and somatic dysfunction of thoracic region: Secondary | ICD-10-CM | POA: Diagnosis not present

## 2021-09-10 DIAGNOSIS — M9903 Segmental and somatic dysfunction of lumbar region: Secondary | ICD-10-CM | POA: Diagnosis not present

## 2021-09-10 DIAGNOSIS — M5136 Other intervertebral disc degeneration, lumbar region: Secondary | ICD-10-CM | POA: Diagnosis not present

## 2021-09-10 DIAGNOSIS — M546 Pain in thoracic spine: Secondary | ICD-10-CM | POA: Diagnosis not present

## 2021-09-13 DIAGNOSIS — M546 Pain in thoracic spine: Secondary | ICD-10-CM | POA: Diagnosis not present

## 2021-09-13 DIAGNOSIS — M9902 Segmental and somatic dysfunction of thoracic region: Secondary | ICD-10-CM | POA: Diagnosis not present

## 2021-09-13 DIAGNOSIS — M9903 Segmental and somatic dysfunction of lumbar region: Secondary | ICD-10-CM | POA: Diagnosis not present

## 2021-09-13 DIAGNOSIS — M5136 Other intervertebral disc degeneration, lumbar region: Secondary | ICD-10-CM | POA: Diagnosis not present

## 2021-09-15 DIAGNOSIS — M5136 Other intervertebral disc degeneration, lumbar region: Secondary | ICD-10-CM | POA: Diagnosis not present

## 2021-09-15 DIAGNOSIS — M546 Pain in thoracic spine: Secondary | ICD-10-CM | POA: Diagnosis not present

## 2021-09-15 DIAGNOSIS — M9902 Segmental and somatic dysfunction of thoracic region: Secondary | ICD-10-CM | POA: Diagnosis not present

## 2021-09-15 DIAGNOSIS — M9903 Segmental and somatic dysfunction of lumbar region: Secondary | ICD-10-CM | POA: Diagnosis not present

## 2021-09-20 DIAGNOSIS — M9903 Segmental and somatic dysfunction of lumbar region: Secondary | ICD-10-CM | POA: Diagnosis not present

## 2021-09-20 DIAGNOSIS — M546 Pain in thoracic spine: Secondary | ICD-10-CM | POA: Diagnosis not present

## 2021-09-20 DIAGNOSIS — M5136 Other intervertebral disc degeneration, lumbar region: Secondary | ICD-10-CM | POA: Diagnosis not present

## 2021-09-20 DIAGNOSIS — M9902 Segmental and somatic dysfunction of thoracic region: Secondary | ICD-10-CM | POA: Diagnosis not present

## 2021-09-27 DIAGNOSIS — M5136 Other intervertebral disc degeneration, lumbar region: Secondary | ICD-10-CM | POA: Diagnosis not present

## 2021-09-27 DIAGNOSIS — M546 Pain in thoracic spine: Secondary | ICD-10-CM | POA: Diagnosis not present

## 2021-09-27 DIAGNOSIS — M9902 Segmental and somatic dysfunction of thoracic region: Secondary | ICD-10-CM | POA: Diagnosis not present

## 2021-09-27 DIAGNOSIS — M9903 Segmental and somatic dysfunction of lumbar region: Secondary | ICD-10-CM | POA: Diagnosis not present

## 2021-10-11 ENCOUNTER — Ambulatory Visit: Payer: PPO | Admitting: Podiatry

## 2021-10-11 DIAGNOSIS — M722 Plantar fascial fibromatosis: Secondary | ICD-10-CM

## 2021-10-11 NOTE — Patient Instructions (Signed)

## 2021-10-11 NOTE — Progress Notes (Signed)
  Subjective:  Patient ID: Jacqueline Kaiser, female    DOB: 01/19/1946,  MRN: [id number]  Persistent pain in heels  76 y.o. female presents with the above complaint. History confirmed with patient.  Pain has returned, she did not have an injection after the last visit and is bothersome again  Objective:  Physical Exam: warm, good capillary refill, no trophic changes or ulcerative lesions, normal DP and PT pulses, and normal sensory exam. Left Foot: point tenderness over the heel pad Right Foot: point tenderness over the heel pad  Assessment:   1. Plantar fasciitis, right   2. Plantar fasciitis, left      Plan:  Patient was evaluated and treated and all questions answered.  Discussed the etiology and treatment options for plantar fasciitis including stretching, formal physical therapy, supportive shoegears such as a running shoe or sneaker, pre fabricated orthoses, injection therapy, and oral medications. We also discussed the role of surgical treatment of this for patients who do not improve after exhausting non-surgical treatment options.   -Home physical therapy recommended, exercise plan given -Repeat injection given to both heels -Discussed with her if not improved by next visit would recommend an MRI to evaluate  After sterile prep with povidone-iodine solution and alcohol, the bilateral heel was injected with 0.5cc 2% xylocaine plain, 0.5cc 0.5% marcaine plain, '5mg'$  triamcinolone acetonide, and '2mg'$  dexamethasone was injected along the medial plantar fascia at the insertion on the plantar calcaneus. The patient tolerated the procedure well without complication.    Return in about 6 weeks (around 11/22/2021).

## 2021-11-23 ENCOUNTER — Ambulatory Visit: Payer: PPO | Admitting: Podiatry

## 2021-11-23 DIAGNOSIS — M722 Plantar fascial fibromatosis: Secondary | ICD-10-CM

## 2021-11-23 MED ORDER — BETAMETHASONE SOD PHOS & ACET 6 (3-3) MG/ML IJ SUSP
3.0000 mg | Freq: Once | INTRAMUSCULAR | Status: AC
  Administered 2021-11-23: 3 mg via INTRA_ARTICULAR

## 2021-11-23 MED ORDER — MELOXICAM 15 MG PO TABS: 15.0000 mg | ORAL_TABLET | Freq: Every day | ORAL | 1 refills | Status: DC

## 2021-11-23 MED ORDER — METHYLPREDNISOLONE 4 MG PO TBPK: ORAL_TABLET | ORAL | 0 refills | Status: DC

## 2021-11-23 NOTE — Progress Notes (Signed)
   Chief Complaint  Patient presents with   Follow-up    Patient is here for follow-up bilateral plantar fasciitis.     Subjective: 76 y.o. female presenting today for follow-up evaluation of plantar fasciitis to the bilateral feet.  Patient states that in the left foot is feeling well.  She continues to have pain and tenderness to the right heel.  Currently she is not taking anything orally for treatment or management.  She presents for further treatment and evaluation   Past Medical History:  Diagnosis Date   Allergy    Ankle fracture 2014   Left   Anxiety    Female cystocele    Grade 2   Fracture    lower back after sneezing   GERD (gastroesophageal reflux disease)    History of   History of vertigo    last attack about 1 week ago   Hyperlipidemia    Insomnia    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   Right, .4 x 1.3 cm, noted on [place] ABD 05/2016, left lobe of the liver with the largest measuring 9.9 mm in diameter [place] ABD 06/2010   Low back sprain    Nonsustained ventricular tachycardia ([place])    by Holter,  noraml Stress test ECHO perpatient [name], [place])   Obese    Osteoporosis    Sinusitis    Urinary frequency    Wrist fracture    Left     Objective: Physical Exam General: The patient is alert and oriented x3 in no acute distress.  Dermatology: Skin is warm, dry and supple bilateral lower extremities. Negative for open lesions or macerations bilateral.   Vascular: Dorsalis Pedis and Posterior Tibial pulses palpable bilateral.  Capillary fill time is immediate to all digits.  Neurological: Epicritic and protective threshold intact bilateral.   Musculoskeletal: Tenderness to palpation to the plantar aspect of the right heel along the plantar fascia. All other joints range of motion within normal limits bilateral. Strength 5/5 in all groups bilateral.    Assessment: 1. Plantar fasciitis right  Plan of Care:  1. Patient evaluated.   2. Injection of 0.5cc Celestone soluspan  injected into the right plantar fascia  3. Rx for Medrol Dose Pack placed 4. Rx for Meloxicam ordered for patient. 5.  Continue wearing OTC power step insoles 6. Instructed patient regarding therapies and modalities at home to alleviate symptoms.  7. Return to clinic in 4 weeks.    *Works at [employer] @ [place] M. Marcoantonio Legault, DPM [facility]  Dr. Edrick Kins, DPM    [address], [place] [address]                Office [phone]  Fax [phone]

## 2021-12-07 ENCOUNTER — Encounter: Payer: Self-pay | Admitting: Internal Medicine

## 2021-12-07 ENCOUNTER — Ambulatory Visit: Payer: Self-pay | Admitting: *Deleted

## 2021-12-07 ENCOUNTER — Telehealth (INDEPENDENT_AMBULATORY_CARE_PROVIDER_SITE_OTHER): Payer: PPO | Admitting: Internal Medicine

## 2021-12-07 DIAGNOSIS — U071 COVID-19: Secondary | ICD-10-CM | POA: Diagnosis not present

## 2021-12-07 MED ORDER — NIRMATRELVIR/RITONAVIR (PAXLOVID)TABLET: 3.0000 | ORAL_TABLET | Freq: Two times a day (BID) | ORAL | 0 refills | Status: AC

## 2021-12-07 MED ORDER — PULSE OXIMETER MISC: 1.0000 | 0 refills | Status: DC | PRN

## 2021-12-07 MED ORDER — BENZONATATE 100 MG PO CAPS: 100.0000 mg | ORAL_CAPSULE | Freq: Two times a day (BID) | ORAL | 0 refills | Status: DC | PRN

## 2021-12-07 MED ORDER — FLUTICASONE PROPIONATE 50 MCG/ACT NA SUSP: 2.0000 | Freq: Every day | NASAL | 6 refills | Status: DC

## 2021-12-07 NOTE — Progress Notes (Signed)
Virtual Visit via Video Note  I connected with Jacqueline Kaiser on 12/07/21 at  3:40 PM EDT by a video enabled telemedicine application and verified that I am speaking with the correct person using two identifiers.  Location: Patient: Home Provider: [facility]   I discussed the limitations of evaluation and management by telemedicine and the availability of in person appointments. The patient expressed understanding and agreed to proceed.  History of Present Illness:  Patient is presenting for positive COVID test earlier today. Symptoms started 2 days ago on 12/05/2021.  Her husband started having symptoms a few days ago.Marland Kitchen  COVID: -Worst symptom: Myalgias -Fever: no, 99.8 -Cough: yes dry -Shortness of breath: no -Wheezing: no -Chest pain: no -Chest tightness: no -Chest congestion: no -Nasal congestion: no -Runny nose: yes -Post nasal drip: no -Sneezing: yes -Sore throat: no -Headache: yes -Ear pain: no  -Ear pressure: no  -Fatigue: yes -Sick contacts: yes -Context: stable -Relief with OTC cold/cough medications: Somewhat -Treatments attempted: Coricidin -Has had all COVID vaccines and boosters    Observations/Objective:  General: well appearing, no acute distress ENT: conjunctiva normal appearing bilaterally  Skin: no rashes, cyanosis or abnormal bruising noted Neuro: answers all questions appropriately   Assessment and Plan:  1. COVID-19: Home positive COVID test earlier today, symptoms started 2 days ago.  Will treat with antiviral medication.  Patient will hold Seroquel while on Paxlovid.  Otherwise treat symptomatically with cough suppressants and nasal steroids.  Recommend patient obtain pulse oximeter to monitor blood oxygen at home.  Otherwise patient will rest and continue to stay well-hydrated and follow-up if symptoms worsen or fail to improve.  Discussed receiving new booster 3 months from current infection resolution.  - nirmatrelvir/ritonavir EUA (PAXLOVID) 20 x  150 MG & 10 x '100MG'$  TABS; Take 3 tablets by mouth 2 (two) times daily for 5 days. (Take nirmatrelvir 150 mg two tablets twice daily for 5 days and ritonavir 100 mg one tablet twice daily for 5 days) Patient GFR is 73.  Dispense: 30 tablet; Refill: 0 - benzonatate (TESSALON) 100 MG capsule; Take 1 capsule (100 mg total) by mouth 2 (two) times daily as needed for cough.  Dispense: 20 capsule; Refill: 0 - fluticasone (FLONASE) 50 MCG/ACT nasal spray; Place 2 sprays into both nostrils daily.  Dispense: 16 g; Refill: 6 - Misc. Devices (PULSE OXIMETER) MISC; 1 each by Does not apply route as needed (shorntess of breath).  Dispense: 1 each; Refill: 0   Follow Up Instructions: As needed or if symptoms worsen or fail to improve.    I discussed the assessment and treatment plan with the patient. The patient was provided an opportunity to ask questions and all were answered. The patient agreed with the plan and demonstrated an understanding of the instructions.   The patient was advised to call back or seek an in-person evaluation if the symptoms worsen or if the condition fails to improve as anticipated.  I provided 13 minutes of non-face-to-face time during this encounter.   Teodora Medici, DO

## 2021-12-07 NOTE — Telephone Encounter (Signed)
Summary: covid and medication   Pt tested positive for covid today/ symptoms started two days ago/pt has cough, congestion, feels light headed / slight fever. Feel sweaty / pt asked if any medication can be called in for her for covid or symptoms       Chief Complaint: positive COVID Symptoms: dizziness, cough,headache and fatigue Frequency: since Sunday Pertinent Negatives: Patient denies fever Disposition: '[]'$ ED /'[]'$ Urgent Care (no appt availability in office) / '[x]'$ Appointment(In office/virtual)/ '[]'$  [place] [facility]/ '[]'$ Home Care/ '[]'$ Refused Recommended Disposition /'[]'$ [facility] [facility]/ '[]'$  Follow-up with PCP Additional Notes: Virtual appt this afternoon with Dr. Ancil Boozer, pt's husband was waiting on test while on phone and he is also positive.  Reason for Disposition  [1] HIGH RISK patient (e.g., weak immune system, age > 26 years, obesity with BMI 30 or higher, pregnant, chronic lung disease or other chronic medical condition) AND [2] COVID symptoms (e.g., cough, fever)  (Exceptions: Already seen by PCP and no new or worsening symptoms.)  Answer Assessment - Initial Assessment Questions 1. COVID-19 DIAGNOSIS: "How do you know that you have COVID?" (e.g., positive lab test or self-test, diagnosed by doctor or NP/PA, symptoms after exposure).     Home test 2. COVID-19 EXPOSURE: "Was there any known exposure to COVID before the symptoms began?" CDC Definition of close contact: within 6 feet (2 meters) for a total of 15 minutes or more over a 24-hour period.      husband 3. ONSET: "When did the COVID-19 symptoms start?"      2 days ago 4. WORST SYMPTOM: "What is your worst symptom?" (e.g., cough, fever, shortness of breath, muscle aches)     Dizziness, sneezing and coughing 5. COUGH: "Do you have a cough?" If Yes, ask: "How bad is the cough?"       Cough, taking robitussin 6. FEVER: "Do you have a fever?" If Yes, ask: "What is your temperature, how was it measured, and when did it  start?"     unknown 7. RESPIRATORY STATUS: "Describe your breathing?" (e.g., normal; shortness of breath, wheezing, unable to speak)      no 8. BETTER-SAME-WORSE: "Are you getting better, staying the same or getting worse compared to yesterday?"  If getting worse, ask, "In what way?"     Better except dizziness 9. OTHER SYMPTOMS: "Do you have any other symptoms?"  (e.g., chills, fatigue, headache, loss of smell or taste, muscle pain, sore throat)     headache 10. HIGH RISK DISEASE: "Do you have any chronic medical problems?" (e.g., asthma, heart or lung disease, weak immune system, obesity, etc.)       no 11. VACCINE: "Have you had the COVID-19 vaccine?" If Yes, ask: "Which one, how many shots, when did you get it?"       Yes, all the vaccines, Pfizer 12. PREGNANCY: "Is there any chance you are pregnant?" "When was your last menstrual period?"       no 13. O2 SATURATION MONITOR:  "Do you use an oxygen saturation monitor (pulse oximeter) at home?" If Yes, ask "What is your reading (oxygen level) today?" "What is your usual oxygen saturation reading?" (e.g., 95%)       no  Protocols used: Coronavirus (COVID-19) Diagnosed or Suspected-A-AH

## 2021-12-07 NOTE — Telephone Encounter (Signed)
Patient called back requesting clarification on My Chart VV scheduled for tomorrow instead of today. Patient scheduled for a my chart VV today . Called practice to confirm due to multiple calls to patient.   Reason for Disposition  [1] Follow-up call to recent contact AND [2] information only call, no triage required  Answer Assessment - Initial Assessment Questions 1. REASON FOR CALL or QUESTION: "What is your reason for calling today?" or "How can I best help you?" or "What question do you have that I can help answer?"     Requesting clarification of appt.  Protocols used: Information Only Call - No Triage-A-AH

## 2021-12-07 NOTE — Progress Notes (Deleted)
Name: Jacqueline Kaiser   MRN: [id number]    DOB: 11-07-1945   Date:12/07/2021       Progress Note  Subjective  Chief Complaint  Covid Positive  I connected with  Jacqueline Kaiser  on 12/07/21 at  3:00 PM EDT by a video enabled telemedicine application and verified that I am speaking with the correct person using two identifiers.  I discussed the limitations of evaluation and management by telemedicine and the availability of in person appointments. The patient expressed understanding and agreed to proceed with the virtual visit  Staff also discussed with the patient that there may be a patient responsible charge related to this service. Patient Location: *** Provider Location: *** Additional Individuals present: ***  HPI  *** Patient Active Problem List   Diagnosis Date Noted   Acute bilateral low back pain without sciatica 09/08/2021   Coccyalgia 01/11/2021   Cystocele with prolapse 06/19/2018   Age-related osteoporosis without current pathological fracture 01/08/2018   History of vertebral compression fracture 05/23/2017   Incomplete bladder emptying 05/16/2016   GERD (gastroesophageal reflux disease) 02/14/2016   Insomnia due to anxiety and fear 02/14/2016   Allergy 07/01/2013   Osteoporosis of lumbar spine 05/19/2013   Overweight (BMI 25.0-29.9) 08/28/2011   Peripheral vertigo 08/28/2011   Nonsustained ventricular tachycardia ([place])    Hyperlipidemia     Past Surgical History:  Procedure Laterality Date   ABDOMINAL HYSTERECTOMY     BREAST BIOPSY Right    negative 10-12 years ago- core   CESAREAN SECTION     COLONOSCOPY  2012   CYSTOCELE REPAIR N/A 06/19/2018   Procedure: CYSTOSCOPY ANTERIOR REPAIR (CYSTOCELE);  Surgeon: Bjorn Loser, MD;  Location: [facility];  Service: Urology;  Laterality: N/A;   TONSILLECTOMY      Family History  Problem Relation Age of Onset   Heart disease Mother        ATRIAL FIB   Cancer Father 20       Lung Ca,  nonsmoker, metastatic at  diagnosis  asbestos exposure   Breast cancer Daughter 22       dx at age of 9 and 41    Social History   Socioeconomic History   Marital status: Married    Spouse name: Jacqueline Kaiser   Number of children: 2   Years of education: Not on file   Highest education level: 12th grade  Occupational History   Occupation: Retired  Tobacco Use   Smoking status: Former    Packs/day: 0.50    Years: 10.00    Total pack years: 5.00    Types: Cigarettes    Quit date: 04/04/1978    Years since quitting: 43.7   Smokeless tobacco: Never   Tobacco comments:    smoking cessation materials not required  Vaping Use   Vaping Use: Never used  Substance and Sexual Activity   Alcohol use: Yes    Comment: wine occasionally   Drug use: No   Sexual activity: Not Currently    Birth control/protection: Surgical  Other Topics Concern   Not on file  Social History Narrative   Works part time at [employer] Strain: [place]  (01/12/2021)   Overall Financial Resource Strain (CARDIA)    Difficulty of Paying Living Expenses: Not hard at all  Food Insecurity: No Food Insecurity (01/12/2021)   Hunger Vital Sign    Worried About Running Out of Food in the Last Year:  Never true    Ran Out of Food in the Last Year: Never true  Transportation Needs: No Transportation Needs (01/12/2021)   PRAPARE - Hydrologist (Medical): No    Lack of Transportation (Non-Medical): No  Physical Activity: Insufficiently Active (01/12/2021)   Exercise Vital Sign    Days of Exercise per Week: 2 days    Minutes of Exercise per Session: 30 min  Stress: No Stress Concern Present (01/12/2021)   [place]    Feeling of Stress : Not at all  Social Connections: Moderately Integrated (01/12/2021)   Social Connection and Isolation Panel [NHANES]    Frequency of Communication with Friends and  Family: More than three times a week    Frequency of Social Gatherings with Friends and Family: More than three times a week    Attends Religious Services: More than 4 times per year    Active Member of [employer] or Organizations: No    Attends Archivist Meetings: Never    Marital Status: Married  Human resources officer Violence: Not At Risk (01/12/2021)   Humiliation, Afraid, Rape, and Kick questionnaire    Fear of Current or Ex-Partner: No    Emotionally Abused: No    Physically Abused: No    Sexually Abused: No     Current Outpatient Medications:    Ascorbic Acid (VITAMIN C) 100 MG tablet, Take 100 mg by mouth daily., Disp: , Rfl:    calcium carbonate (OS-CAL) 600 MG TABS tablet, Take by mouth., Disp: , Rfl:    cholecalciferol (VITAMIN D3) 25 MCG (1000 UT) tablet, Take 1,000 Units by mouth daily., Disp: , Rfl:    denosumab (PROLIA) 60 MG/ML SOSY injection, Inject 1 mL into the skin every 6 (six) months., Disp: , Rfl:    meclizine (ANTIVERT) 12.5 MG tablet, Take 1 tablet (12.5 mg total) by mouth 4 (four) times daily as needed for dizziness., Disp: 30 tablet, Rfl: 0   meloxicam (MOBIC) 15 MG tablet, Take 1 tablet (15 mg total) by mouth daily., Disp: 30 tablet, Rfl: 1   methylPREDNISolone (MEDROL DOSEPAK) 4 MG TBPK tablet, 6 day dose pack - take as directed, Disp: 21 tablet, Rfl: 0   omeprazole (PRILOSEC) 20 MG capsule, Take 20 mg by mouth daily., Disp: , Rfl:    polyethylene glycol powder (GLYCOLAX/MIRALAX) 17 GM/SCOOP powder, Take 17 g by mouth once. PRN, Disp: , Rfl:    QUEtiapine (SEROQUEL) 25 MG tablet, Take 1 tablet (25 mg total) by mouth at bedtime., Disp: 90 tablet, Rfl: 1   rosuvastatin (CRESTOR) 20 MG tablet, Take 1 tablet (20 mg total) by mouth every evening., Disp: 90 tablet, Rfl: 1   tiZANidine (ZANAFLEX) 4 MG tablet, Take 1 tablet (4 mg total) by mouth every 6 (six) hours as needed for muscle spasms., Disp: 30 tablet, Rfl: 0   vitamin B-12 (CYANOCOBALAMIN) 500 MCG tablet,  Take 500 mcg by mouth daily., Disp: , Rfl:   Current Facility-Administered Medications:    betamethasone acetate-betamethasone sodium phosphate (CELESTONE) injection 3 mg, 3 mg, Intra-articular, Once, Amalia Hailey, Dorathy Daft, DPM  Allergies  Allergen Reactions   Betadine [Povidone Iodine] Other (See Comments)    Burns   Iodine     I personally reviewed active problem list, medication list, allergies, family history, social history, health maintenance with the patient/caregiver today.   ROS ***  Objective  Virtual encounter, vitals not obtained.  There is no height or weight  on file to calculate BMI.  Physical Exam ***  No results found for this or any previous visit (from the past 72 hour(s)).  PHQ2/9:    09/08/2021   10:42 AM 07/05/2021    7:37 AM 01/12/2021    8:51 AM 01/11/2021   10:10 AM 01/04/2021    9:29 AM  Depression screen PHQ 2/9  Decreased Interest 0 0 0 0 0  Down, Depressed, Hopeless 0 0 0 0 0  PHQ - 2 Score 0 0 0 0 0  Altered sleeping  0 0 0   Tired, decreased energy  0 0 0   Change in appetite  0 0 0   Feeling bad or failure about yourself   0 0 0   Trouble concentrating  0 0 0   Moving slowly or fidgety/restless  0 0 0   Suicidal thoughts  0 0 0   PHQ-9 Score  0 0 0   Difficult doing work/chores   Not difficult at all Not difficult at all    PHQ-2/9 Result is {gen negative/positive:315881}.    Fall Risk:    09/08/2021   10:42 AM 07/05/2021    7:37 AM 01/12/2021    8:53 AM 01/11/2021   10:10 AM 01/04/2021    9:29 AM  Fall Risk   Falls in the past year? 0 0 0 0 0  Number falls in past yr: 0 0 0 0 0  Injury with Fall? 0 0 0 0 0  Risk for fall due to : No Fall Risks No Fall Risks No Fall Risks  No Fall Risks  Follow up Falls prevention discussed Falls prevention discussed Falls prevention discussed  Falls prevention discussed     Assessment & Plan  There are no diagnoses linked to this encounter.  I discussed the assessment and treatment plan with the  patient. The patient was provided an opportunity to ask questions and all were answered. The patient agreed with the plan and demonstrated an understanding of the instructions.  The patient was advised to call back or seek an in-person evaluation if the symptoms worsen or if the condition fails to improve as anticipated.  I provided *** minutes of non-face-to-face time during this encounter.

## 2021-12-08 ENCOUNTER — Telehealth: Payer: PPO | Admitting: Family Medicine

## 2021-12-20 DIAGNOSIS — M810 Age-related osteoporosis without current pathological fracture: Secondary | ICD-10-CM | POA: Diagnosis not present

## 2021-12-24 ENCOUNTER — Ambulatory Visit: Payer: PPO | Admitting: Podiatry

## 2021-12-24 DIAGNOSIS — M722 Plantar fascial fibromatosis: Secondary | ICD-10-CM | POA: Diagnosis not present

## 2021-12-24 MED ORDER — MELOXICAM 15 MG PO TABS
15.0000 mg | ORAL_TABLET | Freq: Every day | ORAL | 1 refills | Status: DC
Start: 2021-12-24 — End: 2022-07-04

## 2021-12-24 MED ORDER — BETAMETHASONE SOD PHOS & ACET 6 (3-3) MG/ML IJ SUSP
3.0000 mg | Freq: Once | INTRAMUSCULAR | Status: AC
  Administered 2021-12-24: 3 mg via INTRA_ARTICULAR

## 2021-12-24 NOTE — Progress Notes (Signed)
Chief Complaint  Patient presents with   Plantar Fasciitis    Patient is here for plantar fasciitis follow-up for rt foot, patient stated that the heel is better but the achilles is driving her nuts.    Subjective: 76 y.o. female presenting today for follow-up evaluation of plantar fasciitis to the bilateral feet.  Patient can use to feel well on her left foot.  She continues to have some mild to moderate pain and tenderness to the right plantar heel.  She has also recently noticed an increase of pain and tenderness to the posterior leg   Past Medical History:  Diagnosis Date   Allergy    Ankle fracture 2014   Left   Anxiety    Female cystocele    Grade 2   Fracture    lower back after sneezing   GERD (gastroesophageal reflux disease)    History of   History of vertigo    last attack about 1 week ago   Hyperlipidemia    Insomnia    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   Right, .4 x 1.3 cm, noted on [place] ABD 05/2016, left lobe of the liver with the largest measuring 9.9 mm in diameter [place] ABD 06/2010   Low back sprain    Nonsustained ventricular tachycardia ([place])    by Holter,  noraml Stress test ECHO perpatient [name], [place])   Obese    Osteoporosis    Sinusitis    Urinary frequency    Wrist fracture    Left     Objective: Physical Exam General: The patient is alert and oriented x3 in no acute distress.  Dermatology: Skin is warm, dry and supple bilateral lower extremities. Negative for open lesions or macerations bilateral.   Vascular: Dorsalis Pedis and Posterior Tibial pulses palpable bilateral.  Capillary fill time is immediate to all digits.  Neurological: Epicritic and protective threshold intact bilateral.   Musculoskeletal: Tenderness to palpation to the plantar aspect of the right heel along the plantar fascia.  There is some pain and tenderness associated to the right Achilles tendon as it inserts on the posterior tubercle of the calcaneus.  All other joints range of motion  within normal limits bilateral. Strength 5/5 in all groups bilateral.    Assessment: 1. Plantar fasciitis right 2.  Mild Achilles tendinitis right  Plan of Care:  1. Patient evaluated.   2. Injection of 0.5cc Celestone soluspan injected into the right plantar fascia  3.  Refill prescription for meloxicam 15 mg daily 4.  Today we did discuss different additional treatment modalities including physical therapy and custom molded orthotics.  The patient declined both for now.  Continue OTC arch supports and physical therapy exercises at home which she does daily 5.  Also discussed possibility of surgery if the patient continues to have pain and tenderness associated to the right plantar heel.  Unfortunately she continues to have pain and tenderness to the right plantar heel and now posterior Achilles tendon for approximately 1 year now despite conservative treatment modalities. 6.  Return to clinic 6 weeks  *Works at [employer] @ [place] M. Ayano [place], [place] [facility]  Dr. Edrick Kins, DPM    [address], [place] [address]  Office [phone]  Fax [phone]

## 2021-12-31 ENCOUNTER — Other Ambulatory Visit: Payer: Self-pay | Admitting: Family Medicine

## 2021-12-31 DIAGNOSIS — E7800 Pure hypercholesterolemia, unspecified: Secondary | ICD-10-CM

## 2022-01-03 NOTE — Progress Notes (Signed)
Name: Jacqueline Kaiser   MRN: [id number]    DOB: [birth year]   Date:01/04/2022       Progress Note  Subjective  Chief Complaint  Follow Up  HPI  Osteoporosis history of vertebral compression fracture:  She is still getting Prolia with Dr. Gabriel Carina and denies side effects of medication.  Last infusion was 09/23 , goes twice a year, continue vitamin D supplementation    Chronic lumbar spine pain: she states it has been going on for years, however fall of 2018 pain was severe and did not improve with chiropractor care, she was sent to [place] and found to have radiculitis but she is doing well now  She states pain now is intermittent , no problems when walking or standing anymore She goes to chiropractor , Dr. Freddi Che every 2 weeks for adjustment    GERD: she states symptoms are controlled taking it prn only when she knows she will splurge. She also states has intermittently, however a couple of weeks ago had to take it for a few days, feeling well now   CKI : she used to have stage III but labs have been normal for a while., she is taking Meloxicam given by Dr. Amalia Hailey and we will recheck labs today    NSVT: she states diagnosed years ago by a cardiologist, no chest pain or palpation, she denies any symptoms. Denies decrease in exercise tolerance. She denies any palpitation, feeling well and never took medication for it    Hyperlipidemia: taking Crestor, denies myalgia, chest pain or palpitation. Last LDL was 123 , she wants to continue current dose and resume physical activity . We will recheck labs today    Insomnia: she states she falls asleep without problems, but she was waking up  in the middle of the night but since started on Seroquel she is able to go back to bed., she likes the medication and needs a refill  No side effects   Coccyalgia: seen by Ortho Fall 2022  now doing well, not taking medications Unchanged  Vertigo: she had one episode of vertigo last week, she states it  happens intermittently, she takes meclizine prn, a few times a year She had some dizziness during the recent episode of COVID-19   Plantar Fascitis: symptoms started Spring 2023, she is seeing Dr. Amalia Hailey, had steroid injections on both feet, also medrol dose pack and meloxicam, doing stretches at home, left foot symptoms resolved but still has pain on right foot   Patient Active Problem List   Diagnosis Date Noted   Acute bilateral low back pain without sciatica 09/08/2021   Coccyalgia 01/11/2021   Cystocele with prolapse 06/19/2018   Age-related osteoporosis without current pathological fracture 01/08/2018   History of vertebral compression fracture 05/23/2017   Incomplete bladder emptying 05/16/2016   GERD (gastroesophageal reflux disease) 02/14/2016   Insomnia due to anxiety and fear 02/14/2016   Allergy 07/01/2013   Osteoporosis of lumbar spine 05/19/2013   Overweight (BMI 25.0-29.9) 08/28/2011   Peripheral vertigo 08/28/2011   Nonsustained ventricular tachycardia ([place])    Hyperlipidemia     Past Surgical History:  Procedure Laterality Date   ABDOMINAL HYSTERECTOMY     BREAST BIOPSY Right    negative 10-12 years ago- core   CESAREAN SECTION     COLONOSCOPY  2012   CYSTOCELE REPAIR N/A 06/19/2018   Procedure: CYSTOSCOPY ANTERIOR REPAIR (CYSTOCELE);  Surgeon: Bjorn Loser, MD;  Location: [facility];  Service: Urology;  Laterality: N/A;  TONSILLECTOMY      Family History  Problem Relation Age of Onset   Heart disease Mother        ATRIAL FIB   Cancer Father 41       Lung Ca,  nonsmoker, metastatic at diagnosis  asbestos exposure   Breast cancer Daughter 68       dx at age of 1 and 65    Social History   Tobacco Use   Smoking status: Former    Packs/day: 0.50    Years: 10.00    Total pack years: 5.00    Types: Cigarettes    Quit date: 04/04/1978    Years since quitting: 43.7   Smokeless tobacco: Never   Tobacco comments:    smoking cessation materials not  required  Substance Use Topics   Alcohol use: Yes    Comment: wine occasionally     Current Outpatient Medications:    Ascorbic Acid (VITAMIN C) 100 MG tablet, Take 100 mg by mouth daily., Disp: , Rfl:    benzonatate (TESSALON) 100 MG capsule, Take 1 capsule (100 mg total) by mouth 2 (two) times daily as needed for cough., Disp: 20 capsule, Rfl: 0   calcium carbonate (OS-CAL) 600 MG TABS tablet, Take by mouth., Disp: , Rfl:    cholecalciferol (VITAMIN D3) 25 MCG (1000 UT) tablet, Take 1,000 Units by mouth daily., Disp: , Rfl:    denosumab (PROLIA) 60 MG/ML SOSY injection, Inject 1 mL into the skin every 6 (six) months., Disp: , Rfl:    fluticasone (FLONASE) 50 MCG/ACT nasal spray, Place 2 sprays into both nostrils daily., Disp: 16 g, Rfl: 6   meclizine (ANTIVERT) 12.5 MG tablet, Take 1 tablet (12.5 mg total) by mouth 4 (four) times daily as needed for dizziness., Disp: 30 tablet, Rfl: 0   meloxicam (MOBIC) 15 MG tablet, Take 1 tablet (15 mg total) by mouth daily., Disp: 30 tablet, Rfl: 1   methylPREDNISolone (MEDROL DOSEPAK) 4 MG TBPK tablet, 6 day dose pack - take as directed, Disp: 21 tablet, Rfl: 0   Misc. Devices (PULSE OXIMETER) MISC, 1 each by Does not apply route as needed (shorntess of breath)., Disp: 1 each, Rfl: 0   omeprazole (PRILOSEC) 20 MG capsule, Take 20 mg by mouth daily., Disp: , Rfl:    polyethylene glycol powder (GLYCOLAX/MIRALAX) 17 GM/SCOOP powder, Take 17 g by mouth once. PRN, Disp: , Rfl:    rosuvastatin (CRESTOR) 20 MG tablet, TAKE 1 TABLET BY MOUTH EVERY EVENING, Disp: 90 tablet, Rfl: 1   tiZANidine (ZANAFLEX) 4 MG tablet, Take 1 tablet (4 mg total) by mouth every 6 (six) hours as needed for muscle spasms., Disp: 30 tablet, Rfl: 0   vitamin B-12 (CYANOCOBALAMIN) 500 MCG tablet, Take 500 mcg by mouth daily., Disp: , Rfl:   Current Facility-Administered Medications:    betamethasone acetate-betamethasone sodium phosphate (CELESTONE) injection 3 mg, 3 mg,  Intra-articular, Once, Amalia Hailey, Dorathy Daft, DPM  Allergies  Allergen Reactions   Betadine [Povidone Iodine] Other (See Comments)    Burns   Iodine     I personally reviewed active problem list, medication list, allergies, family history, social history, health maintenance with the patient/caregiver today.   ROS  Constitutional: Negative for fever or weight change.  Respiratory: Negative for cough and shortness of breath.   Cardiovascular: Negative for chest pain or palpitations.  Gastrointestinal: Negative for abdominal pain, no bowel changes.  Musculoskeletal: Negative for gait problem or joint swelling.  Skin: Negative for rash.  Neurological: Negative for dizziness or headache.  No other specific complaints in a complete review of systems (except as listed in HPI above).   Objective  Vitals:   01/04/22 0819  BP: 130/72  Pulse: 76  Resp: 16  SpO2: 97%  Weight: 183 lb (83 kg)  Height: '5\' 7"'$  (1.702 m)    Body mass index is 28.66 kg/m.  Physical Exam  Constitutional: Patient appears well-developed and well-nourished.  No distress.  HEENT: head atraumatic, normocephalic, pupils equal and reactive to light, neck supple Cardiovascular: Normal rate, regular rhythm and normal heart sounds.  No murmur heard. No BLE edema. Pulmonary/Chest: Effort normal and breath sounds normal. No respiratory distress. Abdominal: Soft.  There is no tenderness. Psychiatric: Patient has a normal mood and affect. behavior is normal. Judgment and thought content normal.   PHQ2/9:    01/04/2022    8:17 AM 12/07/2021    3:26 PM 09/08/2021   10:42 AM 07/05/2021    7:37 AM 01/12/2021    8:51 AM  Depression screen PHQ 2/9  Decreased Interest 0 0 0 0 0  Down, Depressed, Hopeless 0 0 0 0 0  PHQ - 2 Score 0 0 0 0 0  Altered sleeping 0 0  0 0  Tired, decreased energy 0 0  0 0  Change in appetite 0 0  0 0  Feeling bad or failure about yourself  0 0  0 0  Trouble concentrating 0 0  0 0  Moving slowly or  fidgety/restless 0 0  0 0  Suicidal thoughts 0 0  0 0  PHQ-9 Score 0 0  0 0  Difficult doing work/chores  Not difficult at all   Not difficult at all    phq 9 is negative   Fall Risk:    01/04/2022    8:17 AM 12/07/2021    3:26 PM 09/08/2021   10:42 AM 07/05/2021    7:37 AM 01/12/2021    8:53 AM  Fall Risk   Falls in the past year? 0 0 0 0 0  Number falls in past yr: 0 0 0 0 0  Injury with Fall? 0 0 0 0 0  Risk for fall due to : No Fall Risks  No Fall Risks No Fall Risks No Fall Risks  Follow up Falls prevention discussed  Falls prevention discussed Falls prevention discussed Falls prevention discussed      Functional Status Survey: Is the patient deaf or have difficulty hearing?: No Does the patient have difficulty seeing, even when wearing glasses/contacts?: No Does the patient have difficulty concentrating, remembering, or making decisions?: No Does the patient have difficulty walking or climbing stairs?: No Does the patient have difficulty dressing or bathing?: No Does the patient have difficulty doing errands alone such as visiting a doctor's office or shopping?: No    Assessment & Plan  1. Stage 3a chronic kidney disease (HCC)  - CBC with Differential/Platelet - COMPLETE METABOLIC PANEL WITH GFR  2. Nonsustained ventricular tachycardia ([place])  Doing well   3. Primary insomnia  - QUEtiapine (SEROQUEL) 25 MG tablet; Take 1 tablet (25 mg total) by mouth at bedtime.  Dispense: 90 tablet; Refill: 1  4. Gastroesophageal reflux disease without esophagitis   5. Pure hypercholesterolemia  - Lipid panel  6. Needs flu shot  - Flu Vaccine QUAD High Dose(Fluad)  7. Age-related osteoporosis without current pathological fracture   8. Chronic constipation   9. Intermittent low back pain

## 2022-01-04 ENCOUNTER — Encounter: Payer: Self-pay | Admitting: Family Medicine

## 2022-01-04 ENCOUNTER — Ambulatory Visit (INDEPENDENT_AMBULATORY_CARE_PROVIDER_SITE_OTHER): Payer: PPO | Admitting: Family Medicine

## 2022-01-04 VITALS — BP 118/72 | HR 76 | Resp 16 | Ht 67.0 in | Wt 183.0 lb

## 2022-01-04 DIAGNOSIS — E7800 Pure hypercholesterolemia, unspecified: Secondary | ICD-10-CM

## 2022-01-04 DIAGNOSIS — F5101 Primary insomnia: Secondary | ICD-10-CM

## 2022-01-04 DIAGNOSIS — Z23 Encounter for immunization: Secondary | ICD-10-CM | POA: Diagnosis not present

## 2022-01-04 DIAGNOSIS — K219 Gastro-esophageal reflux disease without esophagitis: Secondary | ICD-10-CM | POA: Diagnosis not present

## 2022-01-04 DIAGNOSIS — I4729 Other ventricular tachycardia: Secondary | ICD-10-CM

## 2022-01-04 DIAGNOSIS — M545 Low back pain, unspecified: Secondary | ICD-10-CM

## 2022-01-04 DIAGNOSIS — N1831 Chronic kidney disease, stage 3a: Secondary | ICD-10-CM

## 2022-01-04 DIAGNOSIS — K5909 Other constipation: Secondary | ICD-10-CM

## 2022-01-04 DIAGNOSIS — M810 Age-related osteoporosis without current pathological fracture: Secondary | ICD-10-CM

## 2022-01-04 MED ORDER — QUETIAPINE FUMARATE 25 MG PO TABS: 25.0000 mg | ORAL_TABLET | Freq: Every day | ORAL | 1 refills | Status: DC

## 2022-01-04 NOTE — Patient Instructions (Signed)
OOFOS sandals to wear at home

## 2022-01-05 LAB — CBC WITH DIFFERENTIAL/PLATELET
Absolute Monocytes: 545 cells/uL (ref 200–950)
Basophils Absolute: 33 cells/uL (ref 0–200)
Basophils Relative: 0.6 %
Eosinophils Absolute: 182 cells/uL (ref 15–500)
Eosinophils Relative: 3.3 %
HCT: 39.6 % (ref 35.0–45.0)
Hemoglobin: 13.3 g/dL (ref 11.7–15.5)
Lymphs Abs: 1502 cells/uL (ref 850–3900)
MCH: 31.7 pg (ref 27.0–33.0)
MCHC: 33.6 g/dL (ref 32.0–36.0)
MCV: 94.5 fL (ref 80.0–100.0)
MPV: 9.3 fL (ref 7.5–12.5)
Monocytes Relative: 9.9 %
Neutro Abs: 3240 cells/uL (ref 1500–7800)
Neutrophils Relative %: 58.9 %
Platelets: 182 10*3/uL (ref 140–400)
RBC: 4.19 10*6/uL (ref 3.80–5.10)
RDW: 12.6 % (ref 11.0–15.0)
Total Lymphocyte: 27.3 %
WBC: 5.5 10*3/uL (ref 3.8–10.8)

## 2022-01-05 LAB — COMPLETE METABOLIC PANEL WITH GFR
AG Ratio: 2 (calc) (ref 1.0–2.5)
ALT: 8 U/L (ref 6–29)
AST: 15 U/L (ref 10–35)
Albumin: 4.3 g/dL (ref 3.6–5.1)
Alkaline phosphatase (APISO): 51 U/L (ref 37–153)
BUN: 18 mg/dL (ref 7–25)
CO2: 27 mmol/L (ref 20–32)
Calcium: 9.3 mg/dL (ref 8.6–10.4)
Chloride: 104 mmol/L (ref 98–110)
Creat: 0.99 mg/dL (ref 0.60–1.00)
Globulin: 2.1 g/dL (calc) (ref 1.9–3.7)
Glucose, Bld: 97 mg/dL (ref 65–139)
Potassium: 5.3 mmol/L (ref 3.5–5.3)
Sodium: 140 mmol/L (ref 135–146)
Total Bilirubin: 0.5 mg/dL (ref 0.2–1.2)
Total Protein: 6.4 g/dL (ref 6.1–8.1)
eGFR: 58 mL/min/{1.73_m2} — ABNORMAL LOW (ref >=60)

## 2022-01-05 LAB — LIPID PANEL
Cholesterol: 165 mg/dL (ref <200)
HDL: 66 mg/dL (ref >=50)
LDL Cholesterol (Calc): 81 mg/dL (calc)
Non-HDL Cholesterol (Calc): 99 mg/dL (calc) (ref <130)
Total CHOL/HDL Ratio: 2.5 (calc) (ref <5.0)
Triglycerides: 95 mg/dL (ref <150)

## 2022-01-10 DIAGNOSIS — D2262 Melanocytic nevi of left upper limb, including shoulder: Secondary | ICD-10-CM | POA: Diagnosis not present

## 2022-01-10 DIAGNOSIS — D2272 Melanocytic nevi of left lower limb, including hip: Secondary | ICD-10-CM | POA: Diagnosis not present

## 2022-01-10 DIAGNOSIS — L821 Other seborrheic keratosis: Secondary | ICD-10-CM | POA: Diagnosis not present

## 2022-01-10 DIAGNOSIS — D225 Melanocytic nevi of trunk: Secondary | ICD-10-CM | POA: Diagnosis not present

## 2022-01-10 DIAGNOSIS — D2271 Melanocytic nevi of right lower limb, including hip: Secondary | ICD-10-CM | POA: Diagnosis not present

## 2022-01-10 DIAGNOSIS — D2261 Melanocytic nevi of right upper limb, including shoulder: Secondary | ICD-10-CM | POA: Diagnosis not present

## 2022-01-13 ENCOUNTER — Ambulatory Visit: Payer: PPO

## 2022-01-14 ENCOUNTER — Ambulatory Visit: Payer: PPO | Admitting: Podiatry

## 2022-01-14 DIAGNOSIS — M722 Plantar fascial fibromatosis: Secondary | ICD-10-CM

## 2022-01-14 NOTE — Progress Notes (Unsigned)
     Chief Complaint  Patient presents with   Follow-up    Patient is here for follow-up for plantar fasciitis, patient states that the injections helped, but she is still in pain.     Subjective: 76 y.o. female presenting today for follow-up evaluation of plantar fasciitis to the bilateral feet.  Patient states that she continues to have some pain and tenderness associated to her plantar fascia.  She continues to take the meloxicam.  Past Medical History:  Diagnosis Date   Allergy    Ankle fracture 2014   Left   Anxiety    Female cystocele    Grade 2   Fracture    lower back after sneezing   GERD (gastroesophageal reflux disease)    History of   History of vertigo    last attack about 1 week ago   Hyperlipidemia    Insomnia    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   Right, .4 x 1.3 cm, noted on [place] ABD 05/2016, left lobe of the liver with the largest measuring 9.9 mm in diameter [place] ABD 06/2010   Low back sprain    Nonsustained ventricular tachycardia ([place])    by Holter,  noraml Stress test ECHO perpatient [name], [place])   Obese    Osteoporosis    Sinusitis    Urinary frequency    Wrist fracture    Left     Objective: Physical Exam General: The patient is alert and oriented x3 in no acute distress.  Dermatology: Skin is warm, dry and supple bilateral lower extremities. Negative for open lesions or macerations bilateral.   Vascular: Dorsalis Pedis and Posterior Tibial pulses palpable bilateral.  Capillary fill time is immediate to all digits.  Neurological: Epicritic and protective threshold intact bilateral.   Musculoskeletal: There continues to be tenderness to palpation to the plantar aspect of the right heel along the plantar fascia.  There is some pain and tenderness associated to the right Achilles tendon as it inserts on the posterior tubercle of the calcaneus.  All other joints range of motion within normal limits bilateral. Strength 5/5 in all groups bilateral.     Assessment: 1. Plantar fasciitis right 2.  Mild Achilles tendinitis right  Plan of Care:  1. Patient evaluated.   2.  Patient declined injections today 3.  Continue meloxicam 15 mg daily 4.  Cam boot dispensed.  Weightbearing as tolerated x4 weeks 5.  Night splint also dispensed.  Wear nightly 6.  Also stressed the importance of occasionally getting out of the cam boot a few times throughout the day and doing stretching exercises. 7.  Return to clinic 4 weeks  *Works at [employer] @ [place] M. Allan [place], [place] [facility]  Dr. Edrick Kins, DPM    [address], [place] [address]                Office [phone]  Fax [phone]

## 2022-02-04 ENCOUNTER — Encounter: Payer: PPO | Admitting: Podiatry

## 2022-02-08 ENCOUNTER — Ambulatory Visit: Payer: PPO | Admitting: Podiatry

## 2022-02-08 DIAGNOSIS — M722 Plantar fascial fibromatosis: Secondary | ICD-10-CM

## 2022-02-08 NOTE — Progress Notes (Signed)
Chief Complaint  Patient presents with   Plantar Fasciitis    Patient is here for right foot plantar fasciitis, she states that it still hurts.    Subjective: 76 y.o. female presenting today for follow-up evaluation of plantar fasciitis to the bilateral feet.  Unfortunately the patient continues to have pain and tenderness associated especially to the right heel and Achilles tendon.  She has tried multiple conservative modalities including wearing the cam boot for the past month with no improvement.  She also sees a Restaurant manager, fast food who administers e-stim.  She has had multiple cortisone injections as well as oral anti-inflammatories and sugar modifications with no relief.  Past Medical History:  Diagnosis Date   Allergy    Ankle fracture 2014   Left   Anxiety    Female cystocele    Grade 2   Fracture    lower back after sneezing   GERD (gastroesophageal reflux disease)    History of   History of vertigo    last attack about 1 week ago   Hyperlipidemia    Insomnia    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   Right, .4 x 1.3 cm, noted on [place] ABD 05/2016, left lobe of the liver with the largest measuring 9.9 mm in diameter [place] ABD 06/2010   Low back sprain    Nonsustained ventricular tachycardia ([place])    by Holter,  noraml Stress test ECHO perpatient [name], [place])   Obese    Osteoporosis    Sinusitis    Urinary frequency    Wrist fracture    Left   Past Surgical History:  Procedure Laterality Date   ABDOMINAL HYSTERECTOMY     BREAST BIOPSY Right    negative 10-12 years ago- core   CESAREAN SECTION     COLONOSCOPY  2012   CYSTOCELE REPAIR N/A 06/19/2018   Procedure: CYSTOSCOPY ANTERIOR REPAIR (CYSTOCELE);  Surgeon: Bjorn Loser, MD;  Location: [facility];  Service: Urology;  Laterality: N/A;   TONSILLECTOMY     Allergies  Allergen Reactions   Betadine [Povidone Iodine] Other (See Comments)    Burns   Iodine      Objective: Physical Exam General: The patient is alert and oriented x3  in no acute distress.  Dermatology: Skin is warm, dry and supple bilateral lower extremities. Negative for open lesions or macerations bilateral.   Vascular: Dorsalis Pedis and Posterior Tibial pulses palpable bilateral.  Capillary fill time is immediate to all digits.  Neurological: Epicritic and protective threshold intact bilateral.   Musculoskeletal: There continues to be tenderness to palpation to the plantar aspect of the right heel along the plantar fascia.  There is some pain and tenderness associated to the right Achilles tendon as it inserts on the posterior tubercle of the calcaneus.  All other joints range of motion within normal limits bilateral. Strength 5/5 in all groups bilateral.    Assessment: 1. Plantar fasciitis right 2.  Achilles tendinitis right  Plan of Care:  1. Patient evaluated.   2.  MRI ordered RT heel.  Unfortunately she has tried multiple conservative modalities with no improvement of her pain and she says that the pain is only increasing. 3.  Continue meloxicam 15 mg daily 4.  Continue night splint.   5.  Return to clinic after MRI to review results and discuss further treatment options  *Works at [employer] @ [employer]  Edrick Kins, DPM [facility]  Dr. Edrick Kins, DPM  [address], [place] [address]                Office [phone]  Fax [phone]

## 2022-02-22 ENCOUNTER — Telehealth: Payer: Self-pay | Admitting: *Deleted

## 2022-02-22 NOTE — Telephone Encounter (Signed)
Patient is calling for status of her MRI scheduling, please advise. Called [facility] for status, stated that they have tried to reach several times, left messages.  Patient is aware and will contact them to schedule.

## 2022-02-28 ENCOUNTER — Ambulatory Visit
Admission: RE | Admit: 2022-02-28 | Discharge: 2022-02-28 | Disposition: A | Discharge status: Home / Self Care | Payer: PPO | Source: Ambulatory Visit | Attending: Podiatry | Admitting: Podiatry

## 2022-02-28 DIAGNOSIS — M722 Plantar fascial fibromatosis: Secondary | ICD-10-CM

## 2022-02-28 DIAGNOSIS — R600 Localized edema: Secondary | ICD-10-CM | POA: Diagnosis not present

## 2022-02-28 DIAGNOSIS — M7731 Calcaneal spur, right foot: Secondary | ICD-10-CM | POA: Diagnosis not present

## 2022-03-21 DIAGNOSIS — M810 Age-related osteoporosis without current pathological fracture: Secondary | ICD-10-CM | POA: Diagnosis not present

## 2022-03-31 DIAGNOSIS — M810 Age-related osteoporosis without current pathological fracture: Secondary | ICD-10-CM | POA: Diagnosis not present

## 2022-04-06 ENCOUNTER — Ambulatory Visit: Payer: PPO | Admitting: Podiatry

## 2022-04-06 DIAGNOSIS — M722 Plantar fascial fibromatosis: Secondary | ICD-10-CM | POA: Diagnosis not present

## 2022-04-06 NOTE — Progress Notes (Signed)
  Subjective:  Patient ID: ECE CUMBERLAND, female    DOB: [birth year],  MRN: [id number]  Persistent pain in heels  77 y.o. female presents with the above complaint. History confirmed with patient.  She completed the MRI still very painful on the right side.  Objective:  Physical Exam: warm, good capillary refill, no trophic changes or ulcerative lesions, normal DP and PT pulses, and normal sensory exam.  Right Foot: point tenderness over the heel pad   IMPRESSION: 1. Mild plantar calcaneal heel spur. Small partial-thickness tear at the deep attachment of the slightly medial aspect of the plantar fascia. Moderate surrounding soft tissue edema and mild adjacent calcaneal marrow edema. No retraction of the plantar fascia. 2. Mild attenuation of the anterior talofibular ligament may represent the patient's normal anatomy versus a remote partial-thickness tear.     Electronically Signed   By: Yvonne Kendall M.D.   On: 03/01/2022 15:15 Assessment:   1. Plantar fasciitis, right      Plan:  Patient was evaluated and treated and all questions answered.    -Repeat injection given to right heel until she can see Dr. Amalia Hailey for surgical consult -I discussed with her the MRI shows a partial tear.  She has completed some physical therapy with dry needling as well as a cam walker boot for 1 month which did not help.  She may be looking at surgical correction of this at this point.  She will follow-up with Dr. Amalia Hailey for surgical consult.  After sterile prep with povidone-iodine solution and alcohol, the bilateral heel was injected with 0.5cc 2% xylocaine plain, 0.5cc 0.5% marcaine plain, '5mg'$  triamcinolone acetonide, and '2mg'$  dexamethasone was injected along the medial plantar fascia at the insertion on the plantar calcaneus. The patient tolerated the procedure well without complication.    No follow-ups on file.

## 2022-04-07 DIAGNOSIS — M810 Age-related osteoporosis without current pathological fracture: Secondary | ICD-10-CM | POA: Diagnosis not present

## 2022-04-08 ENCOUNTER — Ambulatory Visit: Payer: PPO | Admitting: Podiatry

## 2022-04-08 DIAGNOSIS — M722 Plantar fascial fibromatosis: Secondary | ICD-10-CM

## 2022-04-08 NOTE — Progress Notes (Signed)
Chief Complaint  Patient presents with   Foot Problem    MRI results and consult for surgery     Subjective: 77 y.o. female presenting today for follow-up evaluation of plantar fasciitis to the bilateral feet.  Unfortunately the patient continues to have pain and tenderness associated especially to the right heel and Achilles tendon.  She has tried multiple conservative modalities including wearing the cam boot for the past month with no improvement.  She also sees a chiropractor who administers e-stim and dry needling.  She has had multiple cortisone injections as well as oral anti-inflammatories and sugar modifications with no relief.  Last visit MRI was ordered.  She presents to review the MRI results and for surgical consult  Past Medical History:  Diagnosis Date   Allergy    Ankle fracture 2014   Left   Anxiety    Female cystocele    Grade 2   Fracture    lower back after sneezing   GERD (gastroesophageal reflux disease)    History of   History of vertigo    last attack about 1 week ago   Hyperlipidemia    Insomnia    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   Right, .4 x 1.3 cm, noted on [place] ABD 05/2016, left lobe of the liver with the largest measuring 9.9 mm in diameter [place] ABD 06/2010   Low back sprain    Nonsustained ventricular tachycardia ([place])    by Holter,  noraml Stress test ECHO perpatient [name], [place])   Obese    Osteoporosis    Sinusitis    Urinary frequency    Wrist fracture    Left   Past Surgical History:  Procedure Laterality Date   ABDOMINAL HYSTERECTOMY     BREAST BIOPSY Right    negative 10-12 years ago- core   CESAREAN SECTION     COLONOSCOPY  2012   CYSTOCELE REPAIR N/A 06/19/2018   Procedure: CYSTOSCOPY ANTERIOR REPAIR (CYSTOCELE);  Surgeon: Bjorn Loser, MD;  Location: [facility];  Service: Urology;  Laterality: N/A;   TONSILLECTOMY     Allergies  Allergen Reactions   Betadine [Povidone Iodine] Other (See Comments)    Burns   Iodine       Objective: Physical Exam General: The patient is alert and oriented x3 in no acute distress.  Dermatology: Skin is warm, dry and supple bilateral lower extremities. Negative for open lesions or macerations bilateral.   Vascular: Dorsalis Pedis and Posterior Tibial pulses palpable bilateral.  Capillary fill time is immediate to all digits.  Neurological: Epicritic and protective threshold intact bilateral.   Musculoskeletal: There continues to be tenderness to palpation to the plantar aspect of the right heel along the plantar fascia.  Also chronic pain and tenderness associated to the right Achilles tendon as it inserts on the posterior tubercle of the calcaneus.  All other joints range of motion within normal limits bilateral. Strength 5/5 in all groups bilateral.   MR HEEL RIGHT WO CONTRAST 02/28/2022 IMPRESSION: 1. Mild plantar calcaneal heel spur. Small partial-thickness tear at the deep attachment of the slightly medial aspect of the plantar fascia. Moderate surrounding soft tissue edema and mild adjacent calcaneal marrow edema. No retraction of the plantar fascia. 2. Mild attenuation of the anterior talofibular ligament may represent the patient's normal anatomy versus a remote partial-thickness tear.  Assessment: 1. Plantar fasciitis right 2.  Achilles tendinitis right -Patient evaluated -After discussing with the patient the MRI findings and the chronic pain despite  conservative treatments I do believe surgery is warranted at this time.  Today we discussed endoscopic plantar fasciotomy with gastroc aponeurosis lengthening.  Risk benefits advantages disadvantages as well as the postoperative recovery course were explained in detail.  No guarantees were expressed or implied.  All patient questions answered.  Patient would like to proceed with surgery -Authorization for surgery initiated today.  Surgery will consist of endoscopic plantar fasciotomy right.  Gastroc aponeurosis  lengthening right -Return to clinic 1 week postop  *Works at [employer] @ [place] M. Darneisha Windhorst, DPM [facility]  Dr. Edrick Kins, DPM    [address], [place] [address]                Office [phone]  Fax [phone]

## 2022-04-19 ENCOUNTER — Telehealth: Payer: Self-pay | Admitting: Podiatry

## 2022-04-19 NOTE — Telephone Encounter (Signed)
DOS: 05/12/2022  [employer]  Endoscopic Plantar Fasciotomy Rt (04045) Gastrocnemius Recess Rt [phone])  Authorization #: [id number] Authorization Valid: 05/12/2022 - 08/10/2022

## 2022-04-25 ENCOUNTER — Other Ambulatory Visit: Payer: Self-pay | Admitting: Podiatry

## 2022-04-25 DIAGNOSIS — M722 Plantar fascial fibromatosis: Secondary | ICD-10-CM

## 2022-05-12 ENCOUNTER — Other Ambulatory Visit: Payer: Self-pay | Admitting: Podiatry

## 2022-05-12 DIAGNOSIS — M722 Plantar fascial fibromatosis: Secondary | ICD-10-CM | POA: Diagnosis not present

## 2022-05-12 DIAGNOSIS — M216X1 Other acquired deformities of right foot: Secondary | ICD-10-CM | POA: Diagnosis not present

## 2022-05-12 DIAGNOSIS — G8918 Other acute postprocedural pain: Secondary | ICD-10-CM | POA: Diagnosis not present

## 2022-05-12 DIAGNOSIS — M205X1 Other deformities of toe(s) (acquired), right foot: Secondary | ICD-10-CM | POA: Diagnosis not present

## 2022-05-12 MED ORDER — OXYCODONE-ACETAMINOPHEN 5-325 MG PO TABS: 1.0000 | ORAL_TABLET | ORAL | 0 refills | Status: DC | PRN

## 2022-05-12 MED ORDER — IBUPROFEN 800 MG PO TABS: 800.0000 mg | ORAL_TABLET | Freq: Three times a day (TID) | ORAL | 1 refills | Status: DC | PRN

## 2022-05-12 NOTE — Progress Notes (Signed)
PRN postop 

## 2022-05-17 ENCOUNTER — Ambulatory Visit (INDEPENDENT_AMBULATORY_CARE_PROVIDER_SITE_OTHER): Payer: PPO | Admitting: Podiatry

## 2022-05-17 DIAGNOSIS — Z9889 Other specified postprocedural states: Secondary | ICD-10-CM

## 2022-05-17 NOTE — Progress Notes (Signed)
   Chief Complaint  Patient presents with   Routine Post Op    POV #1 DOS 05/12/2022 EPF RT, GASTROC APONCUROSIS LENGTHENING RT)    Subjective:  Patient presents today status post gastroc aponeurosis lengthening with endoscopic plantar fasciotomy of the right lower extremity.  DOS: 05/12/2022.  Patient states that she is doing very well.  She is minimal WBAT in the cam boot with the assistance of a walker.  Dressings are intact.  No new complaints.  Presenting today with her daughter  Past Medical History:  Diagnosis Date   Allergy    Ankle fracture 2014   Left   Anxiety    Female cystocele    Grade 2   Fracture    lower back after sneezing   GERD (gastroesophageal reflux disease)    History of   History of vertigo    last attack about 1 week ago   Hyperlipidemia    Insomnia    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   Right, .4 x 1.3 cm, noted on [place] ABD 05/2016, left lobe of the liver with the largest measuring 9.9 mm in diameter [place] ABD 06/2010   Low back sprain    Nonsustained ventricular tachycardia ([place])    by Holter,  noraml Stress test ECHO perpatient [name], [place])   Obese    Osteoporosis    Sinusitis    Urinary frequency    Wrist fracture    Left    Past Surgical History:  Procedure Laterality Date   ABDOMINAL HYSTERECTOMY     BREAST BIOPSY Right    negative 10-12 years ago- core   CESAREAN SECTION     COLONOSCOPY  2012   CYSTOCELE REPAIR N/A 06/19/2018   Procedure: CYSTOSCOPY ANTERIOR REPAIR (CYSTOCELE);  Surgeon: Bjorn Loser, MD;  Location: [facility];  Service: Urology;  Laterality: N/A;   TONSILLECTOMY      Allergies  Allergen Reactions   Betadine [Povidone Iodine] Other (See Comments)    Burns   Iodine     Objective/Physical Exam Neurovascular status intact.  Incision well coapted with sutures and staples intact. No sign of infectious process noted. No dehiscence. No active bleeding noted.  Minimal edema noted to the surgical extremity.  Overall well-healing surgical  foot  Assessment: 1. s/p endoscopic plantar fasciotomy with gastroc aponeurosis lengthening right. DOS: 05/12/2022 -Patient evaluated -Dressings changed.  Patient may begin washing and showering and getting the foot wet -Recommend triple antibiotic and a light dressing to the incision sites -Continue Ace wrap daily.  Ace wrap provided -Continue minimal WBAT cam boot with the assistance of a walker -RTC 1 week   Edrick Kins, DPM [facility]  Dr. Edrick Kins, DPM    [address], [place] [address]                Office [phone]  Fax [phone]

## 2022-05-24 ENCOUNTER — Encounter: Payer: Self-pay | Admitting: Podiatry

## 2022-05-24 ENCOUNTER — Ambulatory Visit (INDEPENDENT_AMBULATORY_CARE_PROVIDER_SITE_OTHER): Payer: PPO | Admitting: Podiatry

## 2022-05-24 VITALS — BP 133/76 | HR 70

## 2022-05-24 DIAGNOSIS — Z9889 Other specified postprocedural states: Secondary | ICD-10-CM

## 2022-05-24 NOTE — Progress Notes (Signed)
   Chief Complaint  Patient presents with   Routine Post Op    "It's going good."    Subjective:  Patient presents today status post gastroc aponeurosis lengthening with endoscopic plantar fasciotomy of the right lower extremity.  DOS: 05/12/2022.  Patient doing very well.  Minimal pain.  She is WBAT in the cam boot with the assistance of a walker.  No new complaints at this time  Past Medical History:  Diagnosis Date   Allergy    Ankle fracture 2014   Left   Anxiety    Female cystocele    Grade 2   Fracture    lower back after sneezing   GERD (gastroesophageal reflux disease)    History of   History of vertigo    last attack about 1 week ago   Hyperlipidemia    Insomnia    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   Right, .4 x 1.3 cm, noted on [place] ABD 05/2016, left lobe of the liver with the largest measuring 9.9 mm in diameter [place] ABD 06/2010   Low back sprain    Nonsustained ventricular tachycardia ([place])    by Holter,  noraml Stress test ECHO perpatient [name], [place])   Obese    Osteoporosis    Sinusitis    Urinary frequency    Wrist fracture    Left    Past Surgical History:  Procedure Laterality Date   ABDOMINAL HYSTERECTOMY     BREAST BIOPSY Right    negative 10-12 years ago- core   CESAREAN SECTION     COLONOSCOPY  2012   CYSTOCELE REPAIR N/A 06/19/2018   Procedure: CYSTOSCOPY ANTERIOR REPAIR (CYSTOCELE);  Surgeon: Bjorn Loser, MD;  Location: [facility];  Service: Urology;  Laterality: N/A;   TONSILLECTOMY      Allergies  Allergen Reactions   Betadine [Povidone Iodine] Other (See Comments)    Burns   Iodine     Objective/Physical Exam Neurovascular status intact.  Incision well coapted with sutures and staples intact. No sign of infectious process noted. No dehiscence. No active bleeding noted.  Minimal edema noted to the surgical extremity.  Overall well-healing surgical foot  Assessment: 1. s/p endoscopic plantar fasciotomy with gastroc aponeurosis lengthening right. DOS:  05/12/2022 -Patient evaluated - Sutures removed from the endoscopic plantar fasciotomy site -Staples left intact.  We will remove these next visit -Continue Ace wrap around the upper leg.  Compression ankle sleeve applied to the foot and ankle -Continue WBAT cam boot -Return to clinic 2 weeks for staple removal and to initiate physical therapy at [facility]   Edrick Kins, DPM [facility]  Dr. Edrick Kins, DPM    [address], [place] [address]                Office [phone]  Fax [phone]

## 2022-06-03 ENCOUNTER — Telehealth: Payer: Self-pay | Admitting: Podiatry

## 2022-06-03 NOTE — Telephone Encounter (Signed)
Pt left message yesterday 2.29 @ 553 stating she was returning our call.  Upon checking I think it may have been a reminder call.   Called pt and voicemail is full.

## 2022-06-07 ENCOUNTER — Encounter: Payer: Self-pay | Admitting: Podiatry

## 2022-06-07 ENCOUNTER — Ambulatory Visit (INDEPENDENT_AMBULATORY_CARE_PROVIDER_SITE_OTHER): Payer: PPO | Admitting: Podiatry

## 2022-06-07 VITALS — BP 116/70 | HR 66

## 2022-06-07 DIAGNOSIS — Z9889 Other specified postprocedural states: Secondary | ICD-10-CM

## 2022-06-07 NOTE — Progress Notes (Signed)
   Chief Complaint  Patient presents with   Routine Post Op    "It's doing pretty good."    Subjective:  Patient presents today status post gastroc aponeurosis lengthening with endoscopic plantar fasciotomy of the right lower extremity.  DOS: 05/12/2022.  Patient continues to do very well.  She is very satisfied so far with the amount of pain in her recovery.  She is currently WBAT cam boot  Past Medical History:  Diagnosis Date   Allergy    Ankle fracture 2014   Left   Anxiety    Female cystocele    Grade 2   Fracture    lower back after sneezing   GERD (gastroesophageal reflux disease)    History of   History of vertigo    last attack about 1 week ago   Hyperlipidemia    Insomnia    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   Right, .4 x 1.3 cm, noted on [place] ABD 05/2016, left lobe of the liver with the largest measuring 9.9 mm in diameter [place] ABD 06/2010   Low back sprain    Nonsustained ventricular tachycardia ([place])    by Holter,  noraml Stress test ECHO perpatient [name], [place])   Obese    Osteoporosis    Sinusitis    Urinary frequency    Wrist fracture    Left    Past Surgical History:  Procedure Laterality Date   ABDOMINAL HYSTERECTOMY     BREAST BIOPSY Right    negative 10-12 years ago- core   CESAREAN SECTION     COLONOSCOPY  2012   CYSTOCELE REPAIR N/A 06/19/2018   Procedure: CYSTOSCOPY ANTERIOR REPAIR (CYSTOCELE);  Surgeon: Bjorn Loser, MD;  [facility]: [facility];  Service: Urology;  Laterality: N/A;   TONSILLECTOMY      Allergies  Allergen Reactions   Betadine [Povidone Iodine] Other (See Comments)    Burns   Iodine     Objective/Physical Exam Neurovascular status intact.  Incision along the plantar heel healed.  Staples intact with the incision well coapted along the medial aspect of the calf. No sign of infectious process noted. No dehiscence. No active bleeding noted.  No edema noted to the surgical extremity.  Overall well-healing surgical foot  Assessment: 1. s/p  endoscopic plantar fasciotomy with gastroc aponeurosis lengthening right. DOS: 05/12/2022 -Patient evaluated - Staples removed -Patient may now slowly begin to transition out of the cam boot into good supportive tennis shoes and sneakers over the next 3-4 weeks -Return to clinic 4 weeks  Edrick Kins, DPM [facility]  Dr. Edrick Kins, DPM    [address], [place] [address]                Office [phone]  Fax [phone]

## 2022-06-22 DIAGNOSIS — M810 Age-related osteoporosis without current pathological fracture: Secondary | ICD-10-CM | POA: Diagnosis not present

## 2022-06-28 ENCOUNTER — Other Ambulatory Visit: Payer: Self-pay | Admitting: Family Medicine

## 2022-06-28 DIAGNOSIS — E7800 Pure hypercholesterolemia, unspecified: Secondary | ICD-10-CM

## 2022-07-01 NOTE — Progress Notes (Unsigned)
Name: Jacqueline Kaiser   MRN: [id number]    DOB: Feb 01, 1946   Date:07/04/2022       Progress Note  Subjective  Chief Complaint  Follow Up  HPI  Chest pain substernal: she noticed a few episodes in the past two weeks. Described as pressure on left side of chest , radiates to her back, it can happen at rest or during activity, no nausea or vomiting, no diaphoresis but has some lightheadedness, she feels tired during the episode but not afterwards. Discussed importance of timing episodes and calling 911 if longer than 10 minutes or more intense. We will refer her to cardiologist.   Osteoporosis history of vertebral compression fracture:  She is still getting Prolia with Dr. Gabriel Carina and denies side effects of medication.  Last infusion was 06/2022  goes twice a year, continue vitamin D supplementation    Chronic lumbar spine pain: she states it has been going on for years, however fall of 2018 pain was severe and did not improve with chiropractor care, she was sent to [place] and found to have radiculitis but she is doing well now  She states pain now is intermittent , no problems when walking or standing anymore She goes to chiropractor , Dr. Freddi Che every 2 weeks for adjustment She stopped taking muscle relaxer    GERD: she states symptoms are controlled taking it prn only when she knows she will splurge. She takes omeprazole prn   CKI stage IIIa  : she used to have stage III but labs have been normal for a while., she is off nsaid's now , GFR stable at 58    NSVT: she states diagnosed years ago by a cardiologist, no chest pain or palpation, she denies any symptoms. Denies decrease in exercise tolerance. She denies any palpitation but recent episodes of chst pain and will refer her to Dr. Caryl Comes    Hyperlipidemia: taking Crestor, denies myalgia, chest pain or palpitation. Last LDL was down from 123 to 81    Insomnia: she states she falls asleep without problems, but she was waking up  in the  middle of the night but since started on Seroquel she is able to go back to bed., she likes the medication and needs a refill  No side effects   Vertigo: history of vertigo but no recent episodes, having some tinnitus  for the past couple of weeks, discussed referral to ENT  Plantar Fascitis: she had foot surgery Feb 2024 and doing better now   Patient Active Problem List   Diagnosis Date Noted   Coccyalgia 01/11/2021   Cystocele with prolapse 06/19/2018   Age-related osteoporosis without current pathological fracture 01/08/2018   History of vertebral compression fracture 05/23/2017   Incomplete bladder emptying 05/16/2016   GERD (gastroesophageal reflux disease) 02/14/2016   Insomnia due to anxiety and fear 02/14/2016   Allergy 07/01/2013   Osteoporosis of lumbar spine 05/19/2013   Overweight (BMI 25.0-29.9) 08/28/2011   Peripheral vertigo 08/28/2011   Nonsustained ventricular tachycardia    Hyperlipidemia     Past Surgical History:  Procedure Laterality Date   ABDOMINAL HYSTERECTOMY     BREAST BIOPSY Right    negative 10-12 years ago- core   CESAREAN SECTION     COLONOSCOPY  2012   CYSTOCELE REPAIR N/A 06/19/2018   Procedure: CYSTOSCOPY ANTERIOR REPAIR (CYSTOCELE);  Surgeon: Bjorn Loser, MD;  Location: [facility];  Service: Urology;  Laterality: N/A;   TONSILLECTOMY      Family History  Problem Relation Age of Onset   Heart disease Mother        ATRIAL FIB   Cancer Father 97       Lung Ca,  nonsmoker, metastatic at diagnosis  asbestos exposure   Breast cancer Daughter 32       dx at age of 31 and 52    Social History   Tobacco Use   Smoking status: Former    Packs/day: 0.50    Years: 10.00    Additional pack years: 0.00    Total pack years: 5.00    Types: Cigarettes    Quit date: 04/04/1978    Years since quitting: 44.2   Smokeless tobacco: Never   Tobacco comments:    smoking cessation materials not required  Substance Use Topics   Alcohol use: Not  Currently    Comment: wine occasionally     Current Outpatient Medications:    Ascorbic Acid (VITAMIN C) 100 MG tablet, Take 100 mg by mouth daily., Disp: , Rfl:    calcium carbonate (OS-CAL) 600 MG TABS tablet, Take by mouth., Disp: , Rfl:    cholecalciferol (VITAMIN D3) 25 MCG (1000 UT) tablet, Take 1,000 Units by mouth daily., Disp: , Rfl:    denosumab (PROLIA) 60 MG/ML SOSY injection, Inject 1 mL into the skin every 6 (six) months., Disp: , Rfl:    ibuprofen (ADVIL) 800 MG tablet, Take 1 tablet (800 mg total) by mouth every 8 (eight) hours as needed., Disp: 60 tablet, Rfl: 1   meclizine (ANTIVERT) 12.5 MG tablet, Take 1 tablet (12.5 mg total) by mouth 4 (four) times daily as needed for dizziness., Disp: 30 tablet, Rfl: 0   omeprazole (PRILOSEC) 20 MG capsule, Take 20 mg by mouth daily., Disp: , Rfl:    polyethylene glycol powder (GLYCOLAX/MIRALAX) 17 GM/SCOOP powder, Take 17 g by mouth once. PRN, Disp: , Rfl:    QUEtiapine (SEROQUEL) 25 MG tablet, Take 1 tablet (25 mg total) by mouth at bedtime., Disp: 90 tablet, Rfl: 1   rosuvastatin (CRESTOR) 20 MG tablet, TAKE 1 TABLET BY MOUTH EVERY EVENING, Disp: 90 tablet, Rfl: 1   vitamin B-12 (CYANOCOBALAMIN) 500 MCG tablet, Take 500 mcg by mouth daily., Disp: , Rfl:    meloxicam (MOBIC) 15 MG tablet, Take 1 tablet (15 mg total) by mouth daily. (Patient not taking: Reported on 07/04/2022), Disp: 30 tablet, Rfl: 1   oxyCODONE-acetaminophen (PERCOCET) 5-325 MG tablet, Take 1 tablet by mouth every 4 (four) hours as needed for severe pain. (Patient not taking: Reported on 07/04/2022), Disp: 30 tablet, Rfl: 0   tiZANidine (ZANAFLEX) 4 MG tablet, Take 1 tablet (4 mg total) by mouth every 6 (six) hours as needed for muscle spasms. (Patient not taking: Reported on 07/04/2022), Disp: 30 tablet, Rfl: 0  Current Facility-Administered Medications:    betamethasone acetate-betamethasone sodium phosphate (CELESTONE) injection 3 mg, 3 mg, Intra-articular, Once, Amalia Hailey,  Dorathy Daft, DPM  Allergies  Allergen Reactions   Betadine [Povidone Iodine] Other (See Comments)    Burns   Iodine     I personally reviewed active problem list, medication list, allergies, family history, social history, health maintenance with the patient/caregiver today.   ROS  Ten systems reviewed and is negative except as mentioned in HPI   Objective  Vitals:   07/04/22 0916  BP: 118/70  Pulse: 73  Resp: 16  SpO2: 99%  Weight: 189 lb (85.7 kg)  Height: 5\' 7"  (1.702 m)    Body mass index is  29.6 kg/m.  Physical Exam  Constitutional: Patient appears well-developed and well-nourished.  No distress.  HEENT: head atraumatic, normocephalic, pupils equal and reactive to light, neck supple Cardiovascular: Normal rate, regular rhythm and normal heart sounds.  No murmur heard. No BLE edema. Pulmonary/Chest: Effort normal and breath sounds normal. No respiratory distress. Abdominal: Soft.  There is no tenderness. Psychiatric: Patient has a normal mood and affect. behavior is normal. Judgment and thought content normal.   PHQ2/9:    07/04/2022    9:16 AM 01/04/2022    8:17 AM 12/07/2021    3:26 PM 09/08/2021   10:42 AM 07/05/2021    7:37 AM  Depression screen PHQ 2/9  Decreased Interest 0 0 0 0 0  Down, Depressed, Hopeless 0 0 0 0 0  PHQ - 2 Score 0 0 0 0 0  Altered sleeping 0 0 0  0  Tired, decreased energy 0 0 0  0  Change in appetite 0 0 0  0  Feeling bad or failure about yourself  0 0 0  0  Trouble concentrating 0 0 0  0  Moving slowly or fidgety/restless 0 0 0  0  Suicidal thoughts 0 0 0  0  PHQ-9 Score 0 0 0  0  Difficult doing work/chores   Not difficult at all      phq 9 is negative   Fall Risk:    07/04/2022    9:16 AM 01/04/2022    8:17 AM 12/07/2021    3:26 PM 09/08/2021   10:42 AM 07/05/2021    7:37 AM  Fall Risk   Falls in the past year? 0 0 0 0 0  Number falls in past yr: 0 0 0 0 0  Injury with Fall? 0 0 0 0 0  Risk for fall due to : No Fall Risks No  Fall Risks  No Fall Risks No Fall Risks  Follow up Falls prevention discussed Falls prevention discussed  Falls prevention discussed Falls prevention discussed      Functional Status Survey: Is the patient deaf or have difficulty hearing?: Yes Does the patient have difficulty seeing, even when wearing glasses/contacts?: No Does the patient have difficulty concentrating, remembering, or making decisions?: Yes Does the patient have difficulty walking or climbing stairs?: No Does the patient have difficulty dressing or bathing?: No Does the patient have difficulty doing errands alone such as visiting a doctor's office or shopping?: No    Assessment & Plan  1. Chest pain in adult  - Ambulatory referral to Cardiology - aspirin EC 81 MG tablet; Take 1 tablet (81 mg total) by mouth daily. Swallow whole.  Dispense: 100 tablet; Refill: 1  2. Primary insomnia  - QUEtiapine (SEROQUEL) 25 MG tablet; Take 1 tablet (25 mg total) by mouth at bedtime.  Dispense: 90 tablet; Refill: 1  3. Stage 3a chronic kidney disease  Avoid nsaid's   4. Nonsustained ventricular tachycardia   5. Gastroesophageal reflux disease without esophagitis   6. Breast cancer screening by mammogram  - MM 3D SCREENING MAMMOGRAM BILATERAL BREAST; Future  7. Chronic constipation   8. Age-related osteoporosis without current pathological fracture    9. Tinnitus of both ears  She will see her audiologist first and let me know if she needs to see ENT

## 2022-07-04 ENCOUNTER — Ambulatory Visit (INDEPENDENT_AMBULATORY_CARE_PROVIDER_SITE_OTHER): Payer: PPO | Admitting: Family Medicine

## 2022-07-04 ENCOUNTER — Encounter: Payer: Self-pay | Admitting: Family Medicine

## 2022-07-04 VITALS — BP 118/70 | HR 73 | Resp 16 | Ht 67.0 in | Wt 189.0 lb

## 2022-07-04 DIAGNOSIS — F5101 Primary insomnia: Secondary | ICD-10-CM

## 2022-07-04 DIAGNOSIS — I4729 Other ventricular tachycardia: Secondary | ICD-10-CM | POA: Diagnosis not present

## 2022-07-04 DIAGNOSIS — H9313 Tinnitus, bilateral: Secondary | ICD-10-CM

## 2022-07-04 DIAGNOSIS — M810 Age-related osteoporosis without current pathological fracture: Secondary | ICD-10-CM

## 2022-07-04 DIAGNOSIS — R079 Chest pain, unspecified: Secondary | ICD-10-CM | POA: Diagnosis not present

## 2022-07-04 DIAGNOSIS — N1831 Chronic kidney disease, stage 3a: Secondary | ICD-10-CM | POA: Diagnosis not present

## 2022-07-04 DIAGNOSIS — Z1231 Encounter for screening mammogram for malignant neoplasm of breast: Secondary | ICD-10-CM

## 2022-07-04 DIAGNOSIS — K5909 Other constipation: Secondary | ICD-10-CM

## 2022-07-04 DIAGNOSIS — K219 Gastro-esophageal reflux disease without esophagitis: Secondary | ICD-10-CM | POA: Diagnosis not present

## 2022-07-04 MED ORDER — ASPIRIN 81 MG PO TBEC: 81.0000 mg | DELAYED_RELEASE_TABLET | Freq: Every day | ORAL | 1 refills | Status: AC

## 2022-07-04 MED ORDER — QUETIAPINE FUMARATE 25 MG PO TABS: 25.0000 mg | ORAL_TABLET | Freq: Every day | ORAL | 1 refills | Status: DC

## 2022-07-05 ENCOUNTER — Emergency Department: Payer: PPO

## 2022-07-05 ENCOUNTER — Encounter
Admission: EM | Disposition: A | Discharge status: Home / Self Care | Payer: Self-pay | Source: Home / Self Care | Attending: Emergency Medicine

## 2022-07-05 ENCOUNTER — Ambulatory Visit: Payer: PPO | Attending: Internal Medicine | Admitting: Internal Medicine

## 2022-07-05 ENCOUNTER — Observation Stay
Admission: EM | Admit: 2022-07-05 | Discharge: 2022-07-06 | Disposition: A | Discharge status: Home / Self Care | Payer: PPO | Attending: Cardiovascular Disease | Admitting: Cardiovascular Disease

## 2022-07-05 ENCOUNTER — Encounter: Payer: PPO | Admitting: Podiatry

## 2022-07-05 ENCOUNTER — Encounter: Payer: Self-pay | Admitting: Internal Medicine

## 2022-07-05 ENCOUNTER — Other Ambulatory Visit: Payer: Self-pay

## 2022-07-05 ENCOUNTER — Encounter: Payer: Self-pay | Admitting: Cardiovascular Disease

## 2022-07-05 VITALS — BP 114/72 | HR 62 | Ht 67.0 in | Wt 185.6 lb

## 2022-07-05 DIAGNOSIS — Z7982 Long term (current) use of aspirin: Secondary | ICD-10-CM | POA: Diagnosis not present

## 2022-07-05 DIAGNOSIS — I25110 Atherosclerotic heart disease of native coronary artery with unstable angina pectoris: Principal | ICD-10-CM | POA: Insufficient documentation

## 2022-07-05 DIAGNOSIS — R079 Chest pain, unspecified: Secondary | ICD-10-CM

## 2022-07-05 DIAGNOSIS — I2510 Atherosclerotic heart disease of native coronary artery without angina pectoris: Secondary | ICD-10-CM | POA: Insufficient documentation

## 2022-07-05 DIAGNOSIS — R9431 Abnormal electrocardiogram [ECG] [EKG]: Secondary | ICD-10-CM | POA: Diagnosis not present

## 2022-07-05 DIAGNOSIS — R1011 Right upper quadrant pain: Secondary | ICD-10-CM | POA: Diagnosis not present

## 2022-07-05 DIAGNOSIS — H9319 Tinnitus, unspecified ear: Secondary | ICD-10-CM | POA: Diagnosis not present

## 2022-07-05 DIAGNOSIS — K7689 Other specified diseases of liver: Secondary | ICD-10-CM | POA: Diagnosis not present

## 2022-07-05 DIAGNOSIS — R0789 Other chest pain: Secondary | ICD-10-CM | POA: Diagnosis not present

## 2022-07-05 DIAGNOSIS — Z79899 Other long term (current) drug therapy: Secondary | ICD-10-CM | POA: Insufficient documentation

## 2022-07-05 DIAGNOSIS — I200 Unstable angina: Secondary | ICD-10-CM | POA: Diagnosis present

## 2022-07-05 DIAGNOSIS — E785 Hyperlipidemia, unspecified: Secondary | ICD-10-CM | POA: Insufficient documentation

## 2022-07-05 HISTORY — DX: Atherosclerotic heart disease of native coronary artery without angina pectoris: I25.10

## 2022-07-05 HISTORY — PX: CORONARY STENT INTERVENTION: CATH118234

## 2022-07-05 HISTORY — PX: LEFT HEART CATH AND CORONARY ANGIOGRAPHY: CATH118249

## 2022-07-05 LAB — BASIC METABOLIC PANEL
Anion gap: 8 (ref 5–15)
BUN: 14 mg/dL (ref 8–23)
CO2: 27 mmol/L (ref 22–32)
Calcium: 8.9 mg/dL (ref 8.9–10.3)
Chloride: 104 mmol/L (ref 98–111)
Creatinine, Ser: 0.75 mg/dL (ref 0.44–1.00)
GFR, Estimated: >60 mL/min (ref >60)
Glucose, Bld: 108 mg/dL — ABNORMAL HIGH (ref 70–99)
Potassium: 4.2 mmol/L (ref 3.5–5.1)
Sodium: 139 mmol/L (ref 135–145)

## 2022-07-05 LAB — POCT ACTIVATED CLOTTING TIME
Activated Clotting Time: 488 seconds
Activated Clotting Time: 271 seconds

## 2022-07-05 LAB — TROPONIN I (HIGH SENSITIVITY)
Troponin I (High Sensitivity): 4 ng/L (ref <18)
Troponin I (High Sensitivity): 5 ng/L (ref <18)

## 2022-07-05 LAB — CBC
HCT: 40.5 % (ref 36.0–46.0)
Hemoglobin: 13.2 g/dL (ref 12.0–15.0)
MCH: 30.1 pg (ref 26.0–34.0)
MCHC: 32.6 g/dL (ref 30.0–36.0)
MCV: 92.3 fL (ref 80.0–100.0)
Platelets: 171 10*3/uL (ref 150–400)
RBC: 4.39 MIL/uL (ref 3.87–5.11)
RDW: 12.3 % (ref 11.5–15.5)
WBC: 3.9 10*3/uL — ABNORMAL LOW (ref 4.0–10.5)
nRBC: 0 % (ref 0.0–0.2)

## 2022-07-05 LAB — HEPATIC FUNCTION PANEL
ALT: 11 U/L (ref 0–44)
AST: 20 U/L (ref 15–41)
Albumin: 4.1 g/dL (ref 3.5–5.0)
Alkaline Phosphatase: 48 U/L (ref 38–126)
Bilirubin, Direct: <0.1 mg/dL (ref 0.0–0.2)
Total Bilirubin: 0.6 mg/dL (ref 0.3–1.2)
Total Protein: 6.6 g/dL (ref 6.5–8.1)

## 2022-07-05 LAB — LIPASE, BLOOD: Lipase: 30 U/L (ref 11–51)

## 2022-07-05 SURGERY — LEFT HEART CATH AND CORONARY ANGIOGRAPHY
Anesthesia: Moderate Sedation

## 2022-07-05 MED ORDER — DIPHENHYDRAMINE HCL 50 MG/ML IJ SOLN
INTRAMUSCULAR | Status: AC
  Filled 2022-07-05: qty 1

## 2022-07-05 MED ORDER — METHYLPREDNISOLONE SODIUM SUCC 125 MG IJ SOLR
INTRAMUSCULAR | Status: AC
  Filled 2022-07-05: qty 2

## 2022-07-05 MED ORDER — PANTOPRAZOLE SODIUM 40 MG PO TBEC
40.0000 mg | DELAYED_RELEASE_TABLET | Freq: Every day | ORAL | Status: DC
  Administered 2022-07-05: 40 mg via ORAL
  Filled 2022-07-05 (×2): qty 1

## 2022-07-05 MED ORDER — SODIUM CHLORIDE 0.9% FLUSH: 3.0000 mL | INTRAVENOUS | Status: DC | PRN

## 2022-07-05 MED ORDER — CLOPIDOGREL BISULFATE 75 MG PO TABS
75.0000 mg | ORAL_TABLET | Freq: Every day | ORAL | Status: DC
  Administered 2022-07-06: 75 mg via ORAL
  Filled 2022-07-05: qty 1

## 2022-07-05 MED ORDER — MECLIZINE HCL 25 MG PO TABS: 12.5000 mg | ORAL_TABLET | Freq: Four times a day (QID) | ORAL | Status: DC | PRN

## 2022-07-05 MED ORDER — VERAPAMIL HCL 2.5 MG/ML IV SOLN
INTRAVENOUS | Status: DC | PRN
  Administered 2022-07-05: 2.5 mg via INTRA_ARTERIAL

## 2022-07-05 MED ORDER — VITAMIN C 500 MG PO TABS
250.0000 mg | ORAL_TABLET | Freq: Every day | ORAL | Status: DC
  Administered 2022-07-05: 250 mg via ORAL
  Filled 2022-07-05 (×2): qty 1

## 2022-07-05 MED ORDER — ASPIRIN 81 MG PO CHEW
81.0000 mg | CHEWABLE_TABLET | Freq: Every day | ORAL | Status: DC
  Administered 2022-07-06: 81 mg via ORAL
  Filled 2022-07-05: qty 1

## 2022-07-05 MED ORDER — QUETIAPINE FUMARATE 25 MG PO TABS
25.0000 mg | ORAL_TABLET | Freq: Every day | ORAL | Status: DC
  Administered 2022-07-05: 25 mg via ORAL
  Filled 2022-07-05: qty 1

## 2022-07-05 MED ORDER — SODIUM CHLORIDE 0.9 % WEIGHT BASED INFUSION: 1.0000 mL/kg/h | INTRAVENOUS | Status: DC

## 2022-07-05 MED ORDER — SODIUM CHLORIDE 0.9% FLUSH: 3.0000 mL | Freq: Two times a day (BID) | INTRAVENOUS | Status: DC

## 2022-07-05 MED ORDER — IOHEXOL 300 MG/ML  SOLN
INTRAMUSCULAR | Status: DC | PRN
  Administered 2022-07-05: 102 mL

## 2022-07-05 MED ORDER — DIPHENHYDRAMINE HCL 50 MG/ML IJ SOLN
INTRAMUSCULAR | Status: DC | PRN
  Administered 2022-07-05: 50 mg via INTRAVENOUS

## 2022-07-05 MED ORDER — ASPIRIN 81 MG PO CHEW
CHEWABLE_TABLET | ORAL | Status: AC
  Filled 2022-07-05: qty 1

## 2022-07-05 MED ORDER — POLYETHYLENE GLYCOL 3350 17 G PO PACK: 17.0000 g | PACK | Freq: Every day | ORAL | Status: DC | PRN

## 2022-07-05 MED ORDER — FENTANYL CITRATE (PF) 100 MCG/2ML IJ SOLN
INTRAMUSCULAR | Status: DC | PRN
  Administered 2022-07-05: 25 ug via INTRAVENOUS

## 2022-07-05 MED ORDER — HEPARIN (PORCINE) IN NACL 1000-0.9 UT/500ML-% IV SOLN
INTRAVENOUS | Status: AC
  Filled 2022-07-05: qty 1000

## 2022-07-05 MED ORDER — VERAPAMIL HCL 2.5 MG/ML IV SOLN
INTRAVENOUS | Status: AC
  Filled 2022-07-05: qty 2

## 2022-07-05 MED ORDER — HEPARIN SODIUM (PORCINE) 1000 UNIT/ML IJ SOLN
INTRAMUSCULAR | Status: AC
  Filled 2022-07-05: qty 10

## 2022-07-05 MED ORDER — NITROGLYCERIN 1 MG/10 ML FOR IR/CATH LAB
INTRA_ARTERIAL | Status: AC
  Filled 2022-07-05: qty 10

## 2022-07-05 MED ORDER — SODIUM CHLORIDE 0.9 % WEIGHT BASED INFUSION
3.0000 mL/kg/h | INTRAVENOUS | Status: DC
  Administered 2022-07-05: 3 mL/kg/h via INTRAVENOUS

## 2022-07-05 MED ORDER — ASPIRIN 81 MG PO CHEW
81.0000 mg | CHEWABLE_TABLET | ORAL | Status: AC
  Administered 2022-07-05: 81 mg via ORAL

## 2022-07-05 MED ORDER — CLOPIDOGREL BISULFATE 75 MG PO TABS
ORAL_TABLET | ORAL | Status: AC
  Filled 2022-07-05: qty 8

## 2022-07-05 MED ORDER — HEPARIN SODIUM (PORCINE) 1000 UNIT/ML IJ SOLN
INTRAMUSCULAR | Status: DC | PRN
  Administered 2022-07-05 (×2): 4000 [IU] via INTRAVENOUS

## 2022-07-05 MED ORDER — ONDANSETRON HCL 4 MG/2ML IJ SOLN: 4.0000 mg | Freq: Four times a day (QID) | INTRAMUSCULAR | Status: DC | PRN

## 2022-07-05 MED ORDER — VITAMIN D 25 MCG (1000 UNIT) PO TABS
1000.0000 [IU] | ORAL_TABLET | Freq: Every day | ORAL | Status: DC
  Filled 2022-07-05: qty 1

## 2022-07-05 MED ORDER — SODIUM CHLORIDE 0.9% FLUSH
3.0000 mL | Freq: Two times a day (BID) | INTRAVENOUS | Status: DC
  Administered 2022-07-06 (×2): 3 mL via INTRAVENOUS

## 2022-07-05 MED ORDER — ASPIRIN 81 MG PO CHEW
CHEWABLE_TABLET | ORAL | Status: AC
  Filled 2022-07-05: qty 2

## 2022-07-05 MED ORDER — VITAMIN B-12 1000 MCG PO TABS
500.0000 ug | ORAL_TABLET | Freq: Every day | ORAL | Status: DC
  Filled 2022-07-05: qty 1

## 2022-07-05 MED ORDER — ASPIRIN 81 MG PO TBEC: 81.0000 mg | DELAYED_RELEASE_TABLET | Freq: Every day | ORAL | Status: DC

## 2022-07-05 MED ORDER — FENTANYL CITRATE (PF) 100 MCG/2ML IJ SOLN
INTRAMUSCULAR | Status: AC
  Filled 2022-07-05: qty 2

## 2022-07-05 MED ORDER — MIDAZOLAM HCL 2 MG/2ML IJ SOLN
INTRAMUSCULAR | Status: AC
  Filled 2022-07-05: qty 2

## 2022-07-05 MED ORDER — ASPIRIN 81 MG PO CHEW
CHEWABLE_TABLET | ORAL | Status: DC | PRN
  Administered 2022-07-05: 162 mg via ORAL

## 2022-07-05 MED ORDER — CLOPIDOGREL BISULFATE 75 MG PO TABS
ORAL_TABLET | ORAL | Status: DC | PRN
  Administered 2022-07-05: 600 mg via ORAL

## 2022-07-05 MED ORDER — SODIUM CHLORIDE 0.9 % IV SOLN: 250.0000 mL | INTRAVENOUS | Status: DC | PRN

## 2022-07-05 MED ORDER — ORAL CARE MOUTH RINSE: 15.0000 mL | OROMUCOSAL | Status: DC | PRN

## 2022-07-05 MED ORDER — NITROGLYCERIN 1 MG/10 ML FOR IR/CATH LAB
INTRA_ARTERIAL | Status: DC | PRN
  Administered 2022-07-05: 200 ug via INTRACORONARY

## 2022-07-05 MED ORDER — HEPARIN (PORCINE) IN NACL 1000-0.9 UT/500ML-% IV SOLN
INTRAVENOUS | Status: DC | PRN
  Administered 2022-07-05 (×2): 500 mL

## 2022-07-05 MED ORDER — ACETAMINOPHEN 325 MG PO TABS: 650.0000 mg | ORAL_TABLET | ORAL | Status: DC | PRN

## 2022-07-05 MED ORDER — METHYLPREDNISOLONE SODIUM SUCC 125 MG IJ SOLR
INTRAMUSCULAR | Status: DC | PRN
  Administered 2022-07-05: 125 mg via INTRAVENOUS

## 2022-07-05 MED ORDER — SODIUM CHLORIDE 0.9 % WEIGHT BASED INFUSION: 1.0000 mL/kg/h | INTRAVENOUS | Status: AC

## 2022-07-05 MED ORDER — ROSUVASTATIN CALCIUM 10 MG PO TABS
20.0000 mg | ORAL_TABLET | Freq: Every evening | ORAL | Status: DC
  Administered 2022-07-05: 20 mg via ORAL
  Filled 2022-07-05: qty 2

## 2022-07-05 SURGICAL SUPPLY — 17 items
BALLN ~~LOC~~ TREK NEO RX 3.25X8 (BALLOONS) IMPLANT
CATH INFINITI 5FR JK (CATHETERS) IMPLANT
CATH INFINITI JR4 5F (CATHETERS) IMPLANT
CATH LAUNCHER 6FR JR4 (CATHETERS) IMPLANT
DEVICE RAD TR BAND REGULAR (VASCULAR PRODUCTS) IMPLANT
DRAPE BRACHIAL (DRAPES) IMPLANT
GLIDESHEATH SLEND SS 6F .021 (SHEATH) IMPLANT
GUIDEWIRE INQWIRE 1.5J.035X260 (WIRE) IMPLANT
INQWIRE 1.5J .035X260CM (WIRE) ×1
KIT ENCORE 26 ADVANTAGE (KITS) IMPLANT
PACK CARDIAC CATH (CUSTOM PROCEDURE TRAY) ×1 IMPLANT
PROTECTION STATION PRESSURIZED (MISCELLANEOUS) ×1
SET ATX-X65L (MISCELLANEOUS) IMPLANT
STATION PROTECTION PRESSURIZED (MISCELLANEOUS) IMPLANT
STENT ONYX FRONTIER 3.0X12 (Permanent Stent) IMPLANT
TUBING CIL FLEX 10 FLL-RA (TUBING) IMPLANT
WIRE RUNTHROUGH .014X180CM (WIRE) IMPLANT

## 2022-07-05 NOTE — H&P (View-Only) (Signed)
ELECTROPHYSIOLOGY CONSULT NOTE  Patient ID: Jacqueline Kaiser, MRN: [id number], DOB/AGE: November 27, 1945 77 y.o. Admit date: (Not on file) Date of Consult: 07/05/2022  Primary Physician: Steele Sizer, MD Primary Cardiologist: new     Jacqueline Kaiser is a 77 y.o. female who is being seen today for the evaluation of chest pain at the request of KS.    HPI Jacqueline Kaiser is a 77 y.o. female seen in consultation today because of recent onset chest discomfort.  She describes it as heaviness felt both anteriorly and posteriorly with radiation into the right neck.  Episodes typically last 2-3 minutes and then resolve, are unrelated to exertion eating.  There is no mitigating factors.  The frequency of these events has increased over the last days.  She is not particularly ambulatory as she had leg surgery 2/24; there has not been predominant symptom of shortness of breath with these episodes or since her surgery and no unilateral swelling  Known hyperlipidemia.     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   Right, .4 x 1.3 cm, noted on [place] ABD 05/2016, left lobe of the liver with the largest measuring 9.9 mm in diameter [place] ABD 06/2010   Low back sprain    Nonsustained ventricular tachycardia    by Holter,  noraml Stress test ECHO perpatient [name], [place])   Obese    Osteoporosis    Sinusitis    Urinary frequency    Wrist fracture    Left      Surgical History:  Past Surgical History:  Procedure Laterality Date   ABDOMINAL HYSTERECTOMY     BREAST BIOPSY Right    negative 10-12 years ago- core   CESAREAN SECTION     COLONOSCOPY   2012   CYSTOCELE REPAIR N/A 06/19/2018   Procedure: CYSTOSCOPY ANTERIOR REPAIR (CYSTOCELE);  Surgeon: Bjorn Loser, MD;  Location: [facility];  Service: Urology;  Laterality: N/A;   TONSILLECTOMY       Home Meds: Current Meds  Medication Sig   Ascorbic Acid (VITAMIN C) 100 MG tablet Take 100 mg by mouth daily.   aspirin EC 81 MG tablet Take 1 tablet (81 mg total) by mouth daily. Swallow whole.   calcium carbonate (OS-CAL) 600 MG TABS tablet Take by mouth.   cholecalciferol (VITAMIN D3) 25 MCG (1000 UT) tablet Take 1,000 Units by mouth daily.   denosumab (PROLIA) 60 MG/ML SOSY injection Inject 1 mL into the skin every 6 (six) months.   meclizine (ANTIVERT) 12.5 MG tablet Take 1 tablet (12.5 mg total) by mouth 4 (four) times daily as needed for dizziness.   omeprazole (PRILOSEC) 20 MG capsule Take 20 mg by mouth daily.   polyethylene glycol powder (GLYCOLAX/MIRALAX) 17 GM/SCOOP powder Take 17 g by mouth once. PRN   QUEtiapine (SEROQUEL) 25 MG tablet Take 1 tablet (25 mg total) by mouth at bedtime.   rosuvastatin (CRESTOR) 20 MG  tablet TAKE 1 TABLET BY MOUTH EVERY EVENING   vitamin B-12 (CYANOCOBALAMIN) 500 MCG tablet Take 500 mcg by mouth daily.    Allergies:  Allergies  Allergen Reactions   Betadine [Povidone Iodine] Other (See Comments)    Burns   Iodine     Social History   Socioeconomic History   Marital status: Married    Spouse name: Tim   Number of children: 2   Years of education: Not on file   Highest education level: 12th grade  Occupational History   Occupation: Retired  Tobacco Use   Smoking status: Former    Packs/day: 0.50    Years: 10.00    Additional pack years: 0.00    Total pack years: 5.00    Types: Cigarettes    Quit date: 04/04/1978    Years since quitting: 44.2   Smokeless tobacco: Never   Tobacco comments:    smoking cessation materials not required  Vaping Use   Vaping Use: Never used  Substance and Sexual Activity   Alcohol use: Not  Currently    Comment: wine occasionally   Drug use: No   Sexual activity: Not Currently    Birth control/protection: Surgical  Other Topics Concern   Not on file  Social History Narrative   Works part time at [employer] Strain: [place]  (07/03/2022)   Overall Financial Resource Strain (CARDIA)    Difficulty of Paying Living Expenses: Not hard at all  Food Insecurity: No Food Insecurity (07/03/2022)   Hunger Vital Sign    Worried About Running Out of Food in the Last Year: Never true    Ran Out of Food in the Last Year: Never true  Transportation Needs: No Transportation Needs (07/03/2022)   PRAPARE - Hydrologist (Medical): No    Lack of Transportation (Non-Medical): No  Physical Activity: Insufficiently Active (07/03/2022)   Exercise Vital Sign    Days of Exercise per Week: 4 days    Minutes of Exercise per Session: 20 min  Stress: No Stress Concern Present (07/03/2022)   [place]    Feeling of Stress : Not at all  Social Connections: Unknown (07/03/2022)   Social Connection and Isolation Panel [NHANES]    Frequency of Communication with Friends and Family: More than three times a week    Frequency of Social Gatherings with Friends and Family: Three times a week    Attends Religious Services: Patient declined    Active Member of Clubs or Organizations: No    Attends Archivist Meetings: Not on file    Marital Status: Married  Human resources officer Violence: Not At Risk (01/12/2021)   Humiliation, Afraid, Rape, and Kick questionnaire    Fear of Current or Ex-Partner: No    Emotionally Abused: No    Physically Abused: No    Sexually Abused: No     Family History  Problem Relation Age of Onset   Heart disease Mother        ATRIAL FIB   Cancer Father 103       Lung Ca,  nonsmoker, metastatic at diagnosis  asbestos exposure    Breast cancer Daughter 49       dx at age of 48 and 80     ROS:  Please see the history of present illness.     All other systems reviewed and negative.  Physical Exam: Blood pressure 114/72, pulse 62, height 5\' 7"  (1.702 m), weight 185 lb 9.6 oz (84.2 kg), SpO2 99 %. General: Well developed, well nourished female in no acute distress. Head: Normocephalic, atraumatic, sclera non-icteric, no xanthomas, nares are without discharge. EENT: normal  Lymph Nodes:  none Neck: Negative for carotid bruits. JVD not elevated. Back:without scoliosis kyphosis Lungs: Clear bilaterally to auscultation without wheezes, rales, or rhonchi. Breathing is unlabored. Heart: RRR with S1 S2. No murmur . No rubs, or gallops appreciated. Abdomen: Soft, non-tender, non-distended with normoactive bowel sounds. No hepatomegaly. No rebound/guarding. No obvious abdominal masses. Msk:  Strength and tone appear normal for age. Extremities: No clubbing or cyanosis. No  edema.  Distal pedal pulses are 2+ and equal bilaterally. Skin: Warm and Dry Neuro: Alert and oriented X 3. CN III-XII intact Grossly normal sensory and motor function . Psych:  Responds to questions appropriately with a normal affect.        EKG: gione   Assessment and Plan:  Chest pain concerning for angina with accelerating pattern  Abnormal ECG  Tinnitus  Remote history of vertigo   The patient has chest pain concerning for angina, it is accelerating pattern, I am going to recommend going to the emergency room for further evaluation and diagnostic testing.  Have discussed with Dr MA and have reviewed cath with patient and daughter   Tinnitus preceded the chest pain, problematic  will need further evaluation    Virl Axe

## 2022-07-05 NOTE — OR Nursing (Signed)
Patient did well in recovery, Hematoma did form at the start of deflation, pressure held and hematoma minimized, small and soft, stable, border was outlined, large bruising noted, no bleeding noted, patient tolerated food and drink and transferred to room in stable condition.  Ortencia Kick, RN

## 2022-07-05 NOTE — ED Triage Notes (Signed)
Pt here with cp x1 week. Pt states pain is centered and radiates to her neck and back. Pt states pain is intermittent and is tight in nature. Pt denies NVD.

## 2022-07-05 NOTE — Patient Instructions (Signed)
Medication Instructions:  Your physician recommends that you continue on your current medications as directed. Please refer to the Current Medication list given to you today.  *If you need a refill on your cardiac medications before your next appointment, please call your pharmacy*   Lab Work: None ordered.  If you have labs (blood work) drawn today and your tests are completely normal, you will receive your results only by: [place] (if you have MyChart) OR A paper copy in the mail If you have any lab test that is abnormal or we need to change your treatment, we will call you to review the results.   Testing/Procedures: None ordered.    Follow-Up: At [facility], you and your health needs are our priority.  As part of our continuing mission to provide you with exceptional heart care, we have created designated Provider Care Teams.  These Care Teams include your primary Cardiologist (physician) and Advanced Practice Providers (APPs -  Physician Assistants and Nurse Practitioners) who all work together to provide you with the care you need, when you need it.  We recommend signing up for the patient portal called "MyChart".  Sign up information is provided on this After Visit Summary.  MyChart is used to connect with patients for Virtual Visits (Telemedicine).  Patients are able to view lab/test results, encounter notes, upcoming appointments, etc.  Non-urgent messages can be sent to your provider as well.   To learn more about what you can do with MyChart, go to [email or web address].    Your next appointment:   To be determined  Other Instructions Dr Caryl Comes recommends you be seen in the ED now

## 2022-07-05 NOTE — ED Notes (Signed)
Pt transferred to Cath Lab at this time.

## 2022-07-05 NOTE — ED Provider Notes (Signed)
[facility] Provider Note    Event Date/Time   First MD Initiated Contact with Patient 07/05/22 1016     (approximate)   History   Chest Pain   HPI  Jacqueline Kaiser is a 77 y.o. female   presents to the ED with complaint of chest pain for 1 week.  Patient states that she was seen by a PA yesterday who referred her to Dr. Adam Phenix, cardiologist and was seen by him today.  Patient reports that she was sent to the emergency department for further testing.  Patient denies any previous cardiac history.  She does feel that she gets slightly dizzy when she has episodes that last 2 to 3 minutes in her central chest and radiating to her back and into the right side of her neck.  She denies any difficulty breathing, palpitations, diaphoresis, nausea or vomiting.  She states that is unrelated to eating or exertion.      Physical Exam   Triage Vital Signs: ED Triage Vitals [07/05/22 1002]  Enc Vitals Group     BP (!) 154/87     Pulse Rate (!) 58     Resp 18     Temp 98.2 F (36.8 C)     Temp Source Oral     SpO2 97 %     Weight 185 lb 10 oz (84.2 kg)     Height 5\' 7"  (1.702 m)     Head Circumference      Peak Flow      Pain Score 4     Pain Loc      Pain Edu?      Excl. in [place]?     Most recent vital signs: Vitals:   07/05/22 1200 07/05/22 1400  BP: (!) 119/102 (!) 148/72  Pulse: 62 (!) 57  Resp: 15 10  Temp:  98.2 F (36.8 C)  SpO2: 100% 99%     General: Awake, no distress.  CV:  Good peripheral perfusion.  Heart regular rate and rhythm. Resp:  Normal effort.  Lungs are clear bilaterally. Abd:  No distention.  Soft, nontender, bowel sounds normoactive x 4 quadrants. Other:     ED Results / Procedures / Treatments   Labs (all labs ordered are listed, but only abnormal results are displayed) Labs Reviewed  BASIC METABOLIC PANEL - Abnormal; Notable for the following components:      Result Value   Glucose, Bld 108 (*)    All other  components within normal limits  CBC - Abnormal; Notable for the following components:   WBC 3.9 (*)    All other components within normal limits  LIPASE, BLOOD  HEPATIC FUNCTION PANEL  TROPONIN I (HIGH SENSITIVITY)  TROPONIN I (HIGH SENSITIVITY)     EKG  Vent. rate 61 BPM PR interval 122 ms QRS duration 84 ms QT/QTcB 416/418 ms P-R-T axes 54 58 38 Normal sinus rhythm Nonspecific ST and T wave abnormality Abnormal ECG No previous ECGs available    RADIOLOGY  Chest x-ray images were reviewed with no frank infiltrate noted. Ultrasound right upper quadrant is negative for findings per radiologist and unchanged hepatic cyst.   PROCEDURES:  Critical Care performed:   Procedures   MEDICATIONS ORDERED IN ED: Medications  sodium chloride flush (NS) 0.9 % injection 3 mL (has no administration in time range)  sodium chloride flush (NS) 0.9 % injection 3 mL (has no administration in time range)  0.9 %  sodium chloride infusion (  has no administration in time range)  aspirin chewable tablet 81 mg (has no administration in time range)  0.9% sodium chloride infusion (has no administration in time range)    Followed by  0.9% sodium chloride infusion (has no administration in time range)     IMPRESSION / MDM / ASSESSMENT AND PLAN / ED COURSE  I reviewed the triage vital signs and the nursing notes.   Differential diagnosis includes, but is not limited to, chest pain, cardiac event, GERD, pneumonia, esophagitis, cholelithiasis, cholecystitis.  ----------------------------------------- 1:07 PM on 07/05/2022 ----------------------------------------- I have discussed patient labs and history with Jacqueline Citizen, PA-C who is taking over the care of this patient.  Ultrasound right upper quadrant is still pending.  Lab work was discussed with the patient.  ----------------------------------------- 3:29 PM on 07/05/2022 ----------------------------------------- Ultrasound  findings have resulted.  Patient currently is going to the Cath Lab per nursing staff.  This was prearranged by Dr. Fletcher Anon, cardiology prior to her ER visit and registration.      Patient's presentation is most consistent with acute illness / injury with system symptoms.  FINAL CLINICAL IMPRESSION(S) / ED DIAGNOSES   Final diagnoses:  Chest pain of unknown etiology     Rx / DC Orders   ED Discharge Orders     None        Note:  This document was prepared using Dragon voice recognition software and may include unintentional dictation errors.   Jacqueline Hai, PA-C 07/05/22 1533    Jacqueline Mew, MD 07/06/22 [phone]

## 2022-07-05 NOTE — Interval H&P Note (Signed)
History and Physical Interval Note:  07/05/2022 4:46 PM  Jacqueline Kaiser  has presented today for surgery, with the diagnosis of unstable angina.  The various methods of treatment have been discussed with the patient and family. After consideration of risks, benefits and other options for treatment, the patient has consented to  Procedure(s): LEFT HEART CATH AND CORONARY ANGIOGRAPHY (N/A) as a surgical intervention.  The patient's history has been reviewed, patient examined, no change in status, stable for surgery.  I have reviewed the patient's chart and labs.  Questions were answered to the patient's satisfaction.     Kathlyn [place]

## 2022-07-05 NOTE — ED Notes (Signed)
Pt ambulatory to bathroom independently with steady gait. Pt denies chest pain with ambulation.

## 2022-07-05 NOTE — Progress Notes (Signed)
ELECTROPHYSIOLOGY CONSULT NOTE  Patient ID: Jacqueline Kaiser, MRN: [id number], DOB/AGE: 79/21/1947 79 y.o. Admit date: (Not on file) Date of Consult: 07/05/2022  Primary Physician: Steele Sizer, MD Primary Cardiologist: new     Jacqueline Kaiser is a 77 y.o. female who is being seen today for the evaluation of chest pain at the request of [place].    HPI Jacqueline Kaiser is a 77 y.o. female seen in consultation today because of recent onset chest discomfort.  She describes it as heaviness felt both anteriorly and posteriorly with radiation into the right neck.  Episodes typically last 2-3 minutes and then resolve, are unrelated to exertion eating.  There is no mitigating factors.  The frequency of these events has increased over the last days.  She is not particularly ambulatory as she had leg surgery 2/24; there has not been predominant symptom of shortness of breath with these episodes or since her surgery and no unilateral swelling  Known hyperlipidemia.     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   Right, .4 x 1.3 cm, noted on [place] ABD 05/2016, left lobe of the liver with the largest measuring 9.9 mm in diameter [place] ABD 06/2010   Low back sprain    Nonsustained ventricular tachycardia    by Holter,  noraml Stress test ECHO perpatient [name], [place])   Obese    Osteoporosis    Sinusitis    Urinary frequency    Wrist fracture    Left      Surgical History:  Past Surgical History:  Procedure Laterality Date   ABDOMINAL HYSTERECTOMY     BREAST BIOPSY Right    negative 10-12 years ago- core   CESAREAN SECTION     COLONOSCOPY   2012   CYSTOCELE REPAIR N/A 06/19/2018   Procedure: CYSTOSCOPY ANTERIOR REPAIR (CYSTOCELE);  Surgeon: Bjorn Loser, MD;  Location: [facility];  Service: Urology;  Laterality: N/A;   TONSILLECTOMY       Home Meds: Current Meds  Medication Sig   Ascorbic Acid (VITAMIN C) 100 MG tablet Take 100 mg by mouth daily.   aspirin EC 81 MG tablet Take 1 tablet (81 mg total) by mouth daily. Swallow whole.   calcium carbonate (OS-CAL) 600 MG TABS tablet Take by mouth.   cholecalciferol (VITAMIN D3) 25 MCG (1000 UT) tablet Take 1,000 Units by mouth daily.   denosumab (PROLIA) 60 MG/ML SOSY injection Inject 1 mL into the skin every 6 (six) months.   meclizine (ANTIVERT) 12.5 MG tablet Take 1 tablet (12.5 mg total) by mouth 4 (four) times daily as needed for dizziness.   omeprazole (PRILOSEC) 20 MG capsule Take 20 mg by mouth daily.   polyethylene glycol powder (GLYCOLAX/MIRALAX) 17 GM/SCOOP powder Take 17 g by mouth once. PRN   QUEtiapine (SEROQUEL) 25 MG tablet Take 1 tablet (25 mg total) by mouth at bedtime.   rosuvastatin (CRESTOR) 20 MG  tablet TAKE 1 TABLET BY MOUTH EVERY EVENING   vitamin B-12 (CYANOCOBALAMIN) 500 MCG tablet Take 500 mcg by mouth daily.    Allergies:  Allergies  Allergen Reactions   Betadine [Povidone Iodine] Other (See Comments)    [place]   Iodine     Social History   Socioeconomic History   Marital status: Married    Spouse name: Tim   Number of children: 2   Years of education: Not on file   Highest education level: 12th grade  Occupational History   Occupation: Retired  Tobacco Use   Smoking status: Former    Packs/day: 0.50    Years: 10.00    Additional pack years: 0.00    Total pack years: 5.00    Types: Cigarettes    Quit date: 04/04/1978    Years since quitting: 44.2   Smokeless tobacco: Never   Tobacco comments:    smoking cessation materials not required  Vaping Use   Vaping Use: Never used  Substance and Sexual Activity   Alcohol use: Not  Currently    Comment: wine occasionally   Drug use: No   Sexual activity: Not Currently    Birth control/protection: Surgical  Other Topics Concern   Not on file  Social History Narrative   Works part time at [place] Strain: [place]  (07/03/2022)   Overall Financial Resource Strain (CARDIA)    Difficulty of Paying Living Expenses: Not hard at all  Food Insecurity: No Food Insecurity (07/03/2022)   Hunger Vital Sign    Worried About Running Out of Food in the Last Year: Never true    Ran Out of Food in the Last Year: Never true  Transportation Needs: No Transportation Needs (07/03/2022)   PRAPARE - Hydrologist (Medical): No    Lack of Transportation (Non-Medical): No  Physical Activity: Insufficiently Active (07/03/2022)   Exercise Vital Sign    Days of Exercise per Week: 4 days    Minutes of Exercise per Session: 20 min  Stress: No Stress Concern Present (07/03/2022)   [place]    Feeling of Stress : Not at all  Social Connections: Unknown (07/03/2022)   Social Connection and Isolation Panel [NHANES]    Frequency of Communication with Friends and Family: More than three times a week    Frequency of Social Gatherings with Friends and Family: Three times a week    Attends Religious Services: Patient declined    Active Member of Clubs or Organizations: No    Attends Archivist Meetings: Not on file    Marital Status: Married  Human resources officer Violence: Not At Risk (01/12/2021)   Humiliation, Afraid, Rape, and Kick questionnaire    Fear of Current or Ex-Partner: No    Emotionally Abused: No    Physically Abused: No    Sexually Abused: No     Family History  Problem Relation Age of Onset   Heart disease Mother        ATRIAL FIB   Cancer Father 78       Lung Ca,  nonsmoker, metastatic at diagnosis  asbestos exposure    Breast cancer Daughter 56       dx at age of 60 and 51     ROS:  Please see the history of present illness.     All other systems reviewed and negative.  Physical Exam: Blood pressure 114/72, pulse 62, height 5\' 7"  (1.702 m), weight 185 lb 9.6 oz (84.2 kg), SpO2 99 %. General: Well developed, well nourished female in no acute distress. Head: Normocephalic, atraumatic, sclera non-icteric, no xanthomas, nares are without discharge. EENT: normal  Lymph Nodes:  none Neck: Negative for carotid bruits. JVD not elevated. Back:without scoliosis kyphosis Lungs: Clear bilaterally to auscultation without wheezes, rales, or rhonchi. Breathing is unlabored. Heart: RRR with S1 S2. No murmur . No rubs, or gallops appreciated. Abdomen: Soft, non-tender, non-distended with normoactive bowel sounds. No hepatomegaly. No rebound/guarding. No obvious abdominal masses. Msk:  Strength and tone appear normal for age. Extremities: No clubbing or cyanosis. No  edema.  Distal pedal pulses are 2+ and equal bilaterally. Skin: Warm and Dry Neuro: Alert and oriented X 3. CN III-XII intact Grossly normal sensory and motor function . Psych:  Responds to questions appropriately with a normal affect.        EKG: gione   Assessment and Plan:  Chest pain concerning for angina with accelerating pattern  Abnormal ECG  Tinnitus  Remote history of vertigo   The patient has chest pain concerning for angina, it is accelerating pattern, I am going to recommend going to the emergency room for further evaluation and diagnostic testing.  Have discussed with Dr MA and have reviewed cath with patient and daughter   Tinnitus preceded the chest pain, problematic  will need further evaluation    Virl Axe

## 2022-07-06 ENCOUNTER — Encounter: Payer: Self-pay | Admitting: Cardiovascular Disease

## 2022-07-06 ENCOUNTER — Telehealth: Payer: Self-pay | Admitting: Podiatry

## 2022-07-06 ENCOUNTER — Other Ambulatory Visit: Payer: Self-pay

## 2022-07-06 DIAGNOSIS — E785 Hyperlipidemia, unspecified: Secondary | ICD-10-CM | POA: Insufficient documentation

## 2022-07-06 DIAGNOSIS — I25110 Atherosclerotic heart disease of native coronary artery with unstable angina pectoris: Secondary | ICD-10-CM | POA: Diagnosis not present

## 2022-07-06 DIAGNOSIS — I2510 Atherosclerotic heart disease of native coronary artery without angina pectoris: Secondary | ICD-10-CM | POA: Insufficient documentation

## 2022-07-06 LAB — CBC
HCT: 38.8 % (ref 36.0–46.0)
Hemoglobin: 13.1 g/dL (ref 12.0–15.0)
MCH: 30 pg (ref 26.0–34.0)
MCHC: 33.8 g/dL (ref 30.0–36.0)
MCV: 89 fL (ref 80.0–100.0)
Platelets: 180 10*3/uL (ref 150–400)
RBC: 4.36 MIL/uL (ref 3.87–5.11)
RDW: 11.9 % (ref 11.5–15.5)
WBC: 3.7 10*3/uL — ABNORMAL LOW (ref 4.0–10.5)
nRBC: 0 % (ref 0.0–0.2)

## 2022-07-06 LAB — BASIC METABOLIC PANEL
Anion gap: 7 (ref 5–15)
BUN: 16 mg/dL (ref 8–23)
CO2: 23 mmol/L (ref 22–32)
Calcium: 8.7 mg/dL — ABNORMAL LOW (ref 8.9–10.3)
Chloride: 108 mmol/L (ref 98–111)
Creatinine, Ser: 0.78 mg/dL (ref 0.44–1.00)
GFR, Estimated: >60 mL/min (ref >60)
Glucose, Bld: 169 mg/dL — ABNORMAL HIGH (ref 70–99)
Potassium: 4 mmol/L (ref 3.5–5.1)
Sodium: 138 mmol/L (ref 135–145)

## 2022-07-06 MED ORDER — PANTOPRAZOLE SODIUM 40 MG PO TBEC: 40.0000 mg | DELAYED_RELEASE_TABLET | Freq: Every day | ORAL | 5 refills | Status: DC

## 2022-07-06 MED ORDER — CLOPIDOGREL BISULFATE 75 MG PO TABS: 75.0000 mg | ORAL_TABLET | Freq: Every day | ORAL | 11 refills | Status: DC

## 2022-07-06 NOTE — Telephone Encounter (Signed)
No, patient does not need to wear the brace anymore.  Just good supportive tennis shoes.  Please notify patient.  Thanks, Dr. Amalia Hailey

## 2022-07-06 NOTE — Telephone Encounter (Signed)
Pt wants to know if she has to continue wearing the brace; her next appt is sched for 4/23. Please advise

## 2022-07-06 NOTE — Discharge Summary (Signed)
Discharge Summary    Patient ID: Jacqueline Kaiser  MRN: [id number], DOB/AGE: 1945/08/24 77 y.o.  Admit Date: 07/05/2022 Discharge Date: 07/06/2022  Primary Care Provider: Steele Sizer, MD Primary Cardiologist: Dr. Caryl Comes, MD  Discharge Diagnoses    Principal Problem:   Unstable angina Active Problems:   CAD (coronary artery disease)   Hyperlipidemia LDL goal <70   Allergies Allergies  Allergen Reactions   Betadine [Povidone Iodine] Other (See Comments)    Burns   Iodine      History of Present Illness     77 year old female with history of hyperlipidemia, liver cyst, reported NSVT with prior normal stress test who was evaluated as a new patient in our office on 07/05/2022 for chest pain.  She was evaluated as a new patient in our office on 07/05/2022 at the request of her PCP for chest discomfort that was felt anteriorly and posteriorly with radiation to the right side of the neck and would typically last 2 to 3 minutes followed by spontaneous resolution.  Symptoms were concerning for accelerating angina given increase in frequency with recommendation for the patient to be transferred to the ED.  Hospital Course     Consultants: Cardiac rehab   In the ED, she remained hemodynamically stable and was without symptoms of angina.  Laboratory evaluation was unremarkable including high-sensitivity troponin x 2, lipase, hepatic function panel, renal function, and potassium.  Chest x-ray showed a hazy opacity at the left lung base favored to represent atelectasis.  Right upper quadrant ultrasound showed no acute sonographic findings with an unchanged right hepatic cyst.  She underwent LHC on 07/05/2022 which showed significant one-vessel CAD involving the RCA with estimated 80% mid to distal RCA stenosis.  This was treated successfully with PCI/DES.  Remaining Details showed mid LAD 30% stenosis, OM1 30% stenosis, mid RCA 30% stenosis, mildly elevated LVEDP, and an EF of 55 to 65%.  Post cath  she has done well, without symptoms of angina or cardiac decompensation.  She did develop a bruise along the right radial arteriotomy site without active bleeding.  Post cath vitals and labs stable.  Post cath EKG shows sinus bradycardia with nonspecific ST-T changes.  The patient's right radial arteriotomy site with noted soft bruising without active bleeding.  Radial pulse 2+ proximal and distal to the arteriotomy site.  No bruit.  The patient has been seen by Dr. Saunders Revel and felt to be stable for discharge today. All follow up appointments have been made. Discharge medications are listed below. Prescriptions have been reviewed with the patient and sent in to their pharmacy.    CAD involving the native coronary arteries with unstable angina: -Chest pain-free -Status post PCI/DES to the RCA -DAPT with ASA and clopidogrel without interruption for a minimum of 6 months -Post-cath instructions -Aggressive risk factor modifications -Cardiac rehab -PTA rosuvastatin -Consider outpatient echo  HLD: -LDL 81 in 01/2022 with goal being less than 70 -PTA rosuvastatin -Look to escalate dose as indicated in the office follow-up  History of reported NSVT: -Prior monitor at outside office unavailable for review -No evidence of NSVT on telemetry during admission -Not on beta-blocker due to sinus bradycardia  GERD: -Transition omeprazole to Protonix given clopidogrel use _____________  Discharge Vitals Blood pressure 129/71, pulse 60, temperature 97.6 F (36.4 C), resp. rate 16, height 5\' 6"  (1.676 m), weight 84 kg, SpO2 100 %.  Filed Weights   07/05/22 1002 07/05/22 1532 07/05/22 2100  Weight: 84.2 kg 82.1 kg 84 kg  GEN: No acute distress.   Neck: No JVD. Cardiac: RRR, no murmurs, rubs, or gallops.  Right radial arteriotomy site with soft bruising noted without active bleeding, swelling, warmth, erythema.  Radial pulse 2+ proximal and distal to the arteriotomy site.  No bruit. Respiratory: Clear  to auscultation bilaterally.  GI: Soft, nontender, non-distended.   MS: No edema; No deformity. Neuro:  Alert and oriented x 3; Nonfocal.  Psych: Normal affect.  Labs & Radiologic Studies    CBC Recent Labs    07/05/22 1006 07/06/22 0644  WBC 3.9* 3.7*  HGB 13.2 13.1  HCT 40.5 38.8  MCV 92.3 89.0  PLT 171 [id number]   Basic Metabolic Panel Recent Labs    07/05/22 1006 07/06/22 0644  NA 139 138  K 4.2 4.0  CL 104 108  CO2 27 23  GLUCOSE 108* 169*  BUN 14 16  CREATININE 0.75 0.78  CALCIUM 8.9 8.7*   Liver Function Tests Recent Labs    07/05/22 1006  AST 20  ALT 11  ALKPHOS 48  BILITOT 0.6  PROT 6.6  ALBUMIN 4.1   Recent Labs    07/05/22 1006  LIPASE 30   Cardiac Enzymes No results for input(s): "CKTOTAL", "CKMB", "CKMBINDEX", "TROPONINI" in the last 72 hours. BNP Invalid input(s): "POCBNP" D-Dimer No results for input(s): "DDIMER" in the last 72 hours. Hemoglobin A1C No results for input(s): "HGBA1C" in the last 72 hours. Fasting Lipid Panel No results for input(s): "CHOL", "HDL", "LDLCALC", "TRIG", "CHOLHDL", "LDLDIRECT" in the last 72 hours. Thyroid Function Tests No results for input(s): "TSH", "T4TOTAL", "T3FREE", "THYROIDAB" in the last 72 hours.  Invalid input(s): "FREET3" _____________   US Abdomen Limited RUQ (LIVER/GB)  Result Date: 07/05/2022 IMPRESSION: 1. No acute sonographic findings. 2. Unchanged right hepatic cyst. Electronically Signed   By: Darrin Nipper M.D.   On: 07/05/2022 13:43   DG Chest 2 View  Result Date: 07/05/2022 IMPRESSION: Hazy opacity at the left lung base is favored to represent atelectasis, but infection is not excluded. Electronically Signed   By: Marin Roberts M.D.   On: 07/05/2022 10:30    Diagnostic Studies/Procedures   LHC 07/05/2022:   1st Mrg lesion is 30% stenosed.   Mid LAD lesion is 30% stenosed.   Mid RCA to Dist RCA lesion is 80% stenosed.   Mid RCA lesion is 30% stenosed.   A drug-eluting stent was  successfully placed using a STENT ONYX FRONTIER 3.0X12.   Post intervention, there is a 0% residual stenosis.   The left ventricular systolic function is normal.   LV end diastolic pressure is mildly elevated.   The left ventricular ejection fraction is 55-65% by visual estimate.   1.  Significant one-vessel coronary artery disease involving the right coronary artery. 2.  Normal LV systolic function mildly elevated left ventricular end-diastolic pressure. 3.  Successful drug-eluting stent placement to the mid/distal right coronary artery.   Recommendations: Dual antiplatelet therapy for at least 6 months. Aggressive treatment of risk factors.  Diagnostic Dominance: Right Left Main  Vessel is angiographically normal.    Left Anterior Descending  The vessel exhibits minimal luminal irregularities.  Mid LAD lesion is 30% stenosed. The lesion is mildly calcified.    Left Circumflex  There is mild diffuse disease throughout the vessel.    First Obtuse Marginal Branch  1st Mrg lesion is 30% stenosed.    Right Coronary Artery  Mid RCA lesion is 30% stenosed.  Mid RCA to Dist RCA lesion is  80% stenosed.    Intervention   Mid RCA to Dist RCA lesion  Stent  Lesion length: 12 mm. CATH LAUNCHER [id number] guide catheter was inserted. Lesion crossed with guidewire using a WIRE RUNTHROUGH .[id number]. Pre-stent angioplasty was not performed. A drug-eluting stent was successfully placed using a STENT ONYX FRONTIER 3.0X12. Maximum pressure: 14 atm. Inflation time: 20 sec. Post-stent angioplasty was performed using a BALLN [place] TREK NEO RX 3.25X8. Maximum pressure: 14 atm. Inflation time: 20 sec.  Post-Intervention Lesion Assessment  The intervention was successful. Pre-interventional TIMI flow is 3. Post-intervention TIMI flow is 3. No complications occurred at this lesion.  There is a 0% residual stenosis post intervention.     Wall Motion              Left Heart  Left Ventricle The  left ventricular size is normal. The left ventricular systolic function is normal. LV end diastolic pressure is mildly elevated. The left ventricular ejection fraction is 55-65% by visual estimate. No regional wall motion abnormalities.   Coronary Diagrams  Diagnostic Dominance: Right  Intervention   _____________  Disposition   Pt is being discharged home today in good condition.  Follow-up Plans & Appointments     Follow-up Information     Rise Mu, PA-C Follow up on 07/25/2022.   Specialties: Cardiology, Radiology Why: Appointment time 2:45 PM Contact information: [address] [place] [place] [id number] [phone]                Discharge Instructions     AMB Referral to Cardiac Rehabilitation - Phase II   Complete by: As directed    Diagnosis: Coronary Stents   After initial evaluation and assessments completed: Virtual Based Care may be provided alone or in conjunction with Phase 2 Cardiac Rehab based on patient barriers.: Yes   Intensive Cardiac Rehabilitation (ICR) [place] location only OR Traditional Cardiac Rehabilitation (TCR) *If criteria for ICR are not met will enroll in TCR [facility] only): Yes   Diet - low sodium heart healthy   Complete by: As directed    Increase activity slowly   Complete by: As directed        Discharge Medications   Allergies as of 07/06/2022       Reactions   Betadine [povidone Iodine] Other (See Comments)   Burns   Iodine         Medication List     STOP taking these medications    omeprazole 20 MG capsule Commonly known as: PRILOSEC       TAKE these medications    aspirin EC 81 MG tablet Take 1 tablet (81 mg total) by mouth daily. Swallow whole.   calcium carbonate 600 MG Tabs tablet Commonly known as: OS-CAL Take by mouth.   cholecalciferol 25 MCG (1000 UNIT) tablet Commonly known as: VITAMIN D3 Take 1,000 Units by mouth daily.   clopidogrel 75 MG tablet Commonly known as:  PLAVIX Take 1 tablet (75 mg total) by mouth daily with breakfast.   cyanocobalamin 500 MCG tablet Commonly known as: VITAMIN B12 Take 500 mcg by mouth daily.   meclizine 12.5 MG tablet Commonly known as: ANTIVERT Take 1 tablet (12.5 mg total) by mouth 4 (four) times daily as needed for dizziness.   pantoprazole 40 MG tablet Commonly known as: PROTONIX Take 1 tablet (40 mg total) by mouth daily. Start taking on: July 07, 2022   polyethylene glycol powder 17 GM/SCOOP powder Commonly known as: GLYCOLAX/MIRALAX  Take 17 g by mouth once. PRN   Prolia 60 MG/ML Sosy injection Generic drug: denosumab Inject 1 mL into the skin every 6 (six) months.   QUEtiapine 25 MG tablet Commonly known as: SEROquel Take 1 tablet (25 mg total) by mouth at bedtime.   rosuvastatin 20 MG tablet Commonly known as: CRESTOR TAKE 1 TABLET BY MOUTH EVERY EVENING   vitamin C 100 MG tablet Take 100 mg by mouth daily.         Aspirin prescribed at discharge?  Yes High Intensity Statin Prescribed? (Lipitor 40-80mg  or Crestor 20-40mg ): Yes Beta Blocker Prescribed? No: Sinus bradycardia For EF <40%, was ACEI/ARB Prescribed? No: EF > 40% ADP Receptor Inhibitor Prescribed? (i.e. Plavix etc.-Includes Medically Managed Patients): Yes For EF <40%, Aldosterone Inhibitor Prescribed? No: Ef > 40% Was EF assessed during THIS hospitalization? Yes Was Cardiac Rehab II ordered? (Included Medically managed Patients): Yes   Outstanding Labs/Studies   None  Duration of Discharge Encounter   Greater than 30 minutes including physician time.  Signed, Rise Mu, PA-C [facility] Pager: [phone] 07/06/2022, 10:38 AM

## 2022-07-06 NOTE — Plan of Care (Signed)

## 2022-07-07 LAB — LIPOPROTEIN A (LPA): Lipoprotein (a): 61.6 nmol/L — ABNORMAL HIGH (ref <75.0)

## 2022-07-15 NOTE — Progress Notes (Unsigned)
Name: Jacqueline Kaiser   MRN: [id number]    DOB: January 14, 1946   Date:07/18/2022       Progress Note  Subjective  Chief Complaint  Hospital Follow-Up  HPI  Admitted: 07/05/22 Discharged: 07/06/22  She was having unstable  angina symptoms mid March that with recurrently left side chest pain, she saw me on 04/01, saw cardiologist on April 2nd and was sent to [facility] , had cardiac cath that showed findings below. She states since she went home denies any chest pain, she does not want go to cardiac rehab due to cost, but has taking medications as prescribed ( medication reconciliation was done) . She will be on plavix , aspirin and statin therapy. 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   Mid LAD lesion is 30% stenosed.   Mid RCA to Dist RCA lesion is 80% stenosed.   Mid RCA lesion is 30% stenosed.   A drug-eluting stent was successfully placed using a STENT ONYX FRONTIER 3.0X12.   Post intervention, there is a 0% residual stenosis.   The left ventricular systolic function is normal.   LV end diastolic pressure is mildly elevated.   The left ventricular ejection fraction is 55-65% by visual estimate.   1.  Significant one-vessel coronary artery disease involving the right coronary artery. 2.  Normal LV systolic function mildly elevated left ventricular end-diastolic pressure. 3.  Successful drug-eluting stent placement to the mid/distal right coronary artery.   Recommendations: Dual antiplatelet therapy for at least 6 months. Aggressive treatment of risk factors.  Fever blister: it started last week with stress, she has been using otc topical medication but not helping much, she has outbreaks throughout the year, we will start her on Valtrex to take prn   Patient Active Problem List   Diagnosis Date Noted   CAD (coronary artery disease) 07/06/2022   Hyperlipidemia LDL goal <70  07/06/2022   Unstable angina 07/05/2022   Stage 3a chronic kidney disease 07/04/2022   Coccyalgia 01/11/2021   Cystocele with prolapse 06/19/2018   Age-related osteoporosis without current pathological fracture 01/08/2018   History of vertebral compression fracture 05/23/2017   Incomplete bladder emptying 05/16/2016   GERD (gastroesophageal reflux disease) 02/14/2016   Insomnia due to anxiety and fear 02/14/2016   Allergy 07/01/2013   Osteoporosis of lumbar spine 05/19/2013   Overweight (BMI 25.0-29.9) 08/28/2011   Peripheral vertigo 08/28/2011   Nonsustained ventricular tachycardia    Hyperlipidemia     Past Surgical History:  Procedure Laterality Date   ABDOMINAL HYSTERECTOMY     BREAST BIOPSY Right    negative 10-12 years ago- core   CESAREAN SECTION     COLONOSCOPY  2012   CORONARY STENT INTERVENTION N/A 07/05/2022   Procedure: CORONARY STENT INTERVENTION;  Surgeon: Iran Ouch, MD;  Location: [facility] INVASIVE CV LAB;  Service: Cardiovascular;  Laterality: N/A;   CYSTOCELE REPAIR N/A 06/19/2018   Procedure: CYSTOSCOPY ANTERIOR REPAIR (CYSTOCELE);  Surgeon: Alfredo Martinez, MD;  Location: [facility] ORS;  Service: Urology;  Laterality: N/A;   LEFT HEART CATH AND CORONARY ANGIOGRAPHY N/A 07/05/2022   Procedure: LEFT HEART CATH AND CORONARY ANGIOGRAPHY;  Surgeon: Iran Ouch, MD;  Location: [facility] INVASIVE CV LAB;  Service: Cardiovascular;  Laterality: N/A;   TONSILLECTOMY      Family History  Problem Relation Age of Onset   Heart  disease Mother        ATRIAL FIB   Cancer Father 78       Lung Ca,  nonsmoker, metastatic at diagnosis  asbestos exposure   Breast cancer Daughter 17       dx at age of 61 and 70    Social History   Tobacco Use   Smoking status: Former    Packs/day: 0.50    Years: 10.00    Additional pack years: 0.00    Total pack years: 5.00    Types: Cigarettes    Quit date: 04/04/1978    Years since quitting: 44.3   Smokeless tobacco: Never   Tobacco  comments:    smoking cessation materials not required  Substance Use Topics   Alcohol use: Not Currently    Comment: wine occasionally     Current Outpatient Medications:    Ascorbic Acid (VITAMIN C) 100 MG tablet, Take 100 mg by mouth daily., Disp: , Rfl:    aspirin EC 81 MG tablet, Take 1 tablet (81 mg total) by mouth daily. Swallow whole., Disp: 100 tablet, Rfl: 1   calcium carbonate (OS-CAL) 600 MG TABS tablet, Take by mouth., Disp: , Rfl:    cholecalciferol (VITAMIN D3) 25 MCG (1000 UT) tablet, Take 1,000 Units by mouth daily., Disp: , Rfl:    clopidogrel (PLAVIX) 75 MG tablet, Take 1 tablet (75 mg total) by mouth daily with breakfast., Disp: 30 tablet, Rfl: 11   denosumab (PROLIA) 60 MG/ML SOSY injection, Inject 1 mL into the skin every 6 (six) months., Disp: , Rfl:    meclizine (ANTIVERT) 12.5 MG tablet, Take 1 tablet (12.5 mg total) by mouth 4 (four) times daily as needed for dizziness., Disp: 30 tablet, Rfl: 0   pantoprazole (PROTONIX) 40 MG tablet, Take 1 tablet (40 mg total) by mouth daily., Disp: 30 tablet, Rfl: 5   polyethylene glycol powder (GLYCOLAX/MIRALAX) 17 GM/SCOOP powder, Take 17 g by mouth once. PRN, Disp: , Rfl:    QUEtiapine (SEROQUEL) 25 MG tablet, Take 1 tablet (25 mg total) by mouth at bedtime., Disp: 90 tablet, Rfl: 1   rosuvastatin (CRESTOR) 20 MG tablet, TAKE 1 TABLET BY MOUTH EVERY EVENING, Disp: 90 tablet, Rfl: 1   vitamin B-12 (CYANOCOBALAMIN) 500 MCG tablet, Take 500 mcg by mouth daily., Disp: , Rfl:   Allergies  Allergen Reactions   Betadine [Povidone Iodine] Other (See Comments)    Burns   Iodine     I personally reviewed active problem list, medication list, allergies, family history, social history, health maintenance with the patient/caregiver today.   ROS  Ten systems reviewed and is negative except as mentioned in HPI   Objective  Vitals:   07/18/22 1119  BP: 120/70  Pulse: 84  Resp: 16  SpO2: 95%  Weight: 184 lb (83.5 kg)  Height:  5\' 7"  (1.702 m)    Body mass index is 28.82 kg/m.  Physical Exam  Constitutional: Patient appears well-developed and well-nourished.  No distress.  HEENT: head atraumatic, normocephalic, pupils equal and reactive to light, neck supple, left upper lip fever blister Cardiovascular: Normal rate, regular rhythm and normal heart sounds.  No murmur heard. No BLE edema. Pulmonary/Chest: Effort normal and breath sounds normal. No respiratory distress. Abdominal: Soft.  There is no tenderness. Psychiatric: Patient has a normal mood and affect. behavior is normal. Judgment and thought content normal.    PHQ2/9:    07/18/2022   11:12 AM 07/04/2022    9:16 AM 01/04/2022  8:17 AM 12/07/2021    3:26 PM 09/08/2021   10:42 AM  Depression screen PHQ 2/9  Decreased Interest 0 0 0 0 0  Down, Depressed, Hopeless 0 0 0 0 0  PHQ - 2 Score 0 0 0 0 0  Altered sleeping 0 0 0 0   Tired, decreased energy 0 0 0 0   Change in appetite 0 0 0 0   Feeling bad or failure about yourself  0 0 0 0   Trouble concentrating 0 0 0 0   Moving slowly or fidgety/restless 0 0 0 0   Suicidal thoughts 0 0 0 0   PHQ-9 Score 0 0 0 0   Difficult doing work/chores    Not difficult at all     phq 9 is negative   Fall Risk:    07/18/2022   11:11 AM 07/04/2022    9:16 AM 01/04/2022    8:17 AM 12/07/2021    3:26 PM 09/08/2021   10:42 AM  Fall Risk   Falls in the past year? 0 0 0 0 0  Number falls in past yr: 0 0 0 0 0  Injury with Fall? 0 0 0 0 0  Risk for fall due to : No Fall Risks No Fall Risks No Fall Risks  No Fall Risks  Follow up Falls prevention discussed Falls prevention discussed Falls prevention discussed  Falls prevention discussed      Functional Status Survey: Is the patient deaf or have difficulty hearing?: No Does the patient have difficulty seeing, even when wearing glasses/contacts?: No Does the patient have difficulty concentrating, remembering, or making decisions?: No Does the patient have difficulty  walking or climbing stairs?: No Does the patient have difficulty dressing or bathing?: No Does the patient have difficulty doing errands alone such as visiting a doctor's office or shopping?: No    Assessment & Plan  1. Hospital discharge follow-up  Reviewed notes, medication reconciliation done  2. Coronary artery disease due to lipid rich plaque   3. S/P angioplasty with stent  Taking medicaton  4. Senile purpura  Reassurance given   5. Fever blister  - valACYclovir (VALTREX) 1000 MG tablet; Take 2 tablets (2,000 mg total) by mouth 2 (two) times daily as needed for up to 10 days. Take it for 24 hours prn outbreaks  Dispense: 20 tablet; Refill: 0

## 2022-07-18 ENCOUNTER — Encounter: Payer: Self-pay | Admitting: Family Medicine

## 2022-07-18 ENCOUNTER — Ambulatory Visit (INDEPENDENT_AMBULATORY_CARE_PROVIDER_SITE_OTHER): Payer: PPO | Admitting: Family Medicine

## 2022-07-18 VITALS — BP 120/70 | HR 84 | Resp 16 | Ht 67.0 in | Wt 184.0 lb

## 2022-07-18 DIAGNOSIS — Z09 Encounter for follow-up examination after completed treatment for conditions other than malignant neoplasm: Secondary | ICD-10-CM

## 2022-07-18 DIAGNOSIS — I2510 Atherosclerotic heart disease of native coronary artery without angina pectoris: Secondary | ICD-10-CM | POA: Diagnosis not present

## 2022-07-18 DIAGNOSIS — I2583 Coronary atherosclerosis due to lipid rich plaque: Secondary | ICD-10-CM | POA: Diagnosis not present

## 2022-07-18 DIAGNOSIS — D692 Other nonthrombocytopenic purpura: Secondary | ICD-10-CM | POA: Diagnosis not present

## 2022-07-18 DIAGNOSIS — B001 Herpesviral vesicular dermatitis: Secondary | ICD-10-CM

## 2022-07-18 DIAGNOSIS — Z95820 Peripheral vascular angioplasty status with implants and grafts: Secondary | ICD-10-CM

## 2022-07-18 MED ORDER — VALACYCLOVIR HCL 1 G PO TABS
2000.0000 mg | ORAL_TABLET | Freq: Two times a day (BID) | ORAL | 0 refills | Status: AC | PRN
Start: 2022-07-18 — End: 2022-07-28

## 2022-07-19 ENCOUNTER — Ambulatory Visit: Payer: PPO | Attending: Physician Assistant | Admitting: Physician Assistant

## 2022-07-19 ENCOUNTER — Encounter: Payer: Self-pay | Admitting: Physician Assistant

## 2022-07-19 VITALS — BP 108/66 | HR 74 | Ht 66.0 in | Wt 186.2 lb

## 2022-07-19 DIAGNOSIS — I4729 Other ventricular tachycardia: Secondary | ICD-10-CM

## 2022-07-19 DIAGNOSIS — I2510 Atherosclerotic heart disease of native coronary artery without angina pectoris: Secondary | ICD-10-CM

## 2022-07-19 DIAGNOSIS — E785 Hyperlipidemia, unspecified: Secondary | ICD-10-CM

## 2022-07-19 NOTE — Progress Notes (Signed)
Cardiology Office Note    Date:  07/19/2022   ID:  Jacqueline Kaiser, DOB 24-May-1945, MRN [id number]  PCP:  Alba Cory, MD  Cardiologist:  Sherryl Manges, MD  Electrophysiologist:  None   Chief Complaint: Hospital follow-up  History of Present Illness:   Jacqueline Kaiser is a 77 y.o. female with history of CAD status post PCI to the mid RCA, reported NSVT, liver cyst, and HLD who presents for hospital follow-up.  She was evaluated in our office at the request of her PCP on 07/05/2022 for evaluation of chest discomfort concerning for accelerating angina.  In this setting, she was sent to the ED where she remained hemodynamically stable.  HS-Tn negative x 2.  LHC showed significant one-vessel CAD involving the RCA with 80% mid to distal stenosis, which was treated successfully with PCI/DES.  Remaining Details showed mid LAD 30% stenosis, OM1 30% stenosis, mid RCA 30% stenosis, mildly elevated LVEDP, and an LVEF estimated at 55 to 65%.  She comes in doing well from a cardiac perspective and is without symptoms of angina or cardiac decompensation.  No dyspnea, palpitations, dizziness, presyncope, or syncope.  No falls, hematochezia, or melena.  She notes some improving bruising along the right radial arteriotomy site with some tenderness.  She has noted some bleeding from an oral lesion for which she is now on valacyclovir.  Adherent and tolerating DAPT.  Has remained active at baseline, even post PCI without cardiac limitation.  She does not have any acute cardiac concerns at this time.   Labs independently reviewed: 07/2022 - LP(a) 61.6, Hgb 13.1, PLT 180, potassium 4.0, BUN 16, SCr 0.78, albumin 4.1, AST/ALT normal 01/2022 - TC 165, TG 95, HDL 66, LDL 81 05/2019 - TSH normal  Past Medical History:  Diagnosis Date   Allergy    Ankle fracture 2014   Left   Anxiety    CAD (coronary artery disease)    Female cystocele    Grade 2   Fracture    lower back after sneezing   GERD  (gastroesophageal reflux disease)    History of   History of vertigo    last attack about 1 week ago   Hyperlipidemia    Insomnia    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   Right, .4 x 1.3 cm, noted on [place] ABD 05/2016, left lobe of the liver with the largest measuring 9.9 mm in diameter [place] ABD 06/2010   Low back sprain    Nonsustained ventricular tachycardia    by Holter,  noraml Stress test ECHO perpatient Katrinka Blazing, [place])   Obese    Osteoporosis    Sinusitis    Urinary frequency    Wrist fracture    Left    Past Surgical History:  Procedure Laterality Date   ABDOMINAL HYSTERECTOMY     BREAST BIOPSY Right    negative 10-12 years ago- core   CESAREAN SECTION     COLONOSCOPY  2012   CORONARY STENT INTERVENTION N/A 07/05/2022   Procedure: CORONARY STENT INTERVENTION;  Surgeon: Iran Ouch, MD;  Location: [facility];  Service: Cardiovascular;  Laterality: N/A;   CYSTOCELE REPAIR N/A 06/19/2018   Procedure: CYSTOSCOPY ANTERIOR REPAIR (CYSTOCELE);  Surgeon: Alfredo Martinez, MD;  Location: [facility];  Service: Urology;  Laterality: N/A;   LEFT HEART CATH AND CORONARY ANGIOGRAPHY N/A 07/05/2022   Procedure: LEFT HEART CATH AND CORONARY ANGIOGRAPHY;  Surgeon: Iran Ouch, MD;  Location: [facility];  Service: Cardiovascular;  Laterality: N/A;   TONSILLECTOMY      Current Medications: Current Meds  Medication Sig   Ascorbic Acid (VITAMIN C) 100 MG tablet Take 100 mg by mouth daily.   aspirin EC 81 MG tablet Take 1 tablet (81 mg total) by mouth daily. Swallow whole.   calcium carbonate (OS-CAL) 600 MG TABS tablet Take by mouth.   cholecalciferol (VITAMIN D3) 25 MCG (1000 UT) tablet Take 1,000 Units by mouth daily.   clopidogrel (PLAVIX) 75 MG tablet Take 1 tablet (75 mg total) by mouth daily with breakfast.   denosumab (PROLIA) 60 MG/ML SOSY injection Inject 1 mL into the skin every 6 (six) months.   meclizine (ANTIVERT) 12.5 MG tablet Take 1 tablet (12.5 mg total) by mouth 4  (four) times daily as needed for dizziness.   pantoprazole (PROTONIX) 40 MG tablet Take 1 tablet (40 mg total) by mouth daily.   polyethylene glycol powder (GLYCOLAX/MIRALAX) 17 GM/SCOOP powder Take 17 g by mouth once. PRN   QUEtiapine (SEROQUEL) 25 MG tablet Take 1 tablet (25 mg total) by mouth at bedtime.   rosuvastatin (CRESTOR) 20 MG tablet TAKE 1 TABLET BY MOUTH EVERY EVENING   valACYclovir (VALTREX) 1000 MG tablet Take 2 tablets (2,000 mg total) by mouth 2 (two) times daily as needed for up to 10 days. Take it for 24 hours prn outbreaks   vitamin B-12 (CYANOCOBALAMIN) 500 MCG tablet Take 500 mcg by mouth daily.    Allergies:   Betadine [povidone iodine] and Iodine   Social History   Socioeconomic History   Marital status: Married    Spouse name: Tim   Number of children: 2   Years of education: Not on file   Highest education level: 12th grade  Occupational History   Occupation: Retired  Tobacco Use   Smoking status: Former    Packs/day: 0.50    Years: 10.00    Additional pack years: 0.00    Total pack years: 5.00    Types: Cigarettes    Quit date: 04/04/1978    Years since quitting: 44.3   Smokeless tobacco: Never   Tobacco comments:    smoking cessation materials not required  Vaping Use   Vaping Use: Never used  Substance and Sexual Activity   Alcohol use: Not Currently    Comment: wine occasionally   Drug use: No   Sexual activity: Not Currently    Birth control/protection: Surgical  Other Topics Concern   Not on file  Social History Narrative   Works part time at [employer] Strain: Low Risk  (07/03/2022)   Overall Financial Resource Strain (CARDIA)    Difficulty of Paying Living Expenses: Not hard at all  Food Insecurity: No Food Insecurity (07/05/2022)   Hunger Vital Sign    Worried About Running Out of Food in the Last Year: Never true    Ran Out of Food in the Last Year: Never true  Transportation Needs:  No Transportation Needs (07/05/2022)   PRAPARE - Administrator, Civil Service (Medical): No    Lack of Transportation (Non-Medical): No  Physical Activity: Insufficiently Active (07/03/2022)   Exercise Vital Sign    Days of Exercise per Week: 4 days    Minutes of Exercise per Session: 20 min  Stress: No Stress Concern Present (07/03/2022)   [employer] of Occupational Health - Occupational Stress Questionnaire    Feeling of Stress : Not at all  Social  Connections: Unknown (07/03/2022)   Social Connection and Isolation Panel [NHANES]    Frequency of Communication with Friends and Family: More than three times a week    Frequency of Social Gatherings with Friends and Family: Three times a week    Attends Religious Services: Patient declined    Active Member of Clubs or Organizations: No    Attends Engineer, structural: Not on file    Marital Status: Married     Family History:  The patient's family history includes Breast cancer (age of onset: 23) in her daughter; Cancer (age of onset: 40) in her father; Heart disease in her mother.  ROS:   12-point review of systems is negative unless otherwise noted in the HPI.   EKGs/Labs/Other Studies Reviewed:    Studies reviewed were summarized above. The additional studies were reviewed today:  LHC 07/05/2022:   1st Mrg lesion is 30% stenosed.   Mid LAD lesion is 30% stenosed.   Mid RCA to Dist RCA lesion is 80% stenosed.   Mid RCA lesion is 30% stenosed.   A drug-eluting stent was successfully placed using a STENT ONYX FRONTIER 3.0X12.   Post intervention, there is a 0% residual stenosis.   The left ventricular systolic function is normal.   LV end diastolic pressure is mildly elevated.   The left ventricular ejection fraction is 55-65% by visual estimate.   1.  Significant one-vessel coronary artery disease involving the right coronary artery. 2.  Normal LV systolic function mildly elevated left ventricular  end-diastolic pressure. 3.  Successful drug-eluting stent placement to the mid/distal right coronary artery.   Recommendations: Dual antiplatelet therapy for at least 6 months. Aggressive treatment of risk factors.   EKG:  EKG is ordered today.  The EKG ordered today demonstrates NSR, 74 bpm, nonspecific ST-T changes consistent with prior tracing  Recent Labs: 07/05/2022: ALT 11 07/06/2022: BUN 16; Creatinine, Ser 0.78; Hemoglobin 13.1; Platelets 180; Potassium 4.0; Sodium 138  Recent Lipid Panel    Component Value Date/Time   CHOL 165 01/04/2022 0936   TRIG 95 01/04/2022 0936   HDL 66 01/04/2022 0936   CHOLHDL 2.5 01/04/2022 0936   VLDL 27.8 02/11/2016 1152   LDLCALC 81 01/04/2022 0936   LDLDIRECT 109.0 12/15/2014 1644    PHYSICAL EXAM:    VS:  BP 108/66 (BP Location: Left Arm, Patient Position: Sitting, Cuff Size: Normal)   Pulse 74   Ht 5\' 6"  (1.676 m)   Wt 186 lb 3.2 oz (84.5 kg)   SpO2 96%   BMI 30.05 kg/m   BMI: Body mass index is 30.05 kg/m.  Physical Exam Vitals reviewed.  Constitutional:      Appearance: She is well-developed.  HENT:     Head: Normocephalic and atraumatic.  Eyes:     General:        Right eye: No discharge.        Left eye: No discharge.  Neck:     Vascular: No JVD.  Cardiovascular:     Rate and Rhythm: Normal rate and regular rhythm.     Heart sounds: Normal heart sounds, S1 normal and S2 normal. Heart sounds not distant. No midsystolic click and no opening snap. No murmur heard.    No friction rub.     Comments: Resolving soft bruising along the superior and medial aspect of the right radial arteriotomy site.  No active bleeding.  No erythema or swelling.  No bruit.  Radial pulse 2+ proximal and distal  to the arteriotomy site. Pulmonary:     Effort: Pulmonary effort is normal. No respiratory distress.     Breath sounds: Normal breath sounds. No decreased breath sounds, wheezing or rales.  Chest:     Chest wall: No tenderness.   Abdominal:     General: There is no distension.  Musculoskeletal:     Cervical back: Normal range of motion.  Skin:    General: Skin is warm and dry.     Nails: There is no clubbing.  Neurological:     Mental Status: She is alert and oriented to person, place, and time.  Psychiatric:        Speech: Speech normal.        Behavior: Behavior normal.        Thought Content: Thought content normal.        Judgment: Judgment normal.     Wt Readings from Last 3 Encounters:  07/19/22 186 lb 3.2 oz (84.5 kg)  07/18/22 184 lb (83.5 kg)  07/05/22 185 lb 1.6 oz (84 kg)     ASSESSMENT & PLAN:   CAD involving native coronary arteries without angina: She is doing very well, without symptoms concerning for angina or cardiac decompensation.  Continue DAPT with aspirin and clopidogrel without interruption for a minimum of 6 months from date of PCI.  Continue aggressive risk factor modification and secondary prevention.  HLD: LDL 81 in 01/2022 with goal being less than 70.  On rosuvastatin 20 mg.  Look to check fasting lipid panel and LFT in follow-up.  If LDL remains above goal at that time would look to titrate rosuvastatin.  History of NSVT: Prior monitor from outside office unavailable for review.  No evidence of significant ventricular ectopy noted during recent admission.  Not on a beta-blocker secondary to sinus bradycardia.   Disposition: F/u with Dr. Kirke Corin (performed [facility]) or an APP in 6 months.   Medication Adjustments/Labs and Tests Ordered: Current medicines are reviewed at length with the patient today.  Concerns regarding medicines are outlined above. Medication changes, Labs and Tests ordered today are summarized above and listed in the Patient Instructions accessible in Encounters.   Signed, Eula Listen, PA-C 07/19/2022 2:33 PM     [facility] [place] [address] [place], [place] [address] [phone]

## 2022-07-19 NOTE — Patient Instructions (Signed)
Medication Instructions:  No changes at this time.   *If you need a refill on your cardiac medications before your next appointment, please call your pharmacy*   Lab Work: None  If you have labs (blood work) drawn today and your tests are completely normal, you will receive your results only by: MyChart Message (if you have MyChart) OR A paper copy in the mail If you have any lab test that is abnormal or we need to change your treatment, we will call you to review the results.   Testing/Procedures: None   Follow-Up: At [facility], you and your health needs are our priority.  As part of our continuing mission to provide you with exceptional heart care, we have created designated Provider Care Teams.  These Care Teams include your primary Cardiologist (physician) and Advanced Practice Providers (APPs -  Physician Assistants and Nurse Practitioners) who all work together to provide you with the care you need, when you need it.   Your next appointment:   6 month(s)  Provider:   Sherryl Manges, MD or Eula Listen, PA-C

## 2022-07-25 ENCOUNTER — Ambulatory Visit: Payer: PPO | Admitting: Physician Assistant

## 2022-07-26 ENCOUNTER — Ambulatory Visit (INDEPENDENT_AMBULATORY_CARE_PROVIDER_SITE_OTHER): Payer: PPO | Admitting: Podiatry

## 2022-07-26 DIAGNOSIS — Z9889 Other specified postprocedural states: Secondary | ICD-10-CM

## 2022-07-26 NOTE — Progress Notes (Signed)
Chief Complaint  Patient presents with   Routine Post Op    POV #4 DOS 05/12/2022 EPF RT, GASTROC APONCUROSIS LENGTHENING RT, patient is having numbness on the top of the foot, patient states is feels like the foot is going to give out on her,     Subjective:  Patient presents today status post gastroc aponeurosis lengthening with endoscopic plantar fasciotomy of the right lower extremity.  DOS: 05/12/2022.  Patient has no heel pain but she does experience some pain and tenderness still to the dorsum of the foot over the past few weeks.  She is wearing tennis shoes.  No new complaints  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   Right, .4 x 1.3 cm, noted on [place] ABD 05/2016, left lobe of the liver with the largest measuring 9.9 mm in diameter [place] ABD 06/2010   Low back sprain    Nonsustained ventricular tachycardia    by Holter,  noraml Stress test ECHO perpatient Katrinka Blazing, [place])   Obese    Osteoporosis    Sinusitis    Urinary frequency    Wrist fracture    Left    Past Surgical History:  Procedure Laterality Date   ABDOMINAL HYSTERECTOMY     BREAST BIOPSY Right    negative 10-12 years ago- core   CESAREAN SECTION     COLONOSCOPY  2012   CORONARY STENT INTERVENTION N/A 07/05/2022   Procedure: CORONARY STENT INTERVENTION;  Surgeon: Iran Ouch, MD;  Location: [facility];  Service: Cardiovascular;  Laterality: N/A;   CYSTOCELE REPAIR N/A 06/19/2018   Procedure: CYSTOSCOPY ANTERIOR REPAIR (CYSTOCELE);  Surgeon: Alfredo Martinez, MD;  Location: [facility];  Service: Urology;  Laterality: N/A;   LEFT HEART CATH AND CORONARY ANGIOGRAPHY N/A 07/05/2022   Procedure:  LEFT HEART CATH AND CORONARY ANGIOGRAPHY;  Surgeon: Iran Ouch, MD;  Location: [facility];  Service: Cardiovascular;  Laterality: N/A;   TONSILLECTOMY      Allergies  Allergen Reactions   Betadine [Povidone Iodine] Other (See Comments)    Burns   Iodine     Objective/Physical Exam Neurovascular status intact.  Incisions healed.  There is some tenderness throughout the dorsum of the foot but there is no heel pain or calf pain.  No edema noted.  Assessment: 1. s/p endoscopic plantar fasciotomy with gastroc aponeurosis lengthening right. DOS: 05/12/2022 -Patient evaluated - Continue wearing good supportive tennis shoes and sneakers.  I am hoping that the pain throughout the dorsum of the foot slowly resolves over the next 6 weeks -Continue compression ankle sleeve -Return to clinic 6 weeks  Felecia Shelling, DPM [facility]  Dr. Felecia Shelling, DPM    [address] [place], [place] [address]  Office [phone]  Fax [phone]

## 2022-08-02 ENCOUNTER — Telehealth: Payer: Self-pay | Admitting: Family Medicine

## 2022-08-02 NOTE — Telephone Encounter (Signed)
Contacted Jacqueline Kaiser How to schedule their annual wellness visit. Appointment made for 08/26/2022.  [facility] Care Guide [facility] AW[facility] TEAM Direct Dial: [phone]

## 2022-08-03 ENCOUNTER — Telehealth: Payer: Self-pay | Admitting: *Deleted

## 2022-08-03 NOTE — Telephone Encounter (Signed)
Patient is calling because her ankle feels like it is going out,foot is" killing me"she is trying to wear the sleeve but even extra large hurts putting on,is a different pain than before(top of foot and outside. Can she go back in the boot , is on blood thinner and only taking extra strength tylenol which helps a little.Please advise.

## 2022-08-04 NOTE — Telephone Encounter (Signed)
Pt called back today and her foot is swollen and very painful. She feels like it is going to give out. She is not sure if she should put the boot back on or not..   I did schedule her to see you in [place] on Monday 5.6

## 2022-08-05 ENCOUNTER — Encounter: Payer: Self-pay | Admitting: Family Medicine

## 2022-08-05 ENCOUNTER — Ambulatory Visit (INDEPENDENT_AMBULATORY_CARE_PROVIDER_SITE_OTHER): Payer: PPO | Admitting: Family Medicine

## 2022-08-05 VITALS — BP 122/74 | HR 73 | Temp 98.5°F | Resp 16 | Ht 66.0 in | Wt 186.0 lb

## 2022-08-05 DIAGNOSIS — R635 Abnormal weight gain: Secondary | ICD-10-CM | POA: Diagnosis not present

## 2022-08-05 DIAGNOSIS — R600 Localized edema: Secondary | ICD-10-CM | POA: Diagnosis not present

## 2022-08-05 NOTE — Progress Notes (Signed)
Patient ID: Jacqueline Kaiser, female    DOB: 1946-01-15, 77 y.o.   MRN: [id number]  PCP: Alba Cory, MD  Chief Complaint  Patient presents with   Edema    All over unsure what is causing what ongoing for a while.    Subjective:   Jacqueline Kaiser is a 77 y.o. female, presents to clinic with CC of the following:  HPI  Pt presents for "retaining fluid" Recently had surgery and in boot with pain this week - she states that has kept her less mobile the last couple days, but no other recent changes to diet, no new meds. She reports swelling/fluid everywhere - arms legs and a few lbs weight increase (reports 4 but the scale at home matches here today and last recorded weights are also stable) PCI in the last month started plavix asa - she has bruising all over from this She denies orthopnea, PND, dyspnea on exertion, palpitations, chest pain She is taking Tylenol for her recent surgery but only taking 2 tablets twice a day She has no known liver issues, no kidney issues she is urinating like normal  Wt Readings from Last 5 Encounters:  08/05/22 186 lb (84.4 kg)  07/19/22 186 lb 3.2 oz (84.5 kg)  07/18/22 184 lb (83.5 kg)  07/05/22 185 lb 1.6 oz (84 kg)  07/05/22 185 lb 9.6 oz (84.2 kg)   BMI Readings from Last 5 Encounters:  08/05/22 30.02 kg/m  07/19/22 30.05 kg/m  07/18/22 28.82 kg/m  07/05/22 29.88 kg/m  07/05/22 29.07 kg/m      Patient Active Problem List   Diagnosis Date Noted   CAD (coronary artery disease) 07/06/2022   Hyperlipidemia LDL goal <70 07/06/2022   Unstable angina (HCC) 07/05/2022   Stage 3a chronic kidney disease (HCC) 07/04/2022   Coccyalgia 01/11/2021   Cystocele with prolapse 06/19/2018   Age-related osteoporosis without current pathological fracture 01/08/2018   History of vertebral compression fracture 05/23/2017   Incomplete bladder emptying 05/16/2016   GERD (gastroesophageal reflux disease) 02/14/2016   Insomnia due to anxiety and  fear 02/14/2016   Allergy 07/01/2013   Osteoporosis of lumbar spine 05/19/2013   Overweight (BMI 25.0-29.9) 08/28/2011   Peripheral vertigo 08/28/2011   Nonsustained ventricular tachycardia (HCC)    Hyperlipidemia       Current Outpatient Medications:    Ascorbic Acid (VITAMIN C) 100 MG tablet, Take 100 mg by mouth daily., Disp: , Rfl:    aspirin EC 81 MG tablet, Take 1 tablet (81 mg total) by mouth daily. Swallow whole., Disp: 100 tablet, Rfl: 1   calcium carbonate (OS-CAL) 600 MG TABS tablet, Take by mouth., Disp: , Rfl:    cholecalciferol (VITAMIN D3) 25 MCG (1000 UT) tablet, Take 1,000 Units by mouth daily., Disp: , Rfl:    clopidogrel (PLAVIX) 75 MG tablet, Take 1 tablet (75 mg total) by mouth daily with breakfast., Disp: 30 tablet, Rfl: 11   denosumab (PROLIA) 60 MG/ML SOSY injection, Inject 1 mL into the skin every 6 (six) months., Disp: , Rfl:    meclizine (ANTIVERT) 12.5 MG tablet, Take 1 tablet (12.5 mg total) by mouth 4 (four) times daily as needed for dizziness., Disp: 30 tablet, Rfl: 0   pantoprazole (PROTONIX) 40 MG tablet, Take 1 tablet (40 mg total) by mouth daily., Disp: 30 tablet, Rfl: 5   polyethylene glycol powder (GLYCOLAX/MIRALAX) 17 GM/SCOOP powder, Take 17 g by mouth once. PRN, Disp: , Rfl:    QUEtiapine (SEROQUEL) 25  MG tablet, Take 1 tablet (25 mg total) by mouth at bedtime., Disp: 90 tablet, Rfl: 1   rosuvastatin (CRESTOR) 20 MG tablet, TAKE 1 TABLET BY MOUTH EVERY EVENING, Disp: 90 tablet, Rfl: 1   vitamin B-12 (CYANOCOBALAMIN) 500 MCG tablet, Take 500 mcg by mouth daily., Disp: , Rfl:    Allergies  Allergen Reactions   Betadine [Povidone Iodine] Other (See Comments)    Burns   Iodine      Social History   Tobacco Use   Smoking status: Former    Packs/day: 0.50    Years: 10.00    Additional pack years: 0.00    Total pack years: 5.00    Types: Cigarettes    Quit date: 04/04/1978    Years since quitting: 44.3   Smokeless tobacco: Never   Tobacco  comments:    smoking cessation materials not required  Vaping Use   Vaping Use: Never used  Substance Use Topics   Alcohol use: Not Currently    Comment: wine occasionally   Drug use: No      Chart Review Today: I personally reviewed active problem list, medication list, allergies, family history, social history, health maintenance, notes from last encounter, lab results, imaging with the patient/caregiver today.   Review of Systems  Constitutional: Negative.   HENT: Negative.    Eyes: Negative.   Respiratory: Negative.    Cardiovascular: Negative.   Gastrointestinal: Negative.   Endocrine: Negative.   Genitourinary: Negative.   Musculoskeletal: Negative.   Skin: Negative.   Allergic/Immunologic: Negative.   Neurological: Negative.   Hematological: Negative.   Psychiatric/Behavioral: Negative.    All other systems reviewed and are negative.      Objective:   Vitals:   08/05/22 1436  BP: 122/74  Pulse: 73  Resp: 16  Temp: 98.5 F (36.9 C)  TempSrc: Oral  SpO2: 96%  Weight: 186 lb (84.4 kg)  Height: 5\' 6"  (1.676 m)    Body mass index is 30.02 kg/m.  Physical Exam Vitals and nursing note reviewed.  Constitutional:      General: She is not in acute distress.    Appearance: Normal appearance. She is well-developed, well-groomed and overweight. She is not ill-appearing, toxic-appearing or diaphoretic.     Interventions: Face mask in place.  HENT:     Head: Normocephalic and atraumatic.     Nose: Nose normal.  Eyes:     General:        Right eye: No discharge.        Left eye: No discharge.     Conjunctiva/sclera: Conjunctivae normal.  Neck:     Trachea: No tracheal deviation.  Cardiovascular:     Rate and Rhythm: Normal rate and regular rhythm. No extrasystoles are present.    Pulses: Normal pulses.          Radial pulses are 2+ on the right side and 2+ on the left side.     Heart sounds: Normal heart sounds. No murmur heard.    No friction rub. No  gallop.     Comments: 2+ pitting Pedal and ankle edema Pretibial edema more mild w/o pitting from mid shin up No other pitting edema noted to extremities  Pulmonary:     Effort: Pulmonary effort is normal. No respiratory distress.     Breath sounds: Normal breath sounds. No stridor. No wheezing, rhonchi or rales.  Chest:     Chest wall: No tenderness.  Skin:    General: Skin is warm  and dry.     Findings: Bruising present. No rash.  Neurological:     Mental Status: She is alert.     Motor: No abnormal muscle tone.     Coordination: Coordination normal.     Gait: Gait abnormal (right foot/ankle in boot).  Psychiatric:        Behavior: Behavior normal. Behavior is cooperative.      Results for orders placed or performed during the hospital encounter of 07/05/22  Basic metabolic panel  Result Value Ref Range   Sodium 139 135 - 145 mmol/L   Potassium 4.2 3.5 - 5.1 mmol/L   Chloride 104 98 - 111 mmol/L   CO2 27 22 - 32 mmol/L   Glucose, Bld 108 (H) 70 - 99 mg/dL   BUN 14 8 - 23 mg/dL   Creatinine, Ser 4.09 0.44 - 1.00 mg/dL   Calcium 8.9 8.9 - 81.1 mg/dL   GFR, Estimated >91 >47 mL/min   Anion gap 8 5 - 15  CBC  Result Value Ref Range   WBC 3.9 (L) 4.0 - 10.5 K/uL   RBC 4.39 3.87 - 5.11 MIL/uL   Hemoglobin 13.2 12.0 - 15.0 g/dL   HCT 82.9 56.2 - 13.0 %   MCV 92.3 80.0 - 100.0 fL   MCH 30.1 26.0 - 34.0 pg   MCHC 32.6 30.0 - 36.0 g/dL   RDW 86.5 78.4 - 69.6 %   Platelets 171 150 - 400 K/uL   nRBC 0.0 0.0 - 0.2 %  Lipase, blood  Result Value Ref Range   Lipase 30 11 - 51 U/L  Hepatic function panel  Result Value Ref Range   Total Protein 6.6 6.5 - 8.1 g/dL   Albumin 4.1 3.5 - 5.0 g/dL   AST 20 15 - 41 U/L   ALT 11 0 - 44 U/L   Alkaline Phosphatase 48 38 - 126 U/L   Total Bilirubin 0.6 0.3 - 1.2 mg/dL   Bilirubin, Direct <2.9 0.0 - 0.2 mg/dL   Indirect Bilirubin NOT CALCULATED 0.3 - 0.9 mg/dL  Basic metabolic panel  Result Value Ref Range   Sodium 138 135 - 145  mmol/L   Potassium 4.0 3.5 - 5.1 mmol/L   Chloride 108 98 - 111 mmol/L   CO2 23 22 - 32 mmol/L   Glucose, Bld 169 (H) 70 - 99 mg/dL   BUN 16 8 - 23 mg/dL   Creatinine, Ser 5.28 0.44 - 1.00 mg/dL   Calcium 8.7 (L) 8.9 - 10.3 mg/dL   GFR, Estimated >41 >32 mL/min   Anion gap 7 5 - 15  CBC  Result Value Ref Range   WBC 3.7 (L) 4.0 - 10.5 K/uL   RBC 4.36 3.87 - 5.11 MIL/uL   Hemoglobin 13.1 12.0 - 15.0 g/dL   HCT 44.0 10.2 - 72.5 %   MCV 89.0 80.0 - 100.0 fL   MCH 30.0 26.0 - 34.0 pg   MCHC 33.8 30.0 - 36.0 g/dL   RDW 36.6 44.0 - 34.7 %   Platelets 180 150 - 400 K/uL   nRBC 0.0 0.0 - 0.2 %  Lipoprotein A (LPA)  Result Value Ref Range   Lipoprotein (a) 61.6 (H) <75.0 nmol/L  POCT Activated clotting time  Result Value Ref Range   Activated Clotting Time 488 seconds  POCT Activated clotting time  Result Value Ref Range   Activated Clotting Time 271 seconds  Troponin I (High Sensitivity)  Result Value Ref Range   Troponin  I (High Sensitivity) 4 <18 ng/L  Troponin I (High Sensitivity)  Result Value Ref Range   Troponin I (High Sensitivity) 5 <18 ng/L       Assessment & Plan:   1. Peripheral edema Some LE edema -patient reports fluid retention everywhere that is noticeable to her and friends and coworkers including her face arms hands and lower extremities No recent changes to diet (eats low salt and no recent increase in carbs), no recent steroids Only recent med change has been Plavix after PCI about a month ago she has been on Plavix and aspirin, we looked up medications and I did not see any side effects of peripheral edema or third spacing She is not having any concerning cardiac symptoms I doubt any CHF but BNP was obtained today her lungs were clear and cardiac exam unremarkable Doubt any kidney or liver issues but we will check a chemistry Most likely she is having some fluid retention with recent flareup of foot and ankle pain causing her to be less active than normal  for the last couple days including wearing a boot Encouraged her to ensure she is doing low-salt diet at home, she is interested in a diuretic if labs are normal and edema/"fluid retention" does not improve on its own. - Brain natriuretic peptide - COMPLETE METABOLIC PANEL WITH GFR  2. Weight gain Reported with edema x 2-3 d No weight changes per chart She also reports weighing 186 this morning without clothes on and she is 186 here in clinic Her weight has been fairly stable when I reviewed several vital signs over the last several months - Brain natriuretic peptide - COMPLETE METABOLIC PANEL WITH GFR      Danelle Berry, PA-C 08/05/22 3:02 PM

## 2022-08-05 NOTE — Patient Instructions (Signed)
Make sure you're doing low salt (it sounds like you are) and once I check out your labs I can call you and send in a few doses of a diuretic if you're still holding a lot of fluid.  Edema  Edema is when you have too much fluid in your body or under your skin. Edema may make your legs, feet, and ankles swell. Swelling often happens in looser tissues, such as around your eyes. This is a common condition. It gets more common as you get older. There are many possible causes of edema. These include: Eating too much salt (sodium). Being on your feet or sitting for a long time. Certain medical conditions, such as: Pregnancy. Heart failure. Liver disease. Kidney disease. Cancer. Hot weather may make edema worse. Edema is usually painless. Your skin may look swollen or shiny. Follow these instructions at home: Medicines Take over-the-counter and prescription medicines only as told by your doctor. Your doctor may prescribe a medicine to help your body get rid of extra water (diuretic). Take this medicine if you are told to take it. Eating and drinking Eat a low-salt (low-sodium) diet as told by your doctor. Sometimes, eating less salt may reduce swelling. Depending on the cause of your swelling, you may need to limit how much fluid you drink (fluid restriction). General instructions Raise the injured area above the level of your heart while you are sitting or lying down. Do not sit still or stand for a long time. Do not wear tight clothes. Do not wear garters on your upper legs. Exercise your legs. This can help the swelling go down. Wear compression stockings as told by your doctor. It is important that these are the right size. These should be prescribed by your doctor to prevent possible injuries. If elastic bandages or wraps are recommended, use them as told by your doctor. Contact a doctor if: Treatment is not working. You have heart, liver, or kidney disease and have symptoms of  edema. You have sudden and unexplained weight gain. Get help right away if: You have shortness of breath or chest pain. You cannot breathe when you lie down. You have pain, redness, or warmth in the swollen areas. You have heart, liver, or kidney disease and get edema all of a sudden. You have a fever and your symptoms get worse all of a sudden. These symptoms may be an emergency. Get help right away. Call 911. Do not wait to see if the symptoms will go away. Do not drive yourself to the hospital. Summary Edema is when you have too much fluid in your body or under your skin. Edema may make your legs, feet, and ankles swell. Swelling often happens in looser tissues, such as around your eyes. Raise the injured area above the level of your heart while you are sitting or lying down. Follow your doctor's instructions about diet and how much fluid you can drink. This information is not intended to replace advice given to you by your health care provider. Make sure you discuss any questions you have with your health care provider. Document Revised: 11/23/2020 Document Reviewed: 11/23/2020 Elsevier Patient Education  2023 [employer]vier Inc[employer]   Low-Sodium Eating Plan Sodium, which is an element that makes up salt, helps you maintain a healthy balance of fluids in your body. Too much sodium can increase your blood pressure and cause fluid and waste to be held in your body. Your health care provider or dietitian may recommend following this plan if you  have high blood pressure (hypertension), kidney disease, liver disease, or heart failure. Eating less sodium can help lower your blood pressure, reduce swelling, and protect your heart, liver, and kidneys. What are tips for following this plan? Reading food labels The Nutrition Facts label lists the amount of sodium in one serving of the food. If you eat more than one serving, you must multiply the listed amount of sodium by the number of servings. Choose  foods with less than 140 mg of sodium per serving. Avoid foods with 300 mg of sodium or more per serving. Shopping  Look for lower-sodium products, often labeled as "low-sodium" or "no salt added." Always check the sodium content, even if foods are labeled as "unsalted" or "no salt added." Buy fresh foods. Avoid canned foods and pre-made or frozen meals. Avoid canned, cured, or processed meats. Buy breads that have less than 80 mg of sodium per slice. Cooking  Eat more home-cooked food and less restaurant, buffet, and fast food. Avoid adding salt when cooking. Use salt-free seasonings or herbs instead of table salt or sea salt. Check with your health care provider or pharmacist before using salt substitutes. Cook with plant-based oils, such as canola, sunflower, or olive oil. Meal planning When eating at a restaurant, ask that your food be prepared with less salt or no salt, if possible. Avoid dishes labeled as brined, pickled, cured, smoked, or made with soy sauce, miso, or teriyaki sauce. Avoid foods that contain MSG (monosodium glutamate). MSG is sometimes added to [place] food, bouillon, and some canned foods. Make meals that can be grilled, baked, poached, roasted, or steamed. These are generally made with less sodium. General information Most people on this plan should limit their sodium intake to 1,500-2,000 mg (milligrams) of sodium each day. What foods should I eat? Fruits Fresh, frozen, or canned fruit. Fruit juice. Vegetables Fresh or frozen vegetables. "No salt added" canned vegetables. "No salt added" tomato sauce and paste. Low-sodium or reduced-sodium tomato and vegetable juice. Grains Low-sodium cereals, including oats, puffed wheat and rice, and shredded wheat. Low-sodium crackers. Unsalted rice. Unsalted pasta. Low-sodium bread. Whole-grain breads and whole-grain pasta. Meats and other proteins Fresh or frozen (no salt added) meat, poultry, seafood, and fish. Low-sodium  canned tuna and salmon. Unsalted nuts. Dried peas, beans, and lentils without added salt. Unsalted canned beans. Eggs. Unsalted nut butters. Dairy Milk. Soy milk. Cheese that is naturally low in sodium, such as ricotta cheese, fresh mozzarella, or Swiss cheese. Low-sodium or reduced-sodium cheese. Cream cheese. Yogurt. Seasonings and condiments Fresh and dried herbs and spices. Salt-free seasonings. Low-sodium mustard and ketchup. Sodium-free salad dressing. Sodium-free light mayonnaise. Fresh or refrigerated horseradish. Lemon juice. Vinegar. Other foods Homemade, reduced-sodium, or low-sodium soups. Unsalted popcorn and pretzels. Low-salt or salt-free chips. The items listed above may not be a complete list of foods and beverages you can eat. Contact a dietitian for more information. What foods should I avoid? Vegetables Sauerkraut, pickled vegetables, and relishes. Olives. [place] fries. Onion rings. Regular canned vegetables (not low-sodium or reduced-sodium). Regular canned tomato sauce and paste (not low-sodium or reduced-sodium). Regular tomato and vegetable juice (not low-sodium or reduced-sodium). Frozen vegetables in sauces. Grains Instant hot cereals. Bread stuffing, pancake, and biscuit mixes. Croutons. Seasoned rice or pasta mixes. Noodle soup cups. Boxed or frozen macaroni and cheese. Regular salted crackers. Self-rising flour. Meats and other proteins Meat or fish that is salted, canned, smoked, spiced, or pickled. Precooked or cured meat, such as sausages or meat loaves. Tomasa Blase.  Ham. Pepperoni. Hot dogs. Corned beef. Chipped beef. Salt pork. Jerky. Pickled herring. Anchovies and sardines. Regular canned tuna. Salted nuts. Dairy Processed cheese and cheese spreads. Hard cheeses. Cheese curds. Blue cheese. Feta cheese. String cheese. Regular cottage cheese. Buttermilk. Canned milk. Fats and oils Salted butter. Regular margarine. Ghee. Bacon fat. Seasonings and condiments Onion salt,  garlic salt, seasoned salt, table salt, and sea salt. Canned and packaged gravies. Worcestershire sauce. Tartar sauce. Barbecue sauce. Teriyaki sauce. Soy sauce, including reduced-sodium. Steak sauce. Fish sauce. Oyster sauce. Cocktail sauce. Horseradish that you find on the shelf. Regular ketchup and mustard. Meat flavorings and tenderizers. Bouillon cubes. Hot sauce. Pre-made or packaged marinades. Pre-made or packaged taco seasonings. Relishes. Regular salad dressings. Salsa. Other foods Salted popcorn and pretzels. Corn chips and puffs. Potato and tortilla chips. Canned or dried soups. Pizza. Frozen entrees and pot pies. The items listed above may not be a complete list of foods and beverages you should avoid. Contact a dietitian for more information. Summary Eating less sodium can help lower your blood pressure, reduce swelling, and protect your heart, liver, and kidneys. Most people on this plan should limit their sodium intake to 1,500-2,000 mg (milligrams) of sodium each day. Canned, boxed, and frozen foods are high in sodium. Restaurant foods, fast foods, and pizza are also very high in sodium. You also get sodium by adding salt to food. Try to cook at home, eat more fresh fruits and vegetables, and eat less fast food and canned, processed, or prepared foods. This information is not intended to replace advice given to you by your health care provider. Make sure you discuss any questions you have with your health care provider. Document Revised: 02/25/2019 Document Reviewed: 02/20/2019 Elsevier Patient Education  2023 [employer].

## 2022-08-06 LAB — COMPLETE METABOLIC PANEL WITH GFR
AG Ratio: 2.2 (calc) (ref 1.0–2.5)
ALT: 13 U/L (ref 6–29)
AST: 23 U/L (ref 10–35)
Albumin: 4.3 g/dL (ref 3.6–5.1)
Alkaline phosphatase (APISO): 47 U/L (ref 37–153)
BUN: 14 mg/dL (ref 7–25)
CO2: 27 mmol/L (ref 20–32)
Calcium: 9.5 mg/dL (ref 8.6–10.4)
Chloride: 105 mmol/L (ref 98–110)
Creat: 0.92 mg/dL (ref 0.60–1.00)
Globulin: 2 g/dL (calc) (ref 1.9–3.7)
Glucose, Bld: 96 mg/dL (ref 65–99)
Potassium: 4.3 mmol/L (ref 3.5–5.3)
Sodium: 140 mmol/L (ref 135–146)
Total Bilirubin: 0.4 mg/dL (ref 0.2–1.2)
Total Protein: 6.3 g/dL (ref 6.1–8.1)
eGFR: 64 mL/min/{1.73_m2} (ref >=60)

## 2022-08-06 LAB — BRAIN NATRIURETIC PEPTIDE: Brain Natriuretic Peptide: 45 pg/mL (ref <100)

## 2022-08-08 ENCOUNTER — Other Ambulatory Visit: Payer: Self-pay | Admitting: Family Medicine

## 2022-08-08 ENCOUNTER — Ambulatory Visit (INDEPENDENT_AMBULATORY_CARE_PROVIDER_SITE_OTHER): Payer: PPO

## 2022-08-08 ENCOUNTER — Ambulatory Visit (INDEPENDENT_AMBULATORY_CARE_PROVIDER_SITE_OTHER): Payer: PPO | Admitting: Podiatry

## 2022-08-08 DIAGNOSIS — M722 Plantar fascial fibromatosis: Secondary | ICD-10-CM

## 2022-08-08 DIAGNOSIS — M79671 Pain in right foot: Secondary | ICD-10-CM

## 2022-08-08 DIAGNOSIS — R600 Localized edema: Secondary | ICD-10-CM

## 2022-08-08 MED ORDER — FUROSEMIDE 20 MG PO TABS
10.0000 mg | ORAL_TABLET | Freq: Once | ORAL | 0 refills | Status: DC | PRN
Start: 2022-08-08 — End: 2022-12-26

## 2022-08-08 MED ORDER — POTASSIUM CHLORIDE CRYS ER 20 MEQ PO TBCR: 10.0000 meq | EXTENDED_RELEASE_TABLET | Freq: Once | ORAL | 0 refills | Status: DC | PRN

## 2022-08-08 NOTE — Progress Notes (Signed)
Chief Complaint  Patient presents with   Routine Post Op    POV #5 DOS 05/12/2022 EPF RT, GASTROC APONCUROSIS LENGTHENING RT, patient is having numbness on the top of the foot, rate of pain 5 out 10, patient states it feels better when she is wearing the Cam boot, Patient denies any N/V/F/C/SOB     Subjective:  Patient presents today status post gastroc aponeurosis lengthening with endoscopic plantar fasciotomy of the right lower extremity.  DOS: 05/12/2022.  Patient states that since last visit she has had increased pain and tenderness to the right foot with some swelling.  Unrelated to the surgical sites.  She has no heel pain or posterior calf pain.  The pain is localized to the dorsal lateral aspect of the right midfoot.  She also feels some unsteadiness with her walking.  She has returned to the cam boot.  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   Right, .4 x 1.3 cm, noted on [place] ABD 05/2016, left lobe of the liver with the largest measuring 9.9 mm in diameter [place] ABD 06/2010   Low back sprain    Nonsustained ventricular tachycardia (HCC)    by Holter,  noraml Stress test ECHO perpatient Katrinka Blazing, [place])   Obese    Osteoporosis    Sinusitis    Urinary frequency    Wrist fracture    Left    Past Surgical History:  Procedure Laterality Date   ABDOMINAL HYSTERECTOMY     BREAST BIOPSY Right    negative 10-12 years ago- core   CESAREAN SECTION     COLONOSCOPY  2012   CORONARY STENT INTERVENTION N/A 07/05/2022   Procedure: CORONARY STENT INTERVENTION;  Surgeon: Iran Ouch, MD;  Location: [facility];  Service: Cardiovascular;  Laterality: N/A;   CYSTOCELE  REPAIR N/A 06/19/2018   Procedure: CYSTOSCOPY ANTERIOR REPAIR (CYSTOCELE);  Surgeon: Alfredo Martinez, MD;  Location: [facility];  Service: Urology;  Laterality: N/A;   LEFT HEART CATH AND CORONARY ANGIOGRAPHY N/A 07/05/2022   Procedure: LEFT HEART CATH AND CORONARY ANGIOGRAPHY;  Surgeon: Iran Ouch, MD;  Location: [facility];  Service: Cardiovascular;  Laterality: N/A;   TONSILLECTOMY      Allergies  Allergen Reactions   Betadine [Povidone Iodine] Other (See Comments)    Burns   Iodine     Objective/Physical Exam Neurovascular status intact.  Incisions healed.  No pain or tenderness associated to the heel or posterior calf.  Muscle strength 5/5 all compartments  Patient does relate pain and tenderness associated to the dorsal lateral area of the midfoot around the TMT joint.  Clinically some concern for possible stress fracture.  She also states that she notices swelling throughout the day with increased pain depending on her activity.  Today there is a moderate edema compared to the contralateral foot.  No calf pain.  Pulses are palpable.  Cap refill immediate.  Radiographic exam RT foot 08/08/2022 Normal osseous mineralization.  Joint spaces preserved.  No indication of fracture.  Impression:  Negative  Assessment: 1. s/p endoscopic plantar fasciotomy with gastroc aponeurosis lengthening right. DOS: 05/12/2022 2.  Right midfoot capsulitis  -Patient evaluated.  X-rays reviewed -Patient wears a cam boot because she states it creates stability in the foot.  She may continue and allow physical therapy to help transition her out of the cam boot - I do believe the patient would benefit from custom molded orthotics to help create some stability of the foot.  Patient opts for prefabricated OTC insoles.  She has worn them in the past and she would like to start with prefabricated insoles versus custom -In the past the patient had declined physical therapy.  Recommend physical therapy  now.  Prescription for physical therapy at [facility] provided for the patient -Continue compression ankle sleeves -Return to clinic 6 weeks  Felecia Shelling, DPM [facility]  Dr. Felecia Shelling, DPM    [address] [place], [place] [address]                Office [phone]  Fax [phone]

## 2022-08-11 DIAGNOSIS — H9313 Tinnitus, bilateral: Secondary | ICD-10-CM | POA: Diagnosis not present

## 2022-08-11 DIAGNOSIS — H903 Sensorineural hearing loss, bilateral: Secondary | ICD-10-CM | POA: Diagnosis not present

## 2022-08-16 ENCOUNTER — Other Ambulatory Visit: Payer: Self-pay | Admitting: Podiatry

## 2022-08-16 DIAGNOSIS — M722 Plantar fascial fibromatosis: Secondary | ICD-10-CM

## 2022-08-16 DIAGNOSIS — M79671 Pain in right foot: Secondary | ICD-10-CM

## 2022-08-26 ENCOUNTER — Ambulatory Visit (INDEPENDENT_AMBULATORY_CARE_PROVIDER_SITE_OTHER): Payer: PPO

## 2022-08-26 DIAGNOSIS — Z0000 Encounter for general adult medical examination without abnormal findings: Secondary | ICD-10-CM

## 2022-08-26 NOTE — Patient Instructions (Signed)

## 2022-08-26 NOTE — Progress Notes (Signed)
I connected with  Jacqueline Kaiser on 08/26/22 by a audio enabled telemedicine application and verified that I am speaking with the correct person using two identifiers.  Patient Location: Home  Provider Location: Home Office  I discussed the limitations of evaluation and management by telemedicine. The patient expressed understanding and agreed to proceed.  Subjective:   Jacqueline Kaiser is a 77 y.o. female who presents for Medicare Annual (Subsequent) preventive examination.  Review of Systems    Per HPI unless specifically indicated below.  Cardiac Risk Factors include: advanced age (>60men, >42 women);female gender, Coronary artery disease, and Hyperlipidemia.           Objective:    Today's Vitals   08/26/22 1510  PainSc: 4    There is no height or weight on file to calculate BMI.     08/26/2022    3:13 PM 07/05/2022    3:34 PM 07/05/2022   10:03 AM 01/12/2021    8:52 AM 01/09/2020    8:41 AM 01/08/2019    8:24 AM 06/19/2018    1:00 PM  Advanced Directives  Does Patient Have a Medical Advance Directive? Yes No No Yes Yes Yes Yes  Type of Estate agent of [place];Living will   Healthcare Power of [place];Living will Healthcare Power of [place];Living will Healthcare Power of [place];Living will Healthcare Power of [place];Living will  Does patient want to make changes to medical advance directive? No - Patient declined      No - Patient declined  Copy of Healthcare Power of Attorney in Chart? No - copy requested   Yes - validated most recent copy scanned in chart (See row information) Yes - validated most recent copy scanned in chart (See row information) Yes - validated most recent copy scanned in chart (See row information) No - copy requested  Would patient like information on creating a medical advance directive?  No - Patient declined No - Patient declined        Current Medications (verified) Outpatient Encounter Medications as of  08/26/2022  Medication Sig   Ascorbic Acid (VITAMIN C) 100 MG tablet Take 100 mg by mouth daily.   aspirin EC 81 MG tablet Take 1 tablet (81 mg total) by mouth daily. Swallow whole.   calcium carbonate (OS-CAL) 600 MG TABS tablet Take by mouth.   cholecalciferol (VITAMIN D3) 25 MCG (1000 UT) tablet Take 1,000 Units by mouth daily.   clopidogrel (PLAVIX) 75 MG tablet Take 1 tablet (75 mg total) by mouth daily with breakfast.   denosumab (PROLIA) 60 MG/ML SOSY injection Inject 1 mL into the skin every 6 (six) months.   meclizine (ANTIVERT) 12.5 MG tablet Take 1 tablet (12.5 mg total) by mouth 4 (four) times daily as needed for dizziness.   pantoprazole (PROTONIX) 40 MG tablet Take 1 tablet (40 mg total) by mouth daily.   polyethylene glycol powder (GLYCOLAX/MIRALAX) 17 GM/SCOOP powder Take 17 g by mouth once. PRN   QUEtiapine (SEROQUEL) 25 MG tablet Take 1 tablet (25 mg total) by mouth at bedtime.   rosuvastatin (CRESTOR) 20 MG tablet TAKE 1 TABLET BY MOUTH EVERY EVENING   vitamin B-12 (CYANOCOBALAMIN) 500 MCG tablet Take 500 mcg by mouth daily.   furosemide (LASIX) 20 MG tablet Take 0.5-1 tablets (10-20 mg total) by mouth once as needed for edema or fluid. (Patient not taking: Reported on 08/26/2022)   potassium chloride SA (KLOR-CON M) 20 MEQ tablet Take 0.5-1 tablets (10-20 mEq total)  by mouth once as needed (take with lasix when taking). (Patient not taking: Reported on 08/26/2022)   No facility-administered encounter medications on file as of 08/26/2022.    Allergies (verified) Betadine [povidone iodine] and Iodine   History: 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    Right, .4 x 1.3 cm, noted on [place] ABD 05/2016, left lobe of the liver with the largest measuring 9.9 mm in diameter [place] ABD 06/2010   Low back sprain    Nonsustained ventricular tachycardia (HCC)    by Holter,  noraml Stress test ECHO perpatient Katrinka Blazing, [place])   Obese    Osteoporosis    Sinusitis    Urinary frequency    Wrist fracture    Left   Past Surgical History:  Procedure Laterality Date   ABDOMINAL HYSTERECTOMY     BREAST BIOPSY Right    negative 10-12 years ago- core   CESAREAN SECTION     COLONOSCOPY  2012   CORONARY STENT INTERVENTION N/A 07/05/2022   Procedure: CORONARY STENT INTERVENTION;  Surgeon: Iran Ouch, MD;  Location: [facility];  Service: Cardiovascular;  Laterality: N/A;   CYSTOCELE REPAIR N/A 06/19/2018   Procedure: CYSTOSCOPY ANTERIOR REPAIR (CYSTOCELE);  Surgeon: Alfredo Martinez, MD;  Location: [facility];  Service: Urology;  Laterality: N/A;   LEFT HEART CATH AND CORONARY ANGIOGRAPHY N/A 07/05/2022   Procedure: LEFT HEART CATH AND CORONARY ANGIOGRAPHY;  Surgeon: Iran Ouch, MD;  Location: [facility];  Service: Cardiovascular;  Laterality: N/A;   TONSILLECTOMY     Family History  Problem Relation Age of Onset   Heart disease Mother        ATRIAL FIB   Cancer Father 29       Lung Ca,  nonsmoker, metastatic at diagnosis  asbestos exposure   Breast cancer Daughter 53       dx at age of 90 and 72   Social History   Socioeconomic History   Marital status: Married    Spouse name: Tim   Number of children: 1   Years of education: Not on file   Highest education level: 12th grade  Occupational History   Occupation: Retired  Tobacco Use   Smoking status: Former    Packs/day: 0.50    Years: 10.00    Additional pack years: 0.00    Total pack years: 5.00    Types: Cigarettes    Quit date: 04/04/1978    Years since quitting: 44.4   Smokeless tobacco: Never   Tobacco comments:    smoking cessation materials not required  Vaping  Use   Vaping Use: Never used  Substance and Sexual Activity   Alcohol use: Not Currently    Comment: wine occasionally   Drug use: No   Sexual activity: Not Currently    Birth control/protection: Surgical  Other Topics Concern   Not on file  Social History Narrative   Works part time at [employer] Strain: Low Risk  (  08/26/2022)   Overall Financial Resource Strain (CARDIA)    Difficulty of Paying Living Expenses: Not hard at all  Food Insecurity: No Food Insecurity (08/26/2022)   Hunger Vital Sign    Worried About Running Out of Food in the Last Year: Never true    Ran Out of Food in the Last Year: Never true  Transportation Needs: No Transportation Needs (08/26/2022)   PRAPARE - Administrator, Civil Service (Medical): No    Lack of Transportation (Non-Medical): No  Physical Activity: Insufficiently Active (08/26/2022)   Exercise Vital Sign    Days of Exercise per Week: 4 days    Minutes of Exercise per Session: 20 min  Stress: No Stress Concern Present (08/26/2022)   [employer] of Occupational Health - Occupational Stress Questionnaire    Feeling of Stress : Not at all  Social Connections: Moderately Isolated (08/26/2022)   Social Connection and Isolation Panel [NHANES]    Frequency of Communication with Friends and Family: More than three times a week    Frequency of Social Gatherings with Friends and Family: More than three times a week    Attends Religious Services: Never    Database administrator or Organizations: No    Attends Engineer, structural: Never    Marital Status: Married    Tobacco Counseling Counseling given: No Tobacco comments: smoking cessation materials not required   Clinical Intake:  Pre-visit preparation completed: No  Pain : 0-10 Pain Score: 4  Pain Type: Acute pain Pain Location: Foot Pain Orientation: Right Pain Descriptors / Indicators: Cramping Pain Onset: 1 to  4 weeks ago Pain Frequency: Occasional     Nutritional Status: BMI 25 -29 Overweight Nutritional Risks: Unintentional weight gain Diabetes: No  How often do you need to have someone help you when you read instructions, pamphlets, or other written materials from your doctor or pharmacy?: 1 - Never  Diabetic?No  Interpreter Needed?: No  Information entered by :: Jacqueline Kaiser   Activities of Daily Living    08/26/2022    3:07 PM 08/05/2022    2:36 PM  In your present state of health, do you have any difficulty performing the following activities:  Hearing? 1 0  Vision? 1 0  Difficulty concentrating or making decisions? 0 0  Walking or climbing stairs? 0 0  Dressing or bathing? 0 0  Doing errands, shopping? 0 0    Patient Care Team: Alba Cory, MD as PCP - General (Family Medicine) Duke Salvia, MD as PCP - Cardiology (Cardiology) Tedd Sias Marlana Salvage, MD as Physician Assistant (Endocrinology)  Indicate any recent Medical Services you may have received from other than [facility] providers in the past year (date may be approximate).     Assessment:   This is a routine wellness examination for Jacqueline Kaiser.   Hearing/Vision screen Denies any hearing issues. Denies any change to her vision. Wear glasses. Annual Eye Exam.   Dietary issues and exercise activities discussed:     Goals Addressed   None    Depression Screen    08/26/2022    3:07 PM 08/05/2022    2:36 PM 07/18/2022   11:12 AM 07/04/2022    9:16 AM 01/04/2022    8:17 AM 12/07/2021    3:26 PM 09/08/2021   10:42 AM  PHQ 2/9 Scores  PHQ - 2 Score 0 0 0 0 0 0 0  PHQ- 9 Score  0 0 0 0 0     Fall Risk  08/26/2022    3:07 PM 08/05/2022    2:36 PM 07/18/2022   11:11 AM 07/04/2022    9:16 AM 01/04/2022    8:17 AM  Fall Risk   Falls in the past year? 0 0 0 0 0  Number falls in past yr: 0 0 0 0 0  Injury with Fall? 0 0 0 0 0  Risk for fall due to : No Fall Risks No Fall Risks No Fall Risks No Fall Risks No Fall Risks   Follow up Falls evaluation completed Falls prevention discussed;Education provided;Falls evaluation completed Falls prevention discussed Falls prevention discussed Falls prevention discussed    FALL RISK PREVENTION PERTAINING TO THE HOME:  Any stairs in or around the home? Yes  If so, are there any without handrails? No  Home free of loose throw rugs in walkways, pet beds, electrical cords, etc? Yes  Adequate lighting in your home to reduce risk of falls? Yes   ASSISTIVE DEVICES UTILIZED TO PREVENT FALLS:  Life alert? No  Use of a cane, walker or w/c? No  Grab bars in the bathroom? Yes  Shower chair or bench in shower? No  Elevated toilet seat or a handicapped toilet? No   TIMED UP AND GO:  Was the test performed?Unable to perform, virtual appointment    Cognitive Function:        08/26/2022    3:11 PM 01/08/2019    8:27 AM 01/04/2018   10:25 AM 03/21/2016    8:31 AM  6CIT Screen  What Year? 0 points 0 points 0 points 0 points  What month? 0 points 0 points 0 points 0 points  What time? 0 points 0 points 0 points 0 points  Count back from 20 0 points 0 points 0 points 0 points  Months in reverse 0 points 0 points 0 points 0 points  Repeat phrase 2 points 4 points 4 points   Total Score 2 points 4 points 4 points     Immunizations Immunization History  Administered Date(s) Administered   Fluad Quad(high Dose 65+) 12/03/2018, 12/04/2019, 01/04/2021, 01/04/2022   Influenza Split 01/06/2012, 12/15/2012   Influenza-Unspecified 12/04/2015, 12/18/2016, 12/29/2017   PFIZER(Purple Top)SARS-COV-2 Vaccination 04/29/2019, 05/20/2019, 03/03/2020, 07/03/2020   Pneumococcal Conjugate-13 12/15/2014   Pneumococcal Polysaccharide-23 12/15/2007, 08/30/2017   Tdap 10/13/2013, 10/30/2018   Zoster Recombinat (Shingrix) 10/30/2018, 03/08/2019    TDAP status: Up to date  Flu Vaccine status: Up to date  Pneumococcal vaccine status: Up to date  Covid-19 vaccine status: Information  provided on how to obtain vaccines.   Qualifies for Shingles Vaccine? Yes   Zostavax completed Yes   Shingrix Completed?: Yes  Screening Tests Health Maintenance  Topic Date Due   COVID-19 Vaccine (5 - 2023-24 season) 12/03/2021   MAMMOGRAM  03/02/2022   INFLUENZA VACCINE  11/03/2022   Medicare Annual Wellness (AWV)  08/26/2023   Fecal DNA (Cologuard)  07/16/2024   DTaP/Tdap/Td (3 - Td or Tdap) 10/29/2028   Pneumonia Vaccine 25+ Years old  Completed   DEXA SCAN  Completed   Hepatitis C Screening  Completed   Zoster Vaccines- Shingrix  Completed   HPV VACCINES  Aged Out   Colonoscopy  Discontinued    Health Maintenance  Health Maintenance Due  Topic Date Due   COVID-19 Vaccine (5 - 2023-24 season) 12/03/2021   MAMMOGRAM  03/02/2022    Colorectal cancer screening: No longer required.   Mammogram status: Completed 03/02/2021. Repeat every year. Scheduled 09/12/2022  DEXA Scan: 02/25/2020  Lung Cancer Screening: (Low Dose CT Chest recommended if Age 16-80 years, 30 pack-year currently smoking OR have quit w/in 15years.) does not qualify.   Lung Cancer Screening Referral: not applicable   Additional Screening:  Hepatitis C Screening: does qualify; Completed 12/19/2014  Vision Screening: Recommended annual ophthalmology exams for early detection of glaucoma and other disorders of the eye. Is the patient up to date with their annual eye exam?  Yes  Who is the provider or what is the name of the office in which the patient attends annual eye exams? [facility]  If pt is not established with a provider, would they like to be referred to a provider to establish care? No .   Dental Screening: Recommended annual dental exams for proper oral hygiene  Community Resource Referral / Chronic Care Management: CRR required this visit?  No   CCM required this visit?  No      Plan:     I have personally reviewed and noted the following in the patient's chart:    Medical and social history Use of alcohol, tobacco or illicit drugs  Current medications and supplements including opioid prescriptions. Patient is not currently taking opioid prescriptions. Functional ability and status Nutritional status Physical activity Advanced directives List of other physicians Hospitalizations, surgeries, and ER visits in previous 12 months Vitals Screenings to include cognitive, depression, and falls Referrals and appointments  In addition, I have reviewed and discussed with patient certain preventive protocols, quality metrics, and best practice recommendations. A written personalized care plan for preventive services as well as general preventive health recommendations were provided to patient.    Jacqueline Kaiser , Thank you for taking time to come for your Medicare Wellness Visit. I appreciate your ongoing commitment to your health goals. Please review the following plan we discussed and let me know if I can assist you in the future.   These are the goals we discussed:  Goals   None     This is a list of the screening recommended for you and due dates:  Health Maintenance  Topic Date Due   COVID-19 Vaccine (5 - 2023-24 season) 12/03/2021   Mammogram  03/02/2022   Flu Shot  11/03/2022   Medicare Annual Wellness Visit  08/26/2023   Cologuard (Stool DNA test)  07/16/2024   DTaP/Tdap/Td vaccine (3 - Td or Tdap) 10/29/2028   Pneumonia Vaccine  Completed   DEXA scan (bone density measurement)  Completed   Hepatitis C Screening  Completed   Zoster (Shingles) Vaccine  Completed   HPV Vaccine  Aged Out   Colon Cancer Screening  Discontinued     Jacqueline Kaiser, [facility]   08/26/2022   Nurse Notes: Approximately 30 minute Non-Face -To-Face Medicare Wellness Visit

## 2022-08-31 DIAGNOSIS — M25571 Pain in right ankle and joints of right foot: Secondary | ICD-10-CM | POA: Diagnosis not present

## 2022-09-02 DIAGNOSIS — M25571 Pain in right ankle and joints of right foot: Secondary | ICD-10-CM | POA: Diagnosis not present

## 2022-09-05 DIAGNOSIS — M25571 Pain in right ankle and joints of right foot: Secondary | ICD-10-CM | POA: Diagnosis not present

## 2022-09-06 ENCOUNTER — Encounter: Payer: PPO | Admitting: Podiatry

## 2022-09-06 ENCOUNTER — Encounter: Payer: Self-pay | Admitting: Podiatry

## 2022-09-06 ENCOUNTER — Ambulatory Visit (INDEPENDENT_AMBULATORY_CARE_PROVIDER_SITE_OTHER): Payer: PPO | Admitting: Podiatry

## 2022-09-06 VITALS — BP 145/64 | HR 66

## 2022-09-06 DIAGNOSIS — M722 Plantar fascial fibromatosis: Secondary | ICD-10-CM | POA: Diagnosis not present

## 2022-09-06 NOTE — Progress Notes (Signed)
Chief Complaint  Patient presents with   Routine Post Op    "It's not doing, it swells."    Subjective:  Patient presents today status post gastroc aponeurosis lengthening with endoscopic plantar fasciotomy of the right lower extremity.  DOS: 05/12/2022.  Patient continues to have pain to the forefoot. She is going to physical therapy.   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   Right, .4 x 1.3 cm, noted on [place] ABD 05/2016, left lobe of the liver with the largest measuring 9.9 mm in diameter [place] ABD 06/2010   Low back sprain    Nonsustained ventricular tachycardia (HCC)    by Holter,  noraml Stress test ECHO perpatient Katrinka Blazing, [place])   Obese    Osteoporosis    Sinusitis    Urinary frequency    Wrist fracture    Left    Past Surgical History:  Procedure Laterality Date   ABDOMINAL HYSTERECTOMY     BREAST BIOPSY Right    negative 10-12 years ago- core   CESAREAN SECTION     COLONOSCOPY  2012   CORONARY STENT INTERVENTION N/A 07/05/2022   Procedure: CORONARY STENT INTERVENTION;  Surgeon: Iran Ouch, MD;  Location: [facility];  Service: Cardiovascular;  Laterality: N/A;   CYSTOCELE REPAIR N/A 06/19/2018   Procedure: CYSTOSCOPY ANTERIOR REPAIR (CYSTOCELE);  Surgeon: Alfredo Martinez, MD;  Location: [facility];  Service: Urology;  Laterality: N/A;   LEFT HEART CATH AND CORONARY ANGIOGRAPHY N/A 07/05/2022   Procedure: LEFT HEART CATH AND CORONARY ANGIOGRAPHY;  Surgeon: Iran Ouch, MD;  Location: [facility];  Service: Cardiovascular;  Laterality: N/A;   TONSILLECTOMY      Allergies  Allergen Reactions   Betadine [Povidone Iodine] Other  (See Comments)    Burns   Iodine     Objective/Physical Exam Neurovascular status intact.  Incisions healed.  No pain or tenderness associated to the heel or posterior calf.  Muscle strength 5/5 all compartments  Continued pain and tenderness more focused to the second and third metatarsals of the forefoot. Clinically correlating for possible stress fracture.   No calf pain.  Pulses are palpable.  Cap refill immediate.  Radiographic exam RT foot 08/08/2022 Normal osseous mineralization.  Joint spaces preserved.  No indication of fracture.  Impression: Negative  Assessment: 1. s/p endoscopic plantar fasciotomy with gastroc aponeurosis lengthening right. DOS: 05/12/2022 2. Stress fracture RT 2nd and 3rd metatarsals based on clinical exam  -Patient evaluated.   -I do believe the patient is suffering from stress fractures returning to activity after surgery.  Stressed the importance of reducing activity.  Discontinue physical therapy for now -Patient has a good pair of supportive shoes that do not allow much movement of the forefoot.  She may continue to wear them -RICE -Return to clinic 6 weeks follow-up x-ray  Felecia Shelling, DPM [facility]  Dr. Felecia Shelling, DPM    [address]  [place], [place] [address]                Office [phone]  Fax [phone]

## 2022-09-12 ENCOUNTER — Ambulatory Visit
Admission: RE | Admit: 2022-09-12 | Discharge: 2022-09-12 | Disposition: A | Discharge status: Home / Self Care | Payer: PPO | Source: Ambulatory Visit | Attending: Family Medicine | Admitting: Family Medicine

## 2022-09-12 DIAGNOSIS — Z1231 Encounter for screening mammogram for malignant neoplasm of breast: Secondary | ICD-10-CM | POA: Insufficient documentation

## 2022-10-03 ENCOUNTER — Ambulatory Visit: Payer: PPO | Admitting: Podiatry

## 2022-10-03 ENCOUNTER — Ambulatory Visit (INDEPENDENT_AMBULATORY_CARE_PROVIDER_SITE_OTHER): Payer: PPO

## 2022-10-03 DIAGNOSIS — M7751 Other enthesopathy of right foot: Secondary | ICD-10-CM

## 2022-10-03 MED ORDER — BETAMETHASONE SOD PHOS & ACET 6 (3-3) MG/ML IJ SUSP
3.0000 mg | Freq: Once | INTRAMUSCULAR | Status: AC
Start: 2022-10-03 — End: 2022-10-03
  Administered 2022-10-03: 3 mg via INTRA_ARTICULAR

## 2022-10-03 NOTE — Progress Notes (Signed)
Chief Complaint  Patient presents with   Plantar Fasciitis    Pt stated that she still has some pain at times     Subjective:  Patient presents today status post gastroc aponeurosis lengthening with endoscopic plantar fasciotomy of the right lower extremity.  DOS: 05/12/2022.  Patient continues to have pain to the forefoot. She is going to physical therapy.   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   Right, .4 x 1.3 cm, noted on [place] ABD 05/2016, left lobe of the liver with the largest measuring 9.9 mm in diameter [place] ABD 06/2010   Low back sprain    Nonsustained ventricular tachycardia (HCC)    by Holter,  noraml Stress test ECHO perpatient Katrinka Blazing, [place])   Obese    Osteoporosis    Sinusitis    Urinary frequency    Wrist fracture    Left    Past Surgical History:  Procedure Laterality Date   ABDOMINAL HYSTERECTOMY     BREAST BIOPSY Right    negative 10-12 years ago- core   CESAREAN SECTION     COLONOSCOPY  2012   CORONARY STENT INTERVENTION N/A 07/05/2022   Procedure: CORONARY STENT INTERVENTION;  Surgeon: Iran Ouch, MD;  Location: [facility];  Service: Cardiovascular;  Laterality: N/A;   CYSTOCELE REPAIR N/A 06/19/2018   Procedure: CYSTOSCOPY ANTERIOR REPAIR (CYSTOCELE);  Surgeon: Alfredo Martinez, MD;  Location: [facility];  Service: Urology;  Laterality: N/A;   LEFT HEART CATH AND CORONARY ANGIOGRAPHY N/A 07/05/2022   Procedure: LEFT HEART CATH AND CORONARY ANGIOGRAPHY;  Surgeon: Iran Ouch, MD;  Location: [facility];  Service: Cardiovascular;  Laterality: N/A;   TONSILLECTOMY      Allergies  Allergen Reactions   Betadine  [Povidone Iodine] Other (See Comments)    Burns   Iodine     Objective/Physical Exam Neurovascular status intact.  Incisions healed.  No pain or tenderness associated to the heel or posterior calf.  Muscle strength 5/5 all compartments  Continued pain and tenderness more focused to the second and third metatarsals of the forefoot. Clinically correlating for possible stress fracture.   No calf pain.  Pulses are palpable.  Cap refill immediate.  Radiographic exam RT foot 08/08/2022 Normal osseous mineralization.  Joint spaces preserved.  No indication of fracture.  Impression: Negative  Assessment: 1. s/p endoscopic plantar fasciotomy with gastroc aponeurosis lengthening right. DOS: 05/12/2022 2. Stress fracture RT 2nd and 3rd metatarsals based on clinical exam  -Patient evaluated.   -No improvement.  Patient continues to have some pain and tenderness now diffusely throughout the entire foot and lateral column as well as the second and third MTP. -Injection of 0.5 cc Celestone Soluspan injected on the second and third MTP right -Injection of 0.5 cc Celestone Soluspan injected around the fourth and fifth TMT of the right foot -Continue good supportive tennis shoes with OTC arch supports and metatarsal offloading pad -Continue Tylenol as needed -Prescription for Medrol Dosepak -Patient will let me know in 3-4 weeks  if there is no improvement.  If there is no improvement I will go ahead and order an MRI RT foot wo contrast   Felecia Shelling, DPM [facility]  Dr. Felecia Shelling, DPM    [address] [place], [place] [address]                Office [phone]  Fax [phone]

## 2022-10-11 ENCOUNTER — Telehealth: Payer: Self-pay | Admitting: Podiatry

## 2022-10-11 NOTE — Telephone Encounter (Signed)
Pt stated that her shot worn off yesterday and in pain again she wants to move forward with MRI.   Please advise

## 2022-10-16 ENCOUNTER — Encounter: Payer: Self-pay | Admitting: Podiatry

## 2022-10-16 ENCOUNTER — Other Ambulatory Visit: Payer: Self-pay | Admitting: Podiatry

## 2022-10-16 DIAGNOSIS — M722 Plantar fascial fibromatosis: Secondary | ICD-10-CM

## 2022-10-16 DIAGNOSIS — M7751 Other enthesopathy of right foot: Secondary | ICD-10-CM

## 2022-10-18 ENCOUNTER — Ambulatory Visit: Payer: PPO | Admitting: Podiatry

## 2022-10-25 ENCOUNTER — Other Ambulatory Visit: Payer: Self-pay | Admitting: Physician Assistant

## 2022-10-29 ENCOUNTER — Ambulatory Visit
Admission: RE | Admit: 2022-10-29 | Discharge: 2022-10-29 | Disposition: A | Discharge status: Home / Self Care | Payer: PPO | Source: Ambulatory Visit | Attending: Podiatry | Admitting: Podiatry

## 2022-10-29 DIAGNOSIS — S92324A Nondisplaced fracture of second metatarsal bone, right foot, initial encounter for closed fracture: Secondary | ICD-10-CM | POA: Diagnosis not present

## 2022-10-29 DIAGNOSIS — M722 Plantar fascial fibromatosis: Secondary | ICD-10-CM

## 2022-10-29 DIAGNOSIS — R600 Localized edema: Secondary | ICD-10-CM | POA: Diagnosis not present

## 2022-10-29 DIAGNOSIS — M79671 Pain in right foot: Secondary | ICD-10-CM | POA: Diagnosis not present

## 2022-10-29 DIAGNOSIS — M7751 Other enthesopathy of right foot: Secondary | ICD-10-CM

## 2022-11-10 ENCOUNTER — Telehealth: Payer: Self-pay

## 2022-11-10 NOTE — Telephone Encounter (Signed)
[facility] incoming call report MRI Right foot Confirming that we received the report

## 2022-11-11 NOTE — Telephone Encounter (Signed)
Spoke with patient. Reviewed MRI results reviewed. She is somewhat comfortable in good sturdy shoes. Continue. RICE.  She takes Tylenol as needed.  Continue.  Return to clinic 8 weeks for follow-up.  Felecia Shelling, DPM [facility]  Dr. Felecia Shelling, DPM    [address] [place], [place] [address]                Office [phone]  Fax [phone]

## 2022-11-18 ENCOUNTER — Ambulatory Visit: Payer: PPO | Admitting: Podiatry

## 2022-12-26 ENCOUNTER — Encounter: Payer: Self-pay | Admitting: Urology

## 2022-12-26 ENCOUNTER — Ambulatory Visit: Payer: PPO | Admitting: Urology

## 2022-12-26 DIAGNOSIS — N3946 Mixed incontinence: Secondary | ICD-10-CM | POA: Diagnosis not present

## 2022-12-26 LAB — MICROSCOPIC EXAMINATION

## 2022-12-26 LAB — URINALYSIS, COMPLETE
Bilirubin, UA: NEGATIVE
Glucose, UA: NEGATIVE
Ketones, UA: NEGATIVE
Nitrite, UA: NEGATIVE
Protein,UA: NEGATIVE
Specific Gravity, UA: 1.015 (ref 1.005–1.030)
Urobilinogen, Ur: 0.2 mg/dL (ref 0.2–1.0)
pH, UA: 5.5 (ref 5.0–7.5)

## 2022-12-26 MED ORDER — MIRABEGRON ER 50 MG PO TB24
50.0000 mg | ORAL_TABLET | Freq: Every day | ORAL | 11 refills | Status: DC
Start: 2022-12-26 — End: 2023-02-06

## 2022-12-26 NOTE — Progress Notes (Signed)
12/26/2022 10:22 AM   Jacqueline Kaiser [birth year] [id number]  Referring provider: Alba Cory, MD [address] [place],  [place] [address]  Chief Complaint  Patient presents with   Establish Care   Urinary Incontinence    HPI: Patient has cystocele repair on June 19, 2018.  She saw our nurse practitioner in May and she was pleased with the repair.  She was having some nocturia and was no longer on Myrbetriq and she did not feel she still needed it.  She was no longer using vaginal estrogen cream and did not feel she needed either medication.   Bladder little bit urgent but otherwise doing well.  No pain.  No incontinence.  Frequency stable   On vaginal examination she had very good support anteriorly posteriorly with good vaginal length and no stress incontinence.  She had some poor estrogenization changes near the urethra.   Today The patient is complaining of urge incontinence and foot on the floor syndrome wearing 1 or 2 damp many pads per day.  She gets up once a night.  She can leak some with coughing sneezing and is seeing her primary care doctor tomorrow for constipation.  She is time voiding trying to stay drier.  She has a lot of urgency especially no the night   When I saw her prior to her surgery she was voiding every 1-2 hours and Myrbetriq was helping some of her urgency.  She still has low back discomfort that is nonspecific.  We had separated urgency goals first prolapse goals at that time.  Her bladder capacity was 320 mL.  She did not have stress incontinence reaching a pressure of 126 cm water.  She had bladder overactivity reaching a pressure of 13 cm of water leaking a mild to moderate amount   Symptoms definitely worse at night in the last month especially   Clinically not infected though urine strong smelling and sent for culture    Patient is gradually having more urgency and even urge incontinence. I will call if culture is positive. I gave her  Myrbetriq 50 mg samples and prescription. I gave her oxybutynin ER 10 mg recognizing it could worsen her constipation. We had a little bit of a circular conversation but there is no question she had this prior. Probably also has an element of a nocturnal diuresis   Today I last saw the patient in 2021 In the last 6 months patient is having a little bit of urge incontinence.  She wears 1 liner that sometimes is damp and sometimes dry.  Clinically not infected.  Leaked a few drops twice with bedwetting.  Voids every 3 hours gets up twice a night.       PMH: 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   Right, .4 x 1.3 cm, noted on [place] ABD 05/2016, left lobe of the liver with the largest measuring 9.9 mm in diameter [place] ABD 06/2010   Low back sprain    Nonsustained ventricular tachycardia (HCC)  by Holter,  noraml Stress test ECHO perpatient Katrinka Blazing, [place])   Obese    Osteoporosis    Sinusitis    Urinary frequency    Wrist fracture    Left    Surgical History: Past Surgical History:  Procedure Laterality Date   ABDOMINAL HYSTERECTOMY     BREAST BIOPSY Right    negative 10-12 years ago- core   CESAREAN SECTION     COLONOSCOPY  2012   CORONARY STENT INTERVENTION N/A 07/05/2022   Procedure: CORONARY STENT INTERVENTION;  Surgeon: Iran Ouch, MD;  Location: [facility] INVASIVE CV LAB;  Service: Cardiovascular;  Laterality: N/A;   CYSTOCELE REPAIR N/A 06/19/2018   Procedure: CYSTOSCOPY ANTERIOR REPAIR (CYSTOCELE);  Surgeon: Alfredo Martinez, MD;  Location: [facility];  Service: Urology;  Laterality: N/A;   LEFT HEART CATH AND CORONARY ANGIOGRAPHY N/A 07/05/2022   Procedure: LEFT  HEART CATH AND CORONARY ANGIOGRAPHY;  Surgeon: Iran Ouch, MD;  Location: [facility] INVASIVE CV LAB;  Service: Cardiovascular;  Laterality: N/A;   TONSILLECTOMY      Home Medications:  Allergies as of 12/26/2022       Reactions   Betadine [povidone Iodine] Other (See Comments)   Burns   Iodine         Medication List        Accurate as of December 26, 2022 10:22 AM. If you have any questions, ask your nurse or doctor.          aspirin EC 81 MG tablet Take 1 tablet (81 mg total) by mouth daily. Swallow whole.   calcium carbonate 600 MG Tabs tablet Commonly known as: OS-CAL Take by mouth.   cholecalciferol 25 MCG (1000 UNIT) tablet Commonly known as: VITAMIN D3 Take 1,000 Units by mouth daily.   clopidogrel 75 MG tablet Commonly known as: PLAVIX Take 1 tablet (75 mg total) by mouth daily with breakfast.   cyanocobalamin 500 MCG tablet Commonly known as: VITAMIN B12 Take 500 mcg by mouth daily.   furosemide 20 MG tablet Commonly known as: LASIX Take 0.5-1 tablets (10-20 mg total) by mouth once as needed for edema or fluid.   meclizine 12.5 MG tablet Commonly known as: ANTIVERT Take 1 tablet (12.5 mg total) by mouth 4 (four) times daily as needed for dizziness.   pantoprazole 40 MG tablet Commonly known as: PROTONIX TAKE 1 TABLET BY MOUTH DAILY   polyethylene glycol powder 17 GM/SCOOP powder Commonly known as: GLYCOLAX/MIRALAX Take 17 g by mouth once. PRN   potassium chloride SA 20 MEQ tablet Commonly known as: KLOR-CON M Take 0.5-1 tablets (10-20 mEq total) by mouth once as needed (take with lasix when taking).   Prolia 60 MG/ML Sosy injection Generic drug: denosumab Inject 1 mL into the skin every 6 (six) months.   QUEtiapine 25 MG tablet Commonly known as: SEROquel Take 1 tablet (25 mg total) by mouth at bedtime.   rosuvastatin 20 MG tablet Commonly known as: CRESTOR TAKE 1 TABLET BY MOUTH EVERY EVENING   vitamin C 100 MG tablet Take 100  mg by mouth daily.        Allergies:  Allergies  Allergen Reactions   Betadine [Povidone Iodine] Other (See Comments)    Burns   Iodine     Family History: Family History  Problem Relation Age of Onset   Heart disease Mother        ATRIAL FIB   Cancer Father 77       Lung Ca,  nonsmoker, metastatic  at diagnosis  asbestos exposure   Breast cancer Daughter 37       dx at age of 57 and 74    Social History:  reports that she quit smoking about 44 years ago. Her smoking use included cigarettes. She started smoking about 54 years ago. She has a 5 pack-year smoking history. She has never used smokeless tobacco. She reports that she does not currently use alcohol. She reports that she does not use drugs.  ROS:                                        Physical Exam: There were no vitals taken for this visit.  Constitutional:  Alert and oriented, No acute distress.   Laboratory Data: Lab Results  Component Value Date   WBC 3.7 (L) 07/06/2022   HGB 13.1 07/06/2022   HCT 38.8 07/06/2022   MCV 89.0 07/06/2022   PLT 180 07/06/2022    Lab Results  Component Value Date   CREATININE 0.92 08/05/2022    No results found for: "PSA"  No results found for: "TESTOSTERONE"  No results found for: "HGBA1C"  Urinalysis    Component Value Date/Time   COLORURINE YELLOW 03/22/2016 0957   APPEARANCEUR Clear 05/06/2019 1011   LABSPEC 1.010 03/22/2016 0957   PHURINE 5.0 03/22/2016 0957   GLUCOSEU Negative 05/06/2019 1011   GLUCOSEU NEGATIVE 03/22/2016 0957   HGBUR NEGATIVE 03/22/2016 0957   BILIRUBINUR Negative 05/06/2019 1011   KETONESUR NEGATIVE 03/22/2016 0957   PROTEINUR Negative 05/06/2019 1011   UROBILINOGEN 0.2 03/22/2016 0957   NITRITE Negative 05/06/2019 1011   NITRITE NEGATIVE 03/22/2016 0957   LEUKOCYTESUR Negative 05/06/2019 1011    Pertinent Imaging: Urine reviewed and sent for culture.  Chart reviewed  Assessment & Plan: Reassess in 6  weeks on Myrbetriq 50 mg 30 x 11.  Only repeat testing and physical examination if were not reaching her treatment goal.  She has mild overactive bladder with mild urge incontinence  1. Mixed incontinence  - Urinalysis, Complete   No follow-ups on file.  Martina Sinner, MD  [facility] [address] [place], [place] [address] [phone]

## 2022-12-27 ENCOUNTER — Ambulatory Visit: Payer: PPO | Admitting: Podiatry

## 2022-12-27 ENCOUNTER — Encounter: Payer: Self-pay | Admitting: Podiatry

## 2022-12-27 VITALS — BP 114/64 | HR 68

## 2022-12-27 DIAGNOSIS — M84374D Stress fracture, right foot, subsequent encounter for fracture with routine healing: Secondary | ICD-10-CM | POA: Diagnosis not present

## 2022-12-27 DIAGNOSIS — M810 Age-related osteoporosis without current pathological fracture: Secondary | ICD-10-CM | POA: Diagnosis not present

## 2022-12-27 NOTE — Progress Notes (Signed)
Chief Complaint  Patient presents with   Foot Pain    "He's going to check this stupid foot again.  I have stayed off it as much as possible.  I haven't worked.  If it's going to hurt, it's going to hurt."    Subjective:  Patient presents today status post gastroc aponeurosis lengthening with endoscopic plantar fasciotomy of the right lower extremity.  DOS: 05/12/2022.  Diagnosed with stress fractures of the second and third metatarsals based on MRI performed 10/29/2022.  Patient states that overall she is feeling significantly better.  She has been resting her foot as instructed.  She does have some very mild intermittent tenderness but she says overall it is very tolerable.  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   Right, .4 x 1.3 cm, noted on [place] ABD 05/2016, left lobe of the liver with the largest measuring 9.9 mm in diameter [place] ABD 06/2010   Low back sprain    Nonsustained ventricular tachycardia (HCC)    by Holter,  noraml Stress test ECHO perpatient Katrinka Blazing, [place])   Obese    Osteoporosis    Sinusitis    Urinary frequency    Wrist fracture    Left    Past Surgical History:  Procedure Laterality Date   ABDOMINAL HYSTERECTOMY     BREAST BIOPSY Right    negative 10-12 years ago- core   CESAREAN SECTION     COLONOSCOPY  2012   CORONARY STENT INTERVENTION N/A 07/05/2022   Procedure: CORONARY STENT INTERVENTION;  Surgeon: Iran Ouch, MD;  Location: [facility] INVASIVE CV LAB;  Service: Cardiovascular;  Laterality: N/A;   CYSTOCELE REPAIR N/A 06/19/2018   Procedure: CYSTOSCOPY ANTERIOR REPAIR (CYSTOCELE);  Surgeon: Alfredo Martinez, MD;  Location: [facility];  Service:  Urology;  Laterality: N/A;   LEFT HEART CATH AND CORONARY ANGIOGRAPHY N/A 07/05/2022   Procedure: LEFT HEART CATH AND CORONARY ANGIOGRAPHY;  Surgeon: Iran Ouch, MD;  Location: [facility] INVASIVE CV LAB;  Service: Cardiovascular;  Laterality: N/A;   TONSILLECTOMY      Allergies  Allergen Reactions   Betadine [Povidone Iodine] Other (See Comments)    Burns   Iodine     Objective/Physical Exam Neurovascular status intact.  Incisions healed.  No pain or tenderness associated to the heel or posterior calf.  Muscle strength 5/5 all compartments  The pain and tenderness associated to the second and third metatarsals has improved significantly.  There is only very mild tenderness with palpation.  Clinically correlating for possible stress fracture.   No calf pain.  Pulses are palpable.  Cap refill immediate.  Radiographic exam RT foot 08/08/2022 Normal osseous mineralization.  Joint spaces preserved.  No indication of fracture.  Impression: Negative  MR FOOT RIGHT WO CONTRAST 10/29/2022 IMPRESSION: 1. Acute nondisplaced fracture at the base of the second metatarsal with severe surrounding bone marrow edema and soft tissue edema. 2. Minimal marrow edema in the proximal third metatarsal metaphysis concerning for mild stress reaction.  Assessment: 1. s/p endoscopic plantar fasciotomy with gastroc  aponeurosis lengthening right. DOS: 05/12/2022 2. Stress fracture RT 2nd and 3rd metatarsals  -Patient evaluated -Overall the patient is doing significantly better.  She says that the pain is very intermittent and very tolerable.  She has noticed a significant downward trend in the symptoms over the last 6 weeks. -Continue wearing good supportive shoes and sneakers -Patient may slowly increase activity as tolerated -Return to clinic as needed  Felecia Shelling, DPM [facility]  Dr. Felecia Shelling, DPM    [address] [place], [place] [address]                 Office [phone]  Fax [phone]

## 2022-12-29 LAB — CULTURE, URINE COMPREHENSIVE

## 2023-01-13 NOTE — Progress Notes (Signed)
Name: Jacqueline Kaiser   MRN: [id number]    DOB: 1945-04-22   Date:01/16/2023       Progress Note  Subjective  Chief Complaint  Follow Up  HPI  CAD: she had chest pain back in April, had stent placed and no chest pain or SOB since. She is currently on Plavix and aspirin and has easy bruising and bleeding.   Senile purpura: likely exacerbated since started on Plavix.   Osteoporosis history of vertebral compression fracture:  She is still getting Prolia with Dr. Tedd Sias and denies side effects of medication.  Last infusion was 12/2022  goes twice a year, continue vitamin D supplementation    Chronic lumbar spine pain: she states it has been going on for years, however fall of 2018 pain was severe and did not improve with chiropractor care, she was sent to [facility] and found to have radiculitis but she is doing well now  She states pain now is intermittent , no problems when walking or standing anymore She goes to chiropractor , Dr. Chriss Czar every 2 weeks for adjustment and is doing well    GERD: she states symptoms are controlled taking it prn only when she knows she will splurge. She is now on pantoprazole due plavix, no longer on omeprazole due to drug to drug interaction  Mixed incontinence: seeing Urologist and is now on Myrbetriq about one month ago, she continues to have urgency   CKI stage IIIa: stable, she has good urine output and no pruritus    NSVT: she states diagnosed years ago by a cardiologist, no chest pain or palpation, she denies any symptoms. Denies decrease in exercise tolerance. She denies any palpitation , still under the care of Dr. Graciela Husbands   Hyperlipidemia: taking Crestor, denies myalgia, chest pain or palpitation. Last LDL was down from 123 to 81, lipoprotein level was 61.6     Insomnia: she states she falls asleep without problems, but she was waking up  in the middle of the night but since started on Seroquel she is able to go back to bed, continue medication    Vertigo: history of vertigo but no recent episodes, having some tinnitus  for the past couple of weeks, discussed referral to ENT  Plantar Fascitis: she had foot surgery Feb 2024 , and is now dealing with a stress fracture on right foot and she has not been as active, causing weight gain.   Patient Active Problem List   Diagnosis Date Noted   CAD (coronary artery disease) 07/06/2022   Hyperlipidemia LDL goal <70 07/06/2022   Unstable angina (HCC) 07/05/2022   Stage 3a chronic kidney disease (HCC) 07/04/2022   Coccyalgia 01/11/2021   Cystocele with prolapse 06/19/2018   Age-related osteoporosis without current pathological fracture 01/08/2018   History of vertebral compression fracture 05/23/2017   Incomplete bladder emptying 05/16/2016   GERD (gastroesophageal reflux disease) 02/14/2016   Insomnia due to anxiety and fear 02/14/2016   Allergy 07/01/2013   Osteoporosis of lumbar spine 05/19/2013   Overweight (BMI 25.0-29.9) 08/28/2011   Peripheral vertigo 08/28/2011   Nonsustained ventricular tachycardia (HCC)    Hyperlipidemia     Past Surgical History:  Procedure Laterality Date   ABDOMINAL HYSTERECTOMY     BREAST BIOPSY Right    negative 10-12 years ago- core   CESAREAN SECTION     COLONOSCOPY  2012   CORONARY STENT INTERVENTION N/A 07/05/2022   Procedure: CORONARY STENT INTERVENTION;  Surgeon: Iran Ouch, MD;  Location: [facility]  INVASIVE CV LAB;  Service: Cardiovascular;  Laterality: N/A;   CYSTOCELE REPAIR N/A 06/19/2018   Procedure: CYSTOSCOPY ANTERIOR REPAIR (CYSTOCELE);  Surgeon: Alfredo Martinez, MD;  Location: [facility];  Service: Urology;  Laterality: N/A;   LEFT HEART CATH AND CORONARY ANGIOGRAPHY N/A 07/05/2022   Procedure: LEFT HEART CATH AND CORONARY ANGIOGRAPHY;  Surgeon: Iran Ouch, MD;  Location: [facility] INVASIVE CV LAB;  Service: Cardiovascular;  Laterality: N/A;   TONSILLECTOMY      Family History  Problem Relation Age of Onset   Heart disease Mother         ATRIAL FIB   Cancer Father 45       Lung Ca,  nonsmoker, metastatic at diagnosis  asbestos exposure   Breast cancer Daughter 32       dx at age of 41 and 46    Social History   Tobacco Use   Smoking status: Former    Current packs/day: 0.00    Average packs/day: 0.5 packs/day for 10.0 years (5.0 ttl pk-yrs)    Types: Cigarettes    Start date: 04/04/1968    Quit date: 04/04/1978    Years since quitting: 44.8    Passive exposure: Past   Smokeless tobacco: Never   Tobacco comments:    smoking cessation materials not required  Substance Use Topics   Alcohol use: Not Currently    Comment: wine occasionally     Current Outpatient Medications:    Ascorbic Acid (VITAMIN C) 100 MG tablet, Take 100 mg by mouth daily., Disp: , Rfl:    aspirin EC 81 MG tablet, Take 1 tablet (81 mg total) by mouth daily. Swallow whole., Disp: 100 tablet, Rfl: 1   calcium carbonate (OS-CAL) 600 MG TABS tablet, Take by mouth., Disp: , Rfl:    cholecalciferol (VITAMIN D3) 25 MCG (1000 UT) tablet, Take 1,000 Units by mouth daily., Disp: , Rfl:    clopidogrel (PLAVIX) 75 MG tablet, Take 1 tablet (75 mg total) by mouth daily with breakfast., Disp: 30 tablet, Rfl: 11   denosumab (PROLIA) 60 MG/ML SOSY injection, Inject 1 mL into the skin every 6 (six) months., Disp: , Rfl:    meclizine (ANTIVERT) 12.5 MG tablet, Take 1 tablet (12.5 mg total) by mouth 4 (four) times daily as needed for dizziness., Disp: 30 tablet, Rfl: 0   mirabegron ER (MYRBETRIQ) 50 MG TB24 tablet, Take 1 tablet (50 mg total) by mouth daily., Disp: 30 tablet, Rfl: 11   pantoprazole (PROTONIX) 40 MG tablet, TAKE 1 TABLET BY MOUTH DAILY, Disp: 30 tablet, Rfl: 9   polyethylene glycol powder (GLYCOLAX/MIRALAX) 17 GM/SCOOP powder, Take 17 g by mouth once. PRN, Disp: , Rfl:    QUEtiapine (SEROQUEL) 25 MG tablet, Take 1 tablet (25 mg total) by mouth at bedtime., Disp: 90 tablet, Rfl: 1   rosuvastatin (CRESTOR) 20 MG tablet, TAKE 1 TABLET BY MOUTH  EVERY EVENING, Disp: 90 tablet, Rfl: 1   vitamin B-12 (CYANOCOBALAMIN) 500 MCG tablet, Take 500 mcg by mouth daily., Disp: , Rfl:   Allergies  Allergen Reactions   Betadine [Povidone Iodine] Other (See Comments)    Burns   Iodine     I personally reviewed active problem list, medication list, allergies, family history, social history, health maintenance with the patient/caregiver today.   ROS  Ten systems reviewed and is negative except as mentioned in HPI    Objective  Vitals:   01/16/23 0953  BP: 112/64  Pulse: 74  Resp: 14  Temp:  97.6 F (36.4 C)  TempSrc: Oral  SpO2: 98%  Weight: 190 lb 11.2 oz (86.5 kg)  Height: 5\' 6"  (1.676 m)    Body mass index is 30.78 kg/m.  Physical Exam  Constitutional: Patient appears well-developed and well-nourished. Obese No distress.  HEENT: head atraumatic, normocephalic, pupils equal and reactive to light, neck supple Cardiovascular: Normal rate, regular rhythm and normal heart sounds.  No murmur heard. No BLE edema. Pulmonary/Chest: Effort normal and breath sounds normal. No respiratory distress. Abdominal: Soft.  There is no tenderness. Psychiatric: Patient has a normal mood and affect. behavior is normal. Judgment and thought content normal.    PHQ2/9:    01/16/2023    9:54 AM 08/26/2022    3:07 PM 08/05/2022    2:36 PM 07/18/2022   11:12 AM 07/04/2022    9:16 AM  Depression screen PHQ 2/9  Decreased Interest 0 0 0 0 0  Down, Depressed, Hopeless 0 0 0 0 0  PHQ - 2 Score 0 0 0 0 0  Altered sleeping 0  0 0 0  Tired, decreased energy 0  0 0 0  Change in appetite 0  0 0 0  Feeling bad or failure about yourself  0  0 0 0  Trouble concentrating 0  0 0 0  Moving slowly or fidgety/restless 0  0 0 0  Suicidal thoughts 0  0 0 0  PHQ-9 Score 0  0 0 0  Difficult doing work/chores   Not difficult at all      phq 9 is negative   Fall Risk:    01/16/2023    9:54 AM 08/26/2022    3:07 PM 08/05/2022    2:36 PM 07/18/2022   11:11  AM 07/04/2022    9:16 AM  Fall Risk   Falls in the past year? 0 0 0 0 0  Number falls in past yr:  0 0 0 0  Injury with Fall?  0 0 0 0  Risk for fall due to : No Fall Risks No Fall Risks No Fall Risks No Fall Risks No Fall Risks  Follow up Falls prevention discussed Falls evaluation completed Falls prevention discussed;Education provided;Falls evaluation completed Falls prevention discussed Falls prevention discussed     Assessment & Plan  1. Stage 3a chronic kidney disease (HCC)  We will continue to monitor, avoid nsaid's  2. Nonsustained ventricular tachycardia (HCC)  Rate controlled at this time   3. Senile purpura (HCC)  Reassurance given   4. Need for immunization against influenza  - Flu Vaccine Trivalent High Dose (Fluad)  5. Primary insomnia  - QUEtiapine (SEROQUEL) 25 MG tablet; Take 1 tablet (25 mg total) by mouth at bedtime.  Dispense: 90 tablet; Refill: 1  6. S/P angioplasty with stent  Doing well   7. Gastroesophageal reflux disease without esophagitis  Advised to try skipping doses once off Plavix  8. Mixed incontinence  Not much improvement with Myrbetriq   9. Pure hypercholesterolemia  - rosuvastatin (CRESTOR) 20 MG tablet; Take 1 tablet (20 mg total) by mouth every evening.  Dispense: 90 tablet; Refill: 1

## 2023-01-16 ENCOUNTER — Ambulatory Visit: Payer: PPO | Admitting: Family Medicine

## 2023-01-16 ENCOUNTER — Encounter: Payer: Self-pay | Admitting: Family Medicine

## 2023-01-16 VITALS — BP 112/64 | HR 74 | Temp 97.6°F | Resp 14 | Ht 66.0 in | Wt 190.7 lb

## 2023-01-16 DIAGNOSIS — K219 Gastro-esophageal reflux disease without esophagitis: Secondary | ICD-10-CM

## 2023-01-16 DIAGNOSIS — Z95820 Peripheral vascular angioplasty status with implants and grafts: Secondary | ICD-10-CM | POA: Diagnosis not present

## 2023-01-16 DIAGNOSIS — I4729 Other ventricular tachycardia: Secondary | ICD-10-CM

## 2023-01-16 DIAGNOSIS — Z23 Encounter for immunization: Secondary | ICD-10-CM

## 2023-01-16 DIAGNOSIS — E7800 Pure hypercholesterolemia, unspecified: Secondary | ICD-10-CM

## 2023-01-16 DIAGNOSIS — D692 Other nonthrombocytopenic purpura: Secondary | ICD-10-CM

## 2023-01-16 DIAGNOSIS — N3946 Mixed incontinence: Secondary | ICD-10-CM | POA: Diagnosis not present

## 2023-01-16 DIAGNOSIS — N1831 Chronic kidney disease, stage 3a: Secondary | ICD-10-CM | POA: Diagnosis not present

## 2023-01-16 DIAGNOSIS — F5101 Primary insomnia: Secondary | ICD-10-CM

## 2023-01-16 MED ORDER — ROSUVASTATIN CALCIUM 20 MG PO TABS
20.0000 mg | ORAL_TABLET | Freq: Every evening | ORAL | 1 refills | Status: DC
Start: 2023-01-16 — End: 2023-03-17

## 2023-01-16 MED ORDER — QUETIAPINE FUMARATE 25 MG PO TABS
25.0000 mg | ORAL_TABLET | Freq: Every day | ORAL | 1 refills | Status: DC
Start: 2023-01-16 — End: 2023-07-17

## 2023-01-17 ENCOUNTER — Ambulatory Visit: Payer: PPO | Admitting: Cardiology

## 2023-01-17 ENCOUNTER — Ambulatory Visit: Payer: PPO | Attending: Cardiology | Admitting: Nurse Practitioner

## 2023-01-17 ENCOUNTER — Encounter: Payer: Self-pay | Admitting: Nurse Practitioner

## 2023-01-17 VITALS — BP 110/60 | HR 86 | Ht 66.0 in | Wt 191.6 lb

## 2023-01-17 DIAGNOSIS — E785 Hyperlipidemia, unspecified: Secondary | ICD-10-CM | POA: Diagnosis not present

## 2023-01-17 DIAGNOSIS — I2510 Atherosclerotic heart disease of native coronary artery without angina pectoris: Secondary | ICD-10-CM

## 2023-01-17 DIAGNOSIS — I4729 Other ventricular tachycardia: Secondary | ICD-10-CM | POA: Diagnosis not present

## 2023-01-17 NOTE — Patient Instructions (Signed)
Medication Instructions:  Your physician has recommended you make the following change in your medication:  STOP PLAVIX   *If you need a refill on your cardiac medications before your next appointment, please call your pharmacy*   Lab Work: NONE If you have labs (blood work) drawn today and your tests are completely normal, you will receive your results only by: MyChart Message (if you have MyChart) OR A paper copy in the mail If you have any lab test that is abnormal or we need to change your treatment, we will call you to review the results.   Testing/Procedures: NONE   Follow-Up: At [facility], you and your health needs are our priority.  As part of our continuing mission to provide you with exceptional heart care, we have created designated Provider Care Teams.  These Care Teams include your primary Cardiologist (physician) and Advanced Practice Providers (APPs -  Physician Assistants and Nurse Practitioners) who all work together to provide you with the care you need, when you need it.  We recommend signing up for the patient portal called "MyChart".  Sign up information is provided on this After Visit Summary.  MyChart is used to connect with patients for Virtual Visits (Telemedicine).  Patients are able to view lab/test results, encounter notes, upcoming appointments, etc.  Non-urgent messages can be sent to your provider as well.   To learn more about what you can do with MyChart, go to [email or web address].    Your next appointment:   6 month(s)  Provider:   Sherryl Manges, MD

## 2023-01-17 NOTE — Progress Notes (Addendum)
Office Visit    Patient Name: Jacqueline Kaiser Date of Encounter: 01/17/2023  Primary Care Provider:  Alba Cory, MD Primary Cardiologist:  Sherryl Manges, MD  Chief Complaint    77 y.o. female with a history of CAD status post PCI to the mid RCA in April 2024, nonsustained VT, hyperlipidemia, GERD, obesity, and hepatic cyst, who presents for CAD follow-up.   Past Medical History   Subjective   Past Medical History:  Diagnosis Date   Allergy    Ankle fracture 2014   Left   Anxiety    CAD (coronary artery disease)    a. 07/2022 PCI: LM nl, LAD 79m, LCX mild diff dzs, OM1 30, RCA 43m, 9m/d (3.0x12 Onyx Frontier DES). EF 55-65%.   Female cystocele    Grade 2   Fracture    lower back after sneezing   GERD (gastroesophageal reflux disease)    History of   History of vertigo    last attack about 1 week ago   Hyperlipidemia    Insomnia    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   Right, .4 x 1.3 cm, noted on [place] ABD 05/2016, left lobe of the liver with the largest measuring 9.9 mm in diameter [place] ABD 06/2010   Low back sprain    Nonsustained ventricular tachycardia (HCC)    by Holter,  noraml Stress test ECHO perpatient Katrinka Blazing, [place])   Obese    Osteoporosis    Sinusitis    Urinary frequency    Vertigo    Wrist fracture    Left   Past Surgical History:  Procedure Laterality Date   ABDOMINAL HYSTERECTOMY     BREAST BIOPSY Right    negative 10-12 years ago- core   CESAREAN SECTION     COLONOSCOPY  2012   CORONARY STENT INTERVENTION N/A 07/05/2022   Procedure: CORONARY STENT INTERVENTION;  Surgeon: Iran Ouch, MD;  Location: [facility];  Service: Cardiovascular;  Laterality: N/A;   CYSTOCELE REPAIR N/A 06/19/2018   Procedure: CYSTOSCOPY ANTERIOR REPAIR (CYSTOCELE);  Surgeon: Alfredo Martinez, MD;  Location: [facility];  Service: Urology;  Laterality: N/A;   LEFT HEART CATH AND CORONARY ANGIOGRAPHY N/A 07/05/2022   Procedure: LEFT HEART CATH AND CORONARY ANGIOGRAPHY;  Surgeon:  Iran Ouch, MD;  Location: [facility];  Service: Cardiovascular;  Laterality: N/A;   TONSILLECTOMY     Allergies  Allergies  Allergen Reactions   Betadine [Povidone Iodine] Other (See Comments)    Burns   Iodine      History of Present Illness      77 y.o. y/o female with the above past medical history including CAD, hyperlipidemia, nonsustained VT, GERD, obesity, and hepatic cyst.  She established care in April 2024 due to accelerating angina.  She was admitted from the office and underwent diagnostic catheterization revealing an 80% stenosis in the mid to distal RCA and otherwise moderate, diffuse coronary artery disease.  The RCA was successfully treated with a drug-eluting stent.  EF 55 to 65% by ventriculography.    Ms. Due was last seen in cardiology clinic in April 2024, at which time she was doing well.  Since her last visit, she has done well from a cardiac standpoint and denies chest pain or dyspnea.  She does note frequent bruising in the setting of dual antiplatelet therapy and is looking forward to coming off of Plavix, now that she has completed 6 months.  She has been dealing with right foot  pain in the setting of a fracture, which has sidelined her activity some.  Her weight is up 5 pounds since the spring and she is disappointed by this.  She is looking forward to increasing her activity level and losing some weight.  She denies PND, orthopnea, dizziness, syncope, edema, or early satiety.  Objective  Home Medications    Current Outpatient Medications  Medication Sig Dispense Refill   Ascorbic Acid (VITAMIN C) 100 MG tablet Take 100 mg by mouth daily.     aspirin EC 81 MG tablet Take 1 tablet (81 mg total) by mouth daily. Swallow whole. 100 tablet 1   calcium carbonate (OS-CAL) 600 MG TABS tablet Take by mouth.     cholecalciferol (VITAMIN D3) 25 MCG (1000 UT) tablet Take 1,000 Units by mouth daily.     denosumab (PROLIA) 60 MG/ML SOSY injection Inject  1 mL into the skin every 6 (six) months.     meclizine (ANTIVERT) 12.5 MG tablet Take 1 tablet (12.5 mg total) by mouth 4 (four) times daily as needed for dizziness. 30 tablet 0   mirabegron ER (MYRBETRIQ) 50 MG TB24 tablet Take 1 tablet (50 mg total) by mouth daily. 30 tablet 11   pantoprazole (PROTONIX) 40 MG tablet TAKE 1 TABLET BY MOUTH DAILY 30 tablet 9   polyethylene glycol powder (GLYCOLAX/MIRALAX) 17 GM/SCOOP powder Take 17 g by mouth once. PRN     QUEtiapine (SEROQUEL) 25 MG tablet Take 1 tablet (25 mg total) by mouth at bedtime. 90 tablet 1   rosuvastatin (CRESTOR) 20 MG tablet Take 1 tablet (20 mg total) by mouth every evening. 90 tablet 1   vitamin B-12 (CYANOCOBALAMIN) 500 MCG tablet Take 500 mcg by mouth daily.     No current facility-administered medications for this visit.     Review of Systems    She denies chest pain, palpitations, dyspnea, pnd, orthopnea, n, v, dizziness, syncope, edema, weight gain, or early satiety.  All other systems reviewed and are otherwise negative except as noted above.    Physical Exam    VS:  BP 110/60   Pulse 86   Ht 5\' 6"  (1.676 m)   Wt 191 lb 9.6 oz (86.9 kg)   SpO2 93%   BMI 30.93 kg/m  , BMI Body mass index is 30.93 kg/m.     GEN: Well nourished, well developed, in no acute distress. HEENT: normal. Neck: Supple, no JVD, carotid bruits, or masses. Cardiac: RRR, no murmurs, rubs, or gallops. No clubbing, cyanosis, edema.  Radials 2+/PT 2+ and equal bilaterally.  Respiratory:  Respirations regular and unlabored, clear to auscultation bilaterally. GI: Soft, nontender, nondistended, BS + x 4. MS: no deformity or atrophy. Skin: warm and dry, no rash. Neuro:  Strength and sensation are intact. Psych: Normal affect.  Accessory Clinical Findings    Lab Results  Component Value Date   WBC 3.7 (L) 07/06/2022   HGB 13.1 07/06/2022   HCT 38.8 07/06/2022   MCV 89.0 07/06/2022   PLT 180 07/06/2022   Lab Results  Component Value  Date   CREATININE 0.92 08/05/2022   BUN 14 08/05/2022   NA 140 08/05/2022   K 4.3 08/05/2022   CL 105 08/05/2022   CO2 27 08/05/2022   Lab Results  Component Value Date   ALT 13 08/05/2022   AST 23 08/05/2022   ALKPHOS 48 07/05/2022   BILITOT 0.4 08/05/2022   Lab Results  Component Value Date   CHOL 165 01/04/2022  HDL 66 01/04/2022   LDLCALC 81 01/04/2022   LDLDIRECT 109.0 12/15/2014   TRIG 95 01/04/2022   CHOLHDL 2.5 01/04/2022     Assessment & Plan    1.  Coronary artery disease: Status post PCI drug-eluting stent placement to the mid to distal right coronary artery in April 2024, in the setting of unstable angina.  She has done well over the past 6 months without recurrent chest pain or dyspnea.  She complains of frequent bruising and has completed 6 months of dual antiplatelet therapy.  We will discontinue clopidogrel and continue aspirin.  She otherwise remains on rosuvastatin therapy.  She is due for lipids and prefers to have these performed through her primary care provider's office.  2.  Hyperlipidemia: LDL of 81 in October 2023.  Currently on rosuvastatin 20 mg daily.  Recommended follow-up lipids and LFTs however, she prefers to have these performed through her primary care provider's office as that is more convenient.  3.  History of nonsustained VT: This is per report his prior monitor from outside office unavailable for review.  She denies any palpitations, presyncope, or syncope.  4.  Disposition: Patient will follow-up with PCP regarding lipids and LFTs.  Follow-up in 6 months or sooner if necessary.  Nicolasa Ducking, NP 01/17/2023, 12:22 PM

## 2023-02-06 ENCOUNTER — Encounter: Payer: Self-pay | Admitting: Urology

## 2023-02-06 ENCOUNTER — Ambulatory Visit: Payer: PPO | Admitting: Urology

## 2023-02-06 VITALS — BP 125/84 | HR 65 | Ht 66.0 in

## 2023-02-06 DIAGNOSIS — N3946 Mixed incontinence: Secondary | ICD-10-CM

## 2023-02-06 LAB — URINALYSIS, COMPLETE
Bilirubin, UA: NEGATIVE
Glucose, UA: NEGATIVE
Ketones, UA: NEGATIVE
Nitrite, UA: NEGATIVE
Protein,UA: NEGATIVE
RBC, UA: NEGATIVE
Specific Gravity, UA: 1.02 (ref 1.005–1.030)
Urobilinogen, Ur: 0.2 mg/dL (ref 0.2–1.0)
pH, UA: 5.5 (ref 5.0–7.5)

## 2023-02-06 LAB — MICROSCOPIC EXAMINATION

## 2023-02-06 NOTE — Progress Notes (Signed)
02/06/2023 11:37 AM   Jacqueline Kaiser 12-12-1945 [id number]  Referring provider: Alba Cory, MD [address] [place],  [place] [address]  Chief Complaint  Patient presents with   Follow-up   Urinary Incontinence    HPI: Patient has cystocele repair on June 19, 2018.  She saw our nurse practitioner in May and she was pleased with the repair.  She was having some nocturia and was no longer on Myrbetriq and she did not feel she still needed it.  She was no longer using vaginal estrogen cream and did not feel she needed either medication.   Bladder little bit urgent but otherwise doing well.  No pain.  No incontinence.    On vaginal examination she had very good support anteriorly posteriorly with good vaginal length and no stress incontinence.  She had some poor estrogenization changes near the urethra.   The patient is complaining of urge incontinence and foot on the floor syndrome wearing 1 or 2 damp many pads per day.  She gets up once a night.  She can leak some with coughing sneezing and is seeing her primary care doctor tomorrow for constipation.  She is time voiding trying to stay drier.  She has a lot of urgency especially no the night   When I saw her prior to her surgery she was voiding every 1-2 hours and Myrbetriq was helping some of her urgency.  She still has low back discomfort that is nonspecific.  We had separated urgency goals first prolapse goals at that time.  Her bladder capacity was 320 mL.  She did not have stress incontinence reaching a pressure of 126 cm water.  She had bladder overactivity reaching a pressure of 13 cm of water leaking a mild to moderate amount   Symptoms definitely worse at night in the last month especially   Patient is gradually having more urgency and even urge incontinence. I will call if culture is positive. I gave her Myrbetriq 50 mg samples and prescription. I gave her oxybutynin ER 10 mg recognizing it could worsen her  constipation. We had a little bit of a circular conversation but there is no question she had this prior. Probably also has an element of a nocturnal diuresis    I last saw the patient in 2021 In the last 6 months patient is having a little bit of urge incontinence.  She wears 1 liner that sometimes is damp and sometimes dry.  Clinically not infected.  Leaked a few drops twice with bedwetting.  Voids every 3 hours gets up twice a night.    Reassess in 6 weeks on Myrbetriq 50 mg 30 x 11. Only repeat testing and physical examination if were not reaching her treatment goal. She has mild overactive bladder with mild urge incontinence     Today Frequency stable.  Last culture negative She can still have urge incontinence and Myrbetriq really did not help very much.  She says she definitely for Singh in the morning can have foot on the floor syndrome and very high volume voiding.  No ankle edema.  Clinically not infected but urine sent for culture   PMH: Past Medical History:  Diagnosis Date   Allergy    Ankle fracture 2014   Left   Anxiety    CAD (coronary artery disease)    a. 07/2022 PCI: LM nl, LAD 33m, LCX mild diff dzs, OM1 30, RCA 16m, 29m/d (3.0x12 Onyx Frontier DES). EF 55-65%.  Female cystocele    Grade 2   Fracture    lower back after sneezing   GERD (gastroesophageal reflux disease)    History of   History of vertigo    last attack about 1 week ago   Hyperlipidemia    Insomnia    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   Right, .4 x 1.3 cm, noted on [place] ABD 05/2016, left lobe of the liver with the largest measuring 9.9 mm in diameter [place] ABD 06/2010   Low back sprain    Nonsustained ventricular tachycardia (HCC)    by Holter,  noraml Stress test ECHO perpatient Katrinka Blazing, [place])   Obese    Osteoporosis    Sinusitis    Urinary frequency    Vertigo    Wrist fracture    Left    Surgical History: Past Surgical History:  Procedure Laterality Date   ABDOMINAL HYSTERECTOMY     BREAST BIOPSY Right     negative 10-12 years ago- core   CESAREAN SECTION     COLONOSCOPY  2012   CORONARY STENT INTERVENTION N/A 07/05/2022   Procedure: CORONARY STENT INTERVENTION;  Surgeon: Iran Ouch, MD;  Location: [facility];  Service: Cardiovascular;  Laterality: N/A;   CYSTOCELE REPAIR N/A 06/19/2018   Procedure: CYSTOSCOPY ANTERIOR REPAIR (CYSTOCELE);  Surgeon: Alfredo Martinez, MD;  Location: [facility];  Service: Urology;  Laterality: N/A;   LEFT HEART CATH AND CORONARY ANGIOGRAPHY N/A 07/05/2022   Procedure: LEFT HEART CATH AND CORONARY ANGIOGRAPHY;  Surgeon: Iran Ouch, MD;  Location: [facility];  Service: Cardiovascular;  Laterality: N/A;   TONSILLECTOMY      Home Medications:  Allergies as of 02/06/2023       Reactions   Betadine [povidone Iodine] Other (See Comments)   Burns   Iodine         Medication List        Accurate as of February 06, 2023 11:37 AM. If you have any questions, ask your nurse or doctor.          aspirin EC 81 MG tablet Take 1 tablet (81 mg total) by mouth daily. Swallow whole.   calcium carbonate 600 MG Tabs tablet Commonly known as: OS-CAL Take by mouth.   cholecalciferol 25 MCG (1000 UNIT) tablet Commonly known as: VITAMIN D3 Take 1,000 Units by mouth daily.   cyanocobalamin 500 MCG tablet Commonly known as: VITAMIN B12 Take 500 mcg by mouth daily.   meclizine 12.5 MG tablet Commonly known as: ANTIVERT Take 1 tablet (12.5 mg total) by mouth 4 (four) times daily as needed for dizziness.   mirabegron ER 50 MG Tb24 tablet Commonly known as: MYRBETRIQ Take 1 tablet (50 mg total) by mouth daily.   pantoprazole 40 MG tablet Commonly known as: PROTONIX TAKE 1 TABLET BY MOUTH DAILY   polyethylene glycol powder 17 GM/SCOOP powder Commonly known as: GLYCOLAX/MIRALAX Take 17 g by mouth once. PRN   Prolia 60 MG/ML Sosy injection Generic drug: denosumab Inject 1 mL into the skin every 6 (six) months.   QUEtiapine 25 MG  tablet Commonly known as: SEROquel Take 1 tablet (25 mg total) by mouth at bedtime.   rosuvastatin 20 MG tablet Commonly known as: CRESTOR Take 1 tablet (20 mg total) by mouth every evening.   vitamin C 100 MG tablet Take 100 mg by mouth daily.        Allergies:  Allergies  Allergen Reactions   Betadine [Povidone Iodine] Other (See Comments)  Burns   Iodine     Family History: Family History  Problem Relation Age of Onset   Heart disease Mother        ATRIAL FIB   Cancer Father 73       Lung Ca,  nonsmoker, metastatic at diagnosis  asbestos exposure   Breast cancer Daughter 44       dx at age of 64 and 25    Social History:  reports that she quit smoking about 44 years ago. Her smoking use included cigarettes. She started smoking about 54 years ago. She has a 5 pack-year smoking history. She has been exposed to tobacco smoke. She has never used smokeless tobacco. She reports that she does not currently use alcohol. She reports that she does not use drugs.  ROS:                                        Physical Exam: BP 125/84   Pulse 65   Ht 5\' 6"  (1.676 m)   BMI 30.93 kg/m   Constitutional:  Alert and oriented, No acute distress. HEENT: [place] AT, moist mucus membranes.  Trachea midline, no masses.   Laboratory Data: Lab Results  Component Value Date   WBC 3.7 (L) 07/06/2022   HGB 13.1 07/06/2022   HCT 38.8 07/06/2022   MCV 89.0 07/06/2022   PLT 180 07/06/2022    Lab Results  Component Value Date   CREATININE 0.92 08/05/2022    No results found for: "PSA"  No results found for: "TESTOSTERONE"  No results found for: "HGBA1C"  Urinalysis    Component Value Date/Time   COLORURINE YELLOW 03/22/2016 0957   APPEARANCEUR Clear 12/26/2022 1015   LABSPEC 1.010 03/22/2016 0957   PHURINE 5.0 03/22/2016 0957   GLUCOSEU Negative 12/26/2022 1015   GLUCOSEU NEGATIVE 03/22/2016 0957   HGBUR NEGATIVE 03/22/2016 0957   BILIRUBINUR  Negative 12/26/2022 1015   KETONESUR NEGATIVE 03/22/2016 0957   PROTEINUR Negative 12/26/2022 1015   UROBILINOGEN 0.2 03/22/2016 0957   NITRITE Negative 12/26/2022 1015   NITRITE NEGATIVE 03/22/2016 0957   LEUKOCYTESUR 1+ (A) 12/26/2022 1015    Pertinent Imaging: Urine reviewed and sent for culture  Assessment & Plan: Reassess in 6 weeks on Gemtesa samples and prescription.  Call if culture positive.  Repeat pelvic examination if necessary and or recommend urodynamics and/or cystoscopy in the future depending on goals  1. Mixed incontinence  - Urinalysis, Complete - CULTURE, URINE COMPREHENSIVE   No follow-ups on file.  Martina Sinner, MD  [facility] [address] [place], [place] [address] [phone]

## 2023-02-09 LAB — CULTURE, URINE COMPREHENSIVE

## 2023-02-19 ENCOUNTER — Other Ambulatory Visit: Payer: Self-pay

## 2023-02-19 NOTE — Progress Notes (Signed)
This patient is appearing on a report for being at risk of failing the adherence measure for cholesterol (statin) medications this calendar year.   Medication: rosuvastatin 20 mg PO daily Last fill date: 11/17/22 for 90 day supply  Contacted patient as our records and Dr. Tiajuana Amass displayed last fill 11/17/22. Pt stated that she had a good supply of this medication - fill hx now suggests last fill 02/13/23. No action needed at this time. Patient denied adverse effects.    Nils Pyle, PharmD PGY1 Pharmacy Resident

## 2023-02-27 NOTE — Progress Notes (Unsigned)
Name: Jacqueline Kaiser   MRN: [id number]    DOB: 1945-05-30   Date:02/28/2023       Progress Note  Subjective  Chief Complaint  Follow Up  HPI  Discussed the use of AI scribe software for clinical note transcription with the patient, who gave verbal consent to proceed.  History of Present Illness   The patient, with a history of heart disease and recent discontinuation of Plavix, presented with symptoms of indigestion and heartburn that began during a recent vacation. Despite no significant dietary changes, the patient experienced severe discomfort, leading to early return from the trip. The symptoms were severe enough to cause vomiting. Accompanying the gastrointestinal discomfort, the patient reported feeling 'headachy,' experiencing chills, and a general sense of malaise.  Following the episode, the patient has been feeling persistently fatigued, despite adequate sleep. She described a lack of energy and a general feeling of being 'blah.' The patient denied any shortness of breath or changes in her usual activity level.  The patient attempted to manage the symptoms with antacids and milk, which provided some relief. However, the fatigue and general malaise persisted. The patient also reported increased phlegm production.  The patient's last cardiology follow-up was in October, during which Plavix was discontinued. The patient is currently on rosuvastatin, baby aspirin, pantoprazole, Gemtesa for bladder issues, Prolia for osteoporosis  and Seroquel for sleep. The patient denied any changes in medication regimen apart from the discontinuation of Plavix.         Patient Active Problem List   Diagnosis Date Noted   CAD (coronary artery disease) 07/06/2022   Hyperlipidemia LDL goal <70 07/06/2022   Unstable angina (HCC) 07/05/2022   Stage 3a chronic kidney disease (HCC) 07/04/2022   Coccyalgia 01/11/2021   Cystocele with prolapse 06/19/2018   Age-related osteoporosis without current  pathological fracture 01/08/2018   History of vertebral compression fracture 05/23/2017   Incomplete bladder emptying 05/16/2016   GERD (gastroesophageal reflux disease) 02/14/2016   Insomnia due to anxiety and fear 02/14/2016   Allergy 07/01/2013   Osteoporosis of lumbar spine 05/19/2013   Overweight (BMI 25.0-29.9) 08/28/2011   Peripheral vertigo 08/28/2011   Nonsustained ventricular tachycardia (HCC)    Hyperlipidemia     Past Surgical History:  Procedure Laterality Date   ABDOMINAL HYSTERECTOMY     BREAST BIOPSY Right    negative 10-12 years ago- core   CESAREAN SECTION     COLONOSCOPY  2012   CORONARY STENT INTERVENTION N/A 07/05/2022   Procedure: CORONARY STENT INTERVENTION;  Surgeon: Iran Ouch, MD;  Location: [facility];  Service: Cardiovascular;  Laterality: N/A;   CYSTOCELE REPAIR N/A 06/19/2018   Procedure: CYSTOSCOPY ANTERIOR REPAIR (CYSTOCELE);  Surgeon: Alfredo Martinez, MD;  Location: [facility];  Service: Urology;  Laterality: N/A;   LEFT HEART CATH AND CORONARY ANGIOGRAPHY N/A 07/05/2022   Procedure: LEFT HEART CATH AND CORONARY ANGIOGRAPHY;  Surgeon: Iran Ouch, MD;  Location: [facility];  Service: Cardiovascular;  Laterality: N/A;   TONSILLECTOMY      Family History  Problem Relation Age of Onset   Heart disease Mother        ATRIAL FIB   Cancer Father 48       Lung Ca,  nonsmoker, metastatic at diagnosis  asbestos exposure   Breast cancer Daughter 55       dx at age of 73 and 59    Social History   Tobacco Use   Smoking status: Former  Current packs/day: 0.00    Average packs/day: 0.5 packs/day for 10.0 years (5.0 ttl pk-yrs)    Types: Cigarettes    Start date: 04/04/1968    Quit date: 04/04/1978    Years since quitting: 44.9    Passive exposure: Past   Smokeless tobacco: Never   Tobacco comments:    smoking cessation materials not required  Substance Use Topics   Alcohol use: Not Currently    Comment: wine  occasionally     Current Outpatient Medications:    Ascorbic Acid (VITAMIN C) 100 MG tablet, Take 100 mg by mouth daily., Disp: , Rfl:    aspirin EC 81 MG tablet, Take 1 tablet (81 mg total) by mouth daily. Swallow whole., Disp: 100 tablet, Rfl: 1   calcium carbonate (OS-CAL) 600 MG TABS tablet, Take by mouth., Disp: , Rfl:    cholecalciferol (VITAMIN D3) 25 MCG (1000 UT) tablet, Take 1,000 Units by mouth daily., Disp: , Rfl:    denosumab (PROLIA) 60 MG/ML SOSY injection, Inject 1 mL into the skin every 6 (six) months., Disp: , Rfl:    meclizine (ANTIVERT) 12.5 MG tablet, Take 1 tablet (12.5 mg total) by mouth 4 (four) times daily as needed for dizziness., Disp: 30 tablet, Rfl: 0   pantoprazole (PROTONIX) 40 MG tablet, TAKE 1 TABLET BY MOUTH DAILY, Disp: 30 tablet, Rfl: 9   polyethylene glycol powder (GLYCOLAX/MIRALAX) 17 GM/SCOOP powder, Take 17 g by mouth once. PRN, Disp: , Rfl:    QUEtiapine (SEROQUEL) 25 MG tablet, Take 1 tablet (25 mg total) by mouth at bedtime., Disp: 90 tablet, Rfl: 1   rosuvastatin (CRESTOR) 20 MG tablet, Take 1 tablet (20 mg total) by mouth every evening., Disp: 90 tablet, Rfl: 1   Vibegron (GEMTESA) 75 MG TABS, Take 1 tablet by mouth daily., Disp: , Rfl:    vitamin B-12 (CYANOCOBALAMIN) 500 MCG tablet, Take 500 mcg by mouth daily., Disp: , Rfl:   Allergies  Allergen Reactions   Betadine [Povidone Iodine] Other (See Comments)    Burns   Iodine     I personally reviewed active problem list, medication list, allergies, family history, social history, health maintenance with the patient/caregiver today.   ROS  Ten systems reviewed and is negative except as mentioned in HPI    Objective  Vitals:   02/28/23 0839  BP: 124/70  Pulse: 80  Resp: 16  SpO2: 95%  Weight: 185 lb (83.9 kg)  Height: 5\' 6"  (1.676 m)    Body mass index is 29.86 kg/m.  Physical Exam  Constitutional: Patient appears well-developed and well-nourished.  No distress.  HEENT: head  atraumatic, normocephalic, pupils equal and reactive to light, neck supple Cardiovascular: Normal rate, regular rhythm and normal heart sounds.  No murmur heard. No BLE edema. Pulmonary/Chest: Effort normal and breath sounds normal. No respiratory distress. Abdominal: Soft.  There is no tenderness. Psychiatric: Patient has a normal mood and affect. behavior is normal. Judgment and thought content normal.    PHQ2/9:    02/28/2023    8:38 AM 01/16/2023    9:54 AM 08/26/2022    3:07 PM 08/05/2022    2:36 PM 07/18/2022   11:12 AM  Depression screen PHQ 2/9  Decreased Interest 0 0 0 0 0  Down, Depressed, Hopeless 0 0 0 0 0  PHQ - 2 Score 0 0 0 0 0  Altered sleeping 0 0  0 0  Tired, decreased energy 0 0  0 0  Change in appetite 0  0  0 0  Feeling bad or failure about yourself  0 0  0 0  Trouble concentrating 0 0  0 0  Moving slowly or fidgety/restless 0 0  0 0  Suicidal thoughts 0 0  0 0  PHQ-9 Score 0 0  0 0  Difficult doing work/chores    Not difficult at all     phq 9 is negative   Fall Risk:    02/28/2023    8:38 AM 01/16/2023    9:54 AM 08/26/2022    3:07 PM 08/05/2022    2:36 PM 07/18/2022   11:11 AM  Fall Risk   Falls in the past year? 0 0 0 0 0  Number falls in past yr: 0  0 0 0  Injury with Fall? 0  0 0 0  Risk for fall due to : No Fall Risks No Fall Risks No Fall Risks No Fall Risks No Fall Risks  Follow up Falls prevention discussed Falls prevention discussed Falls evaluation completed Falls prevention discussed;Education provided;Falls evaluation completed Falls prevention discussed      Functional Status Survey: Is the patient deaf or have difficulty hearing?: No Does the patient have difficulty seeing, even when wearing glasses/contacts?: No Does the patient have difficulty concentrating, remembering, or making decisions?: No Does the patient have difficulty walking or climbing stairs?: No Does the patient have difficulty dressing or bathing?: No Does the  patient have difficulty doing errands alone such as visiting a doctor's office or shopping?: No    Assessment & Plan  Assessment and Plan    Possible Acute Coronary Syndrome Recent history of indigestion, diaphoresis, and fatigue. No chest pain or shortness of breath. History of stent placement in mid RCA in April. Stopped Plavix in October. EKG showed flat T wave on V2, V3, mildly inverted in V1. No Q wave or ST elevation noted. -Order troponin, CK, cholesterol, and kidney function tests. -Refer to cardiology within the next week regardless of test results. -Consider further cardiac workup such as catheterization  based on cardiology assessment.  Gastroesophageal Reflux Disease Reports of indigestion and heartburn, particularly during recent vacation. Currently taking pantoprazole and Tums with some relief. -Continue pantoprazole and Tums as needed.  Chronic Fatigue Reports of feeling tired and lacking energy, despite adequate sleep. No shortness of breath or change in exercise tolerance. -Monitor and reassess after cardiology evaluation.  Chronic Kidney Disease Stage 3A No recent labs. -Check kidney function with other ordered labs.  General Health Maintenance -Continue current medications including rosuvastatin 20mg , baby aspirin, Gemtesa, Prolia, and Seroquel. -Continue Pantoprazole for GERD symptoms.

## 2023-02-28 ENCOUNTER — Ambulatory Visit (INDEPENDENT_AMBULATORY_CARE_PROVIDER_SITE_OTHER): Payer: PPO | Admitting: Family Medicine

## 2023-02-28 ENCOUNTER — Ambulatory Visit: Payer: PPO | Attending: Student | Admitting: Student

## 2023-02-28 ENCOUNTER — Encounter: Payer: Self-pay | Admitting: Student

## 2023-02-28 ENCOUNTER — Encounter: Payer: Self-pay | Admitting: Family Medicine

## 2023-02-28 VITALS — BP 124/70 | HR 80 | Resp 16 | Ht 66.0 in | Wt 185.0 lb

## 2023-02-28 VITALS — BP 109/64 | HR 63 | Ht 66.0 in | Wt 183.2 lb

## 2023-02-28 DIAGNOSIS — E785 Hyperlipidemia, unspecified: Secondary | ICD-10-CM | POA: Diagnosis not present

## 2023-02-28 DIAGNOSIS — I25118 Atherosclerotic heart disease of native coronary artery with other forms of angina pectoris: Secondary | ICD-10-CM | POA: Diagnosis not present

## 2023-02-28 DIAGNOSIS — K30 Functional dyspepsia: Secondary | ICD-10-CM

## 2023-02-28 DIAGNOSIS — Z95820 Peripheral vascular angioplasty status with implants and grafts: Secondary | ICD-10-CM

## 2023-02-28 DIAGNOSIS — N1831 Chronic kidney disease, stage 3a: Secondary | ICD-10-CM | POA: Diagnosis not present

## 2023-02-28 DIAGNOSIS — R5383 Other fatigue: Secondary | ICD-10-CM | POA: Diagnosis not present

## 2023-02-28 LAB — CK TOTAL AND CKMB (NOT AT ARMC)
CK, MB: <0.7 ng/mL (ref 0–5.0)
Total CK: 65 U/L (ref 29–143)

## 2023-02-28 LAB — TROPONIN I: Troponin I: 5 ng/L (ref <=47)

## 2023-02-28 NOTE — H&P (View-Only) (Signed)
 Cardiology Clinic Note   Date: 02/28/2023 ID: Belem, Burrough 08/16/1945, MRN [id number]  Primary Cardiologist:  Sherryl Manges, MD  Patient Profile    Jacqueline Kaiser is a 77 y.o. female who presents to the clinic today for evaluation of chest discomfort.     Past medical history significant for: CAD. LHC 07/05/2022: OM1 30%.  Mid LAD 30%.  Mid to distal RCA 80%.  Mid RCA 30%.  PCI with DES to mid to distal RCA.  Mildly elevated LVEDP.  Normal LV systolic function. NSVT. Per prior heart monitor from outside office unavailable for review. Hyperlipidemia. Lipid panel 01/04/2022: LDL 81, HDL 66, TG 95, total 165. GERD. CKD stage IIIa.  In summary, patient establish care with Dr. Graciela Husbands in April 2024 due to accelerating angina.  She was admitted from the office for LHC which revealed significant one-vessel CAD for which she underwent PCI with DES as above.  She otherwise had moderate diffuse CAD.  EF 55 to 65% by ventriculography.     History of Present Illness    Jacqueline Kaiser is followed by Dr. Graciela Husbands for the above outlined history.   Patient was last seen in the office by Ward Givens, NP on 01/17/2023 for routine follow-up.  She was doing well at that time without complaints of recurrent chest pain or dyspnea.  She complained of frequent bruising.  Plavix was discontinued as she completed 6 months of DAPT.  Patient was seen by PCP today for indigestion. She reported experiencing severe discomfort during a recent vacation with indigestion/heartburn symptoms along with vomiting, GI discomfort, chills, general malaise, and feeling headachy. She denies significant dietary changes. Since that time she reported feeling persistently fatigued with lack of energy. No sleep disturbance. Symptoms relieved with antiacids and milk. PCP concerned symptoms are cardiac related and recommended cardiology follow up. Troponin was drawn by PCP.   Today, patient presents accompanied by her husband for  evaluation of chest discomfort that began one week ago. Patient reports symptoms as above have been waxing and waning for 1 week. Pain is central chest more to the left and straight through the back described as pressure lasting minutes and resolving on its own only to return again. She has had as many as 4-5 episodes in a day but has not gone a day without at least a couple of episodes of discomfort. She reports minimal relief with antiacids. This pain is the same pain she had prior to stent placement in April "just not as intense." She also reports associated headache and feeling winded. Pain is not necessarily associated with exertion. She was able to perform yard work all day yesterday without increased episodes. She denies lower extremity edema, orthopnea, PND, or palpitations.       ROS: All other systems reviewed and are otherwise negative except as noted in History of Present Illness.  Studies Reviewed    EKG Interpretation Date/Time:  Tuesday February 28 2023 14:49:17 EST Ventricular Rate:  63 PR Interval:  130 QRS Duration:  88 QT Interval:  414 QTC Calculation: 423 R Axis:   19  Text Interpretation: Normal sinus rhythm Nonspecific T wave abnormality When compared with ECG of 06-Jul-2022 04:34, No significant change from previous Confirmed by Carlos Levering [phone]) on 02/28/2023 2:52:59 PM           Physical Exam    VS:  BP 109/64 (BP Location: Left Arm, Patient Position: Sitting, Cuff Size: Normal)   Pulse 63   Ht  5\' 6"  (1.676 m)   Wt 183 lb 3.2 oz (83.1 kg)   SpO2 95%   BMI 29.57 kg/m  , BMI Body mass index is 29.57 kg/m.  GEN: Well nourished, well developed, in no acute distress. Neck: No JVD or carotid bruits. Cardiac:  RRR. No murmurs. No rubs or gallops.   Respiratory:  Respirations regular and unlabored. Clear to auscultation without rales, wheezing or rhonchi. GI: Soft, nontender, nondistended. Extremities: Radials/DP/PT 2+ and equal bilaterally. No  clubbing or cyanosis. No edema.  Skin: Warm and dry, no rash. Neuro: Strength intact.  Assessment & Plan   CAD S/p PCI with DES to RCA April 2024. Patient was changed to monotherapy with aspirin after completing DAPT in October 2024. Patient reports anginal equivalent chest discomfort described as pressure central chest straight through to back "like a vice" that comes and goes lasting minutes and not necessarily associated with exertion. Patient understands if she is contacted by PCP for positive troponin she is to go to ED. No acute changes on EKG.  -Given anginal equivalent chest pain, will schedule LHC.  -Provided ED precautions.  -Continue aspirin, rosuvastatin.  -Labs drawn by PCP today.   Hyperlipidemia LDL October 2023 81, not at goal. Repeat lipid panel from PCP is pending.  -Continue rosuvastatin.       Disposition: LHC with Dr. Okey Dupre on 11/29. Return in 2 weeks following cath.   Informed Consent   Shared Decision Making/Informed Consent The risks [stroke (1 in 1000), death (1 in 1000), kidney failure [usually temporary] (1 in 500), bleeding (1 in 200), allergic reaction [possibly serious] (1 in 200)], benefits (diagnostic support and management of coronary artery disease) and alternatives of a cardiac catheterization were discussed in detail with Jacqueline Kaiser and she is willing to proceed.             Signed, Etta Grandchild. Cheris Tweten, DNP, NP-C

## 2023-02-28 NOTE — Patient Instructions (Addendum)
Medication Instructions:  Your Physician recommend you continue on your current medication as directed.    *If you need a refill on your cardiac medications before your next appointment, please call your pharmacy*   Lab Work: None ordered at this time  If you have labs (blood work) drawn today and your tests are completely normal, you will receive your results only by: MyChart Message (if you have MyChart) OR A paper copy in the mail If you have any lab test that is abnormal or we need to change your treatment, we will call you to review the results.   Testing/Procedures:  [place] [facility] DEPT OF [facility] AT [place] [address] [place] [address] Dept: [phone] Loc: [phone]  Jacqueline Kaiser  02/28/2023  You are scheduled for a Cardiac Catheterization on Friday, November 29 with Dr. Cristal De[place] End.  1. Please arrive at the [facility] Entrance of [facility], [address], [place] [address] at 6:30 AM (This is 1 hour prior to your procedure time).  Proceed to the Check-In Desk directly inside the entrance.  Procedure Parking: Use the entrance off of the [address] side of the hospital. Turn right upon entering and follow the driveway to parking that is directly in front of the [facility] Center. There is no valet parking available at this entrance, however there is an awning directly in front of the [facility] Vascular Center for drop off/ pick up for patients.  Special note: Every effort is made to have your procedure done on time. Please understand that emergencies sometimes delay scheduled procedures.  2. Diet: Do not eat solid foods after midnight.  The patient may have clear liquids until 5am upon the day of the procedure.  3. Labs: You had blood drawn today.  4. Medication instructions in preparation for your procedure:   Contrast Allergy: No  On the morning  of your procedure, take your Aspirin 81 mg.  You may use sips of water.  5. Plan to go home the same day, you will only stay overnight if medically necessary. 6. Bring a current list of your medications and current insurance cards. 7. You MUST have a responsible person to drive you home. 8. Someone MUST be with you the first 24 hours after you arrive home or your discharge will be delayed. 9. Please wear clothes that are easy to get on and off and wear slip-on shoes.  Thank you for allowing [place] to care for you!   -- [facility]    Follow-Up: At [facility], you and your health needs are our priority.  As part of our continuing mission to provide you with exceptional heart care, we have created designated Provider Care Teams.  These Care Teams include your primary Cardiologist (physician) and Advanced Practice Providers (APPs -  Physician Assistants and Nurse Practitioners) who all work together to provide you with the care you need, when you need it.  We recommend signing up for the patient portal called "MyChart".  Sign up information is provided on this After Visit Summary.  MyChart is used to connect with patients for Virtual Visits (Telemedicine).  Patients are able to view lab/test results, encounter notes, upcoming appointments, etc.  Non-urgent messages can be sent to your provider as well.   To learn more about what you can do with MyChart, go to [email or web address].    Your next  appointment:   2 week(s) after 29 November  Provider:   You may see Carlos Levering, NP

## 2023-02-28 NOTE — Progress Notes (Signed)
Cardiology Clinic Note   Date: 02/28/2023 ID: Belem, Burrough 08/16/1945, MRN [id number]  Primary Cardiologist:  Sherryl Manges, MD  Patient Profile    GENNAVIEVE LAPOINT is a 77 y.o. female who presents to the clinic today for evaluation of chest discomfort.     Past medical history significant for: CAD. LHC 07/05/2022: OM1 30%.  Mid LAD 30%.  Mid to distal RCA 80%.  Mid RCA 30%.  PCI with DES to mid to distal RCA.  Mildly elevated LVEDP.  Normal LV systolic function. NSVT. Per prior heart monitor from outside office unavailable for review. Hyperlipidemia. Lipid panel 01/04/2022: LDL 81, HDL 66, TG 95, total 165. GERD. CKD stage IIIa.  In summary, patient establish care with Dr. Graciela Husbands in April 2024 due to accelerating angina.  She was admitted from the office for LHC which revealed significant one-vessel CAD for which she underwent PCI with DES as above.  She otherwise had moderate diffuse CAD.  EF 55 to 65% by ventriculography.     History of Present Illness    THELLA MCCLAINE is followed by Dr. Graciela Husbands for the above outlined history.   Patient was last seen in the office by Ward Givens, NP on 01/17/2023 for routine follow-up.  She was doing well at that time without complaints of recurrent chest pain or dyspnea.  She complained of frequent bruising.  Plavix was discontinued as she completed 6 months of DAPT.  Patient was seen by PCP today for indigestion. She reported experiencing severe discomfort during a recent vacation with indigestion/heartburn symptoms along with vomiting, GI discomfort, chills, general malaise, and feeling headachy. She denies significant dietary changes. Since that time she reported feeling persistently fatigued with lack of energy. No sleep disturbance. Symptoms relieved with antiacids and milk. PCP concerned symptoms are cardiac related and recommended cardiology follow up. Troponin was drawn by PCP.   Today, patient presents accompanied by her husband for  evaluation of chest discomfort that began one week ago. Patient reports symptoms as above have been waxing and waning for 1 week. Pain is central chest more to the left and straight through the back described as pressure lasting minutes and resolving on its own only to return again. She has had as many as 4-5 episodes in a day but has not gone a day without at least a couple of episodes of discomfort. She reports minimal relief with antiacids. This pain is the same pain she had prior to stent placement in April "just not as intense." She also reports associated headache and feeling winded. Pain is not necessarily associated with exertion. She was able to perform yard work all day yesterday without increased episodes. She denies lower extremity edema, orthopnea, PND, or palpitations.       ROS: All other systems reviewed and are otherwise negative except as noted in History of Present Illness.  Studies Reviewed    EKG Interpretation Date/Time:  Tuesday February 28 2023 14:49:17 EST Ventricular Rate:  63 PR Interval:  130 QRS Duration:  88 QT Interval:  414 QTC Calculation: 423 R Axis:   19  Text Interpretation: Normal sinus rhythm Nonspecific T wave abnormality When compared with ECG of 06-Jul-2022 04:34, No significant change from previous Confirmed by Carlos Levering [phone]) on 02/28/2023 2:52:59 PM           Physical Exam    VS:  BP 109/64 (BP Location: Left Arm, Patient Position: Sitting, Cuff Size: Normal)   Pulse 63   Ht  5\' 6"  (1.676 m)   Wt 183 lb 3.2 oz (83.1 kg)   SpO2 95%   BMI 29.57 kg/m  , BMI Body mass index is 29.57 kg/m.  GEN: Well nourished, well developed, in no acute distress. Neck: No JVD or carotid bruits. Cardiac:  RRR. No murmurs. No rubs or gallops.   Respiratory:  Respirations regular and unlabored. Clear to auscultation without rales, wheezing or rhonchi. GI: Soft, nontender, nondistended. Extremities: Radials/DP/PT 2+ and equal bilaterally. No  clubbing or cyanosis. No edema.  Skin: Warm and dry, no rash. Neuro: Strength intact.  Assessment & Plan   CAD S/p PCI with DES to RCA April 2024. Patient was changed to monotherapy with aspirin after completing DAPT in October 2024. Patient reports anginal equivalent chest discomfort described as pressure central chest straight through to back "like a vice" that comes and goes lasting minutes and not necessarily associated with exertion. Patient understands if she is contacted by PCP for positive troponin she is to go to ED. No acute changes on EKG.  -Given anginal equivalent chest pain, will schedule LHC.  -Provided ED precautions.  -Continue aspirin, rosuvastatin.  -Labs drawn by PCP today.   Hyperlipidemia LDL October 2023 81, not at goal. Repeat lipid panel from PCP is pending.  -Continue rosuvastatin.       Disposition: LHC with Dr. Okey Dupre on 11/29. Return in 2 weeks following cath.   Informed Consent   Shared Decision Making/Informed Consent The risks [stroke (1 in 1000), death (1 in 1000), kidney failure [usually temporary] (1 in 500), bleeding (1 in 200), allergic reaction [possibly serious] (1 in 200)], benefits (diagnostic support and management of coronary artery disease) and alternatives of a cardiac catheterization were discussed in detail with Ms. O'Brien and she is willing to proceed.             Signed, Etta Grandchild. Cheris Tweten, DNP, NP-C

## 2023-03-01 LAB — LIPID PANEL
Cholesterol: 160 mg/dL (ref <200)
HDL: 66 mg/dL (ref >=50)
LDL Cholesterol (Calc): 76 mg/dL
Non-HDL Cholesterol (Calc): 94 mg/dL (ref <130)
Total CHOL/HDL Ratio: 2.4 (calc) (ref <5.0)
Triglycerides: 101 mg/dL (ref <150)

## 2023-03-01 LAB — COMPLETE METABOLIC PANEL WITH GFR
AG Ratio: 2 (calc) (ref 1.0–2.5)
ALT: 12 U/L (ref 6–29)
AST: 19 U/L (ref 10–35)
Albumin: 4.3 g/dL (ref 3.6–5.1)
Alkaline phosphatase (APISO): 49 U/L (ref 37–153)
BUN: 20 mg/dL (ref 7–25)
CO2: 29 mmol/L (ref 20–32)
Calcium: 9.6 mg/dL (ref 8.6–10.4)
Chloride: 103 mmol/L (ref 98–110)
Creat: 0.92 mg/dL (ref 0.60–1.00)
Globulin: 2.2 g/dL (ref 1.9–3.7)
Glucose, Bld: 102 mg/dL — ABNORMAL HIGH (ref 65–99)
Potassium: 5.2 mmol/L (ref 3.5–5.3)
Sodium: 142 mmol/L (ref 135–146)
Total Bilirubin: 0.4 mg/dL (ref 0.2–1.2)
Total Protein: 6.5 g/dL (ref 6.1–8.1)
eGFR: 63 mL/min/{1.73_m2} (ref >=60)

## 2023-03-01 LAB — CBC WITH DIFFERENTIAL/PLATELET
Absolute Lymphocytes: 1352 {cells}/uL (ref 850–3900)
Absolute Monocytes: 365 {cells}/uL (ref 200–950)
Basophils Absolute: 29 {cells}/uL (ref 0–200)
Basophils Relative: 0.7 %
Eosinophils Absolute: 88 {cells}/uL (ref 15–500)
Eosinophils Relative: 2.1 %
HCT: 41.5 % (ref 35.0–45.0)
Hemoglobin: 13.7 g/dL (ref 11.7–15.5)
MCH: 30.8 pg (ref 27.0–33.0)
MCHC: 33 g/dL (ref 32.0–36.0)
MCV: 93.3 fL (ref 80.0–100.0)
MPV: 9.8 fL (ref 7.5–12.5)
Monocytes Relative: 8.7 %
Neutro Abs: 2365 {cells}/uL (ref 1500–7800)
Neutrophils Relative %: 56.3 %
Platelets: 218 10*3/uL (ref 140–400)
RBC: 4.45 10*6/uL (ref 3.80–5.10)
RDW: 12 % (ref 11.0–15.0)
Total Lymphocyte: 32.2 %
WBC: 4.2 10*3/uL (ref 3.8–10.8)

## 2023-03-03 ENCOUNTER — Encounter: Payer: Self-pay | Admitting: Internal Medicine

## 2023-03-03 ENCOUNTER — Encounter
Admission: RE | Disposition: A | Discharge status: Home / Self Care | Payer: Self-pay | Source: Home / Self Care | Attending: Internal Medicine

## 2023-03-03 ENCOUNTER — Other Ambulatory Visit: Payer: Self-pay

## 2023-03-03 ENCOUNTER — Ambulatory Visit
Admission: RE | Admit: 2023-03-03 | Discharge: 2023-03-03 | Disposition: A | Discharge status: Home / Self Care | Payer: PPO | Attending: Internal Medicine | Admitting: Internal Medicine

## 2023-03-03 DIAGNOSIS — Z79899 Other long term (current) drug therapy: Secondary | ICD-10-CM | POA: Diagnosis not present

## 2023-03-03 DIAGNOSIS — I200 Unstable angina: Secondary | ICD-10-CM

## 2023-03-03 DIAGNOSIS — Z955 Presence of coronary angioplasty implant and graft: Secondary | ICD-10-CM | POA: Insufficient documentation

## 2023-03-03 DIAGNOSIS — E785 Hyperlipidemia, unspecified: Secondary | ICD-10-CM | POA: Insufficient documentation

## 2023-03-03 DIAGNOSIS — Z01810 Encounter for preprocedural cardiovascular examination: Secondary | ICD-10-CM | POA: Diagnosis not present

## 2023-03-03 DIAGNOSIS — I2584 Coronary atherosclerosis due to calcified coronary lesion: Secondary | ICD-10-CM | POA: Diagnosis not present

## 2023-03-03 DIAGNOSIS — I25118 Atherosclerotic heart disease of native coronary artery with other forms of angina pectoris: Secondary | ICD-10-CM

## 2023-03-03 DIAGNOSIS — I25110 Atherosclerotic heart disease of native coronary artery with unstable angina pectoris: Secondary | ICD-10-CM | POA: Insufficient documentation

## 2023-03-03 DIAGNOSIS — K219 Gastro-esophageal reflux disease without esophagitis: Secondary | ICD-10-CM | POA: Diagnosis not present

## 2023-03-03 HISTORY — PX: CORONARY PRESSURE/FFR STUDY: CATH118243

## 2023-03-03 HISTORY — PX: LEFT HEART CATH AND CORONARY ANGIOGRAPHY: CATH118249

## 2023-03-03 LAB — POCT ACTIVATED CLOTTING TIME: Activated Clotting Time: 268 s

## 2023-03-03 SURGERY — LEFT HEART CATH AND CORONARY ANGIOGRAPHY
Anesthesia: Moderate Sedation

## 2023-03-03 MED ORDER — HEPARIN SODIUM (PORCINE) 1000 UNIT/ML IJ SOLN
INTRAMUSCULAR | Status: AC
Start: 2023-03-03 — End: ?
  Filled 2023-03-03: qty 10

## 2023-03-03 MED ORDER — LIDOCAINE HCL 1 % IJ SOLN
INTRAMUSCULAR | Status: DC | PRN
  Administered 2023-03-03: 5 mL

## 2023-03-03 MED ORDER — HEPARIN SODIUM (PORCINE) 1000 UNIT/ML IJ SOLN
INTRAMUSCULAR | Status: DC | PRN
  Administered 2023-03-03 (×2): 4500 [IU] via INTRAVENOUS

## 2023-03-03 MED ORDER — DIPHENHYDRAMINE HCL 50 MG/ML IJ SOLN
INTRAMUSCULAR | Status: AC
Start: 2023-03-03 — End: ?
  Filled 2023-03-03: qty 1

## 2023-03-03 MED ORDER — HYDRALAZINE HCL 20 MG/ML IJ SOLN: 10.0000 mg | INTRAMUSCULAR | Status: DC | PRN

## 2023-03-03 MED ORDER — ONDANSETRON HCL 4 MG/2ML IJ SOLN: 4.0000 mg | Freq: Four times a day (QID) | INTRAMUSCULAR | Status: DC | PRN

## 2023-03-03 MED ORDER — ACETAMINOPHEN 325 MG PO TABS: 650.0000 mg | ORAL_TABLET | ORAL | Status: DC | PRN

## 2023-03-03 MED ORDER — NITROGLYCERIN 1 MG/10 ML FOR IR/CATH LAB
INTRA_ARTERIAL | Status: AC
  Filled 2023-03-03: qty 10

## 2023-03-03 MED ORDER — MIDAZOLAM HCL 2 MG/2ML IJ SOLN
INTRAMUSCULAR | Status: DC | PRN
  Administered 2023-03-03: 1 mg via INTRAVENOUS

## 2023-03-03 MED ORDER — ASPIRIN 81 MG PO CHEW: 81.0000 mg | CHEWABLE_TABLET | ORAL | Status: DC

## 2023-03-03 MED ORDER — HEPARIN (PORCINE) IN NACL 1000-0.9 UT/500ML-% IV SOLN
INTRAVENOUS | Status: DC | PRN
  Administered 2023-03-03: 1000 mL

## 2023-03-03 MED ORDER — METHYLPREDNISOLONE SODIUM SUCC 125 MG IJ SOLR
125.0000 mg | Freq: Once | INTRAMUSCULAR | Status: AC
  Administered 2023-03-03: 125 mg via INTRAVENOUS

## 2023-03-03 MED ORDER — DIPHENHYDRAMINE HCL 50 MG/ML IJ SOLN
50.0000 mg | Freq: Once | INTRAMUSCULAR | Status: AC
  Administered 2023-03-03: 50 mg via INTRAVENOUS

## 2023-03-03 MED ORDER — SODIUM CHLORIDE 0.9 % WEIGHT BASED INFUSION: 1.0000 mL/kg/h | INTRAVENOUS | Status: DC

## 2023-03-03 MED ORDER — HEPARIN (PORCINE) IN NACL 1000-0.9 UT/500ML-% IV SOLN
INTRAVENOUS | Status: AC
  Filled 2023-03-03: qty 1000

## 2023-03-03 MED ORDER — VERAPAMIL HCL 2.5 MG/ML IV SOLN
INTRAVENOUS | Status: AC
  Filled 2023-03-03: qty 2

## 2023-03-03 MED ORDER — METHYLPREDNISOLONE SODIUM SUCC 125 MG IJ SOLR
INTRAMUSCULAR | Status: AC
Start: 2023-03-03 — End: ?
  Filled 2023-03-03: qty 2

## 2023-03-03 MED ORDER — NITROGLYCERIN 1 MG/10 ML FOR IR/CATH LAB
INTRA_ARTERIAL | Status: DC | PRN
  Administered 2023-03-03: 200 ug via INTRA_ARTERIAL

## 2023-03-03 MED ORDER — SODIUM CHLORIDE 0.9 % IV SOLN: INTRAVENOUS | Status: DC

## 2023-03-03 MED ORDER — VERAPAMIL HCL 2.5 MG/ML IV SOLN
INTRAVENOUS | Status: DC | PRN
  Administered 2023-03-03 (×2): 2.5 mg via INTRA_ARTERIAL

## 2023-03-03 MED ORDER — SODIUM CHLORIDE 0.9 % WEIGHT BASED INFUSION
3.0000 mL/kg/h | INTRAVENOUS | Status: AC
  Administered 2023-03-03: 3 mL/kg/h via INTRAVENOUS

## 2023-03-03 MED ORDER — RANOLAZINE ER 500 MG PO TB12: 500.0000 mg | ORAL_TABLET | Freq: Two times a day (BID) | ORAL | 5 refills | Status: DC

## 2023-03-03 MED ORDER — IOHEXOL 300 MG/ML  SOLN
INTRAMUSCULAR | Status: DC | PRN
  Administered 2023-03-03: 60 mL

## 2023-03-03 MED ORDER — SODIUM CHLORIDE 0.9% FLUSH: 3.0000 mL | Freq: Two times a day (BID) | INTRAVENOUS | Status: DC

## 2023-03-03 MED ORDER — SODIUM CHLORIDE 0.9% FLUSH: 3.0000 mL | INTRAVENOUS | Status: DC | PRN

## 2023-03-03 MED ORDER — FENTANYL CITRATE (PF) 100 MCG/2ML IJ SOLN
INTRAMUSCULAR | Status: DC | PRN
  Administered 2023-03-03: 12.5 ug via INTRAVENOUS

## 2023-03-03 MED ORDER — SODIUM CHLORIDE 0.9 % IV SOLN: 250.0000 mL | INTRAVENOUS | Status: DC | PRN

## 2023-03-03 MED ORDER — MIDAZOLAM HCL 2 MG/2ML IJ SOLN
INTRAMUSCULAR | Status: AC
  Filled 2023-03-03: qty 2

## 2023-03-03 MED ORDER — FENTANYL CITRATE (PF) 100 MCG/2ML IJ SOLN
INTRAMUSCULAR | Status: AC
  Filled 2023-03-03: qty 2

## 2023-03-03 MED ORDER — LABETALOL HCL 5 MG/ML IV SOLN: 10.0000 mg | INTRAVENOUS | Status: DC | PRN

## 2023-03-03 SURGICAL SUPPLY — 14 items
CATH 5FR JL3.5 JR4 ANG PIG MP (CATHETERS) IMPLANT
CATH LAUNCHER 6FR EBU3.5 (CATHETERS) IMPLANT
DEVICE RAD TR BAND REGULAR (VASCULAR PRODUCTS) IMPLANT
DRAPE BRACHIAL (DRAPES) IMPLANT
GLIDESHEATH SLEND SS 6F .021 (SHEATH) IMPLANT
GUIDEWIRE INQWIRE 1.5J.035X260 (WIRE) IMPLANT
GUIDEWIRE PRESSURE X 175 (WIRE) IMPLANT
INQWIRE 1.5J .035X260CM (WIRE) ×4
KIT ESSENTIALS PG (KITS) IMPLANT
KIT SYRINGE INJ CVI SPIKEX1 (MISCELLANEOUS) IMPLANT
PACK CARDIAC CATH (CUSTOM PROCEDURE TRAY) ×2 IMPLANT
PROTECTION STATION PRESSURIZED (MISCELLANEOUS) ×2
SET ATX-X65L (MISCELLANEOUS) IMPLANT
STATION PROTECTION PRESSURIZED (MISCELLANEOUS) IMPLANT

## 2023-03-03 NOTE — Discharge Instructions (Signed)
Radial Site Care Refer to this sheet in the next few weeks. These instructions provide you with information about caring for yourself after your procedure. Your health care provider may also give you more specific instructions. Your treatment has been planned according to current medical practices, but problems sometimes occur. Call your health care provider if you have any problems or questions after your procedure. What can I expect after the procedure? After your procedure, it is typical to have the following: Bruising at the radial site that usually fades within 1-2 weeks. Blood collecting in the tissue (hematoma) that may be painful to the touch. It should usually decrease in size and tenderness within 1-2 weeks.  Follow these instructions at home: Take medicines only as directed by your health care provider. If you are on a medication called Metformin please do not take for 48 hours after your procedure. Over the next 48hrs please increase your fluid intake of water and non caffeine beverages to flush the contrast dye out of your system.  You may shower 24 hours after the procedure  Leave your bandage on and gently wash the site with plain soap and water. Pat the area dry with a clean towel. Do not rub the site, because this may cause bleeding.  Remove your dressing 48hrs after your procedure and leave open to air.  Do not submerge your site in water for 7 days. This includes swimming and washing dishes.  Check your insertion site every day for redness, swelling, or drainage. Do not apply powder or lotion to the site. Do not flex or bend the affected arm for 24 hours or as directed by your health care provider. Do not push or pull heavy objects with the affected arm for 24 hours or as directed by your health care provider. Do not lift over 10 lb (4.5 kg) for 5 days after your procedure or as directed by your health care provider. Ask your health care provider when it is okay to: Return to  work or school. Resume usual physical activities or sports. Resume sexual activity. Do not drive home if you are discharged the same day as the procedure. Have someone else drive you. You may drive 48 hours after the procedure Do not operate machinery or power tools for 24 hours after the procedure. If your procedure was done as an outpatient procedure, which means that you went home the same day as your procedure, a responsible adult should be with you for the first 24 hours after you arrive home. Keep all follow-up visits as directed by your health care provider. This is important. Contact a health care provider if: You have a fever. You have chills. You have increased bleeding from the radial site. Hold pressure on the site. Get help right away if: You have unusual pain at the radial site. You have redness, warmth, or swelling at the radial site. You have drainage (other than a small amount of blood on the dressing) from the radial site. The radial site is bleeding, and the bleeding does not stop after 15 minutes of holding steady pressure on the site. Your arm or hand becomes pale, cool, tingly, or numb. This information is not intended to replace advice given to you by your health care provider. Make sure you discuss any questions you have with your health care provider. Document Released: 04/23/2010 Document Revised: 08/27/2015 Document Reviewed: 10/07/2013 Elsevier Interactive Patient Education  2018 Elsevier Inc.  

## 2023-03-03 NOTE — Interval H&P Note (Signed)
History and Physical Interval Note:  03/03/2023 7:41 AM  Jacqueline Kaiser  has presented today for surgery, with the diagnosis of unstable angina.  The various methods of treatment have been discussed with the patient and family. After consideration of risks, benefits and other options for treatment, the patient has consented to  Procedure(s): LEFT HEART CATH AND CORONARY ANGIOGRAPHY (Left) as a surgical intervention.  The patient's history has been reviewed, patient examined, no change in status, stable for surgery.  I have reviewed the patient's chart and labs.  Questions were answered to the patient's satisfaction.    Cath Lab Visit (complete for each Cath Lab visit)  Clinical Evaluation Leading to the Procedure:   ACS: No.  Non-ACS:    Anginal Classification: CCS IV  Anti-ischemic medical therapy: No Therapy  Non-Invasive Test Results: No non-invasive testing performed  Prior CABG: No previous CABG  Johncarlo Maalouf

## 2023-03-06 ENCOUNTER — Encounter: Payer: Self-pay | Admitting: Internal Medicine

## 2023-03-08 ENCOUNTER — Encounter: Payer: Self-pay | Admitting: Internal Medicine

## 2023-03-14 NOTE — Progress Notes (Signed)
Cardiology Clinic Note   Date: 03/14/2023 ID: [name], [name] 03/05/1946, MRN [id number]  Primary Cardiologist:  Sherryl Manges, MD  Patient Profile    Jacqueline Kaiser is a 77 y.o. female who presents to the clinic today for heart cath follow-up    Past medical history significant for: CAD. LHC 07/05/2022: OM1 30%.  Mid LAD 30%.  Mid to distal RCA 80%.  Mid RCA 30%.  PCI with DES to mid to distal RCA.  Mildly elevated LVEDP.  Normal LV systolic function. LHC 03/03/2023: Moderate two-vessel CAD.  Mid LAD 50%.  OM2 60%.  Widely patent mid/distal RCA stent.  Recommend ranolazine and continued aggressive secondary prevention. NSVT. Per prior heart monitor from outside office unavailable for review. Hyperlipidemia. Lipid panel 02/28/2023: LDL 76, HDL 66, TG 101, total 160. GERD. CKD stage IIIa.  In summary, patient establish care with Dr. Graciela Husbands in April 2024 due to accelerating angina.  She was admitted from the office for LHC which revealed significant one-vessel CAD for which she underwent PCI with DES as above.  She otherwise had moderate diffuse CAD.  EF 55 to 65% by ventriculography.  Patient was seen for office follow-up October 2024 and was doing well at that time.  Plavix was discontinued as she had completed 6 months of DAPT.  She was continued on aspirin for monotherapy.     History of Present Illness    Jacqueline TREJOS is followed by Dr. Graciela Husbands for the above outlined history.  Patient was last seen in the office by me on 02/28/2023 for evaluation of indigestion/heartburn symptoms at the request of PCP.  Patient reported 1 week of waxing and waning chest discomfort described as central to left-sided pressure going straight through to her back with minimal relief from antacids.  She reports this pain was the same discomfort she had prior to stent placement in April but somewhat less intense.  She had no acute changes on EKG at the time of her visit.  Given concern for anginal  equivalent she was scheduled for LHC which showed moderate two-vessel CAD with widely patent mid/distal RCA stent.  She was started on ranolazine.  Today, patient is overall doing well. She denies chest pain, shortness of breath, lower leg edema, orthopnea, pnd, lightheadedness or dizziness. 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 Cath site is stable. LHC was reviewed.    ROS: All other systems reviewed and are otherwise negative except as noted in History of Present Illness.  Studies Reviewed       [facility] 02/2023 Conclusions: Moderate two-vessel coronary artery disease, with 50% mid LAD and 60% OM2 stenoses that are not hemodynamically significant. Widely patent mid/distal RCA stent. Normal left ventricular systolic function (LVEF 55-65%) and filling pressure (LVEDP 10 mmHg).   Recommendations: No culprit lesion identified to explain recurrent chest pain.  Query microvascular dysfunction or noncardiac etiology. Start ranolazine 500 mg BID; repeat EKG should be repeated at follow-up in 2 weeks to assess QT interval. Continue aggressive secondary prevention of coronary artery disease.   Yvonne Kendall, MD [facility]   Physical Exam    VS:  There were no vitals taken for this visit. , BMI There is no height or weight on file to calculate BMI.  GEN: Well nourished, well developed, in no acute distress. Neck: No JVD or carotid bruits. Cardiac:  RRR. No murmurs. No rubs or gallops.   Respiratory:  Respirations regular and unlabored. Clear to auscultation without rales, wheezing  or rhonchi. GI: Soft, nontender, nondistended. Extremities: Radials/DP/PT 2+ and equal bilaterally. No clubbing or cyanosis. No edema   Skin: Warm and dry, no rash. Neuro: Strength intact.  Assessment & Plan   CAD S/p PCI with DES to RCA April 2024. Patient was changed to monotherapy with aspirin after completing DAPT in October 2024. Patient reported 1 week of anginal equivalent chest discomfort  described as pressure central chest straight through to back "like a vice" that comes and goes lasting minutes and not necessarily associated with exertion.  She underwent repeat LHC which showed moderate two-vessel CAD and widely patent mid to distal RCA stent.  Post-cath, patient is overall doing well. She denies anginal symptoms. Cath site, right radial, is stable. EKG shows NSR with stable intervals. -Continue aspirin, ranolazine, rosuvastatin, as needed SL NTG.   Hyperlipidemia LDL November 2024, not at goal (76).  -Continue rosuvastatin.  - the patient is not wanting another medication. I will increase Crestor to 40mg  daily.   Disposition: follow-up in 3 months with APP/MD     Signed, Etta Grandchild. Quandre Polinski, DNP, NP-C

## 2023-03-17 ENCOUNTER — Ambulatory Visit: Payer: PPO | Attending: Student | Admitting: Medical

## 2023-03-17 ENCOUNTER — Encounter: Payer: Self-pay | Admitting: Medical

## 2023-03-17 VITALS — BP 115/60 | HR 67 | Ht 66.0 in | Wt 187.6 lb

## 2023-03-17 DIAGNOSIS — I25118 Atherosclerotic heart disease of native coronary artery with other forms of angina pectoris: Secondary | ICD-10-CM | POA: Diagnosis not present

## 2023-03-17 DIAGNOSIS — E7800 Pure hypercholesterolemia, unspecified: Secondary | ICD-10-CM | POA: Diagnosis not present

## 2023-03-17 DIAGNOSIS — I200 Unstable angina: Secondary | ICD-10-CM

## 2023-03-17 MED ORDER — ROSUVASTATIN CALCIUM 40 MG PO TABS: 40.0000 mg | ORAL_TABLET | Freq: Every evening | ORAL | 3 refills | Status: DC

## 2023-03-17 NOTE — Patient Instructions (Signed)
Medication Instructions:  Your physician recommends the following medication changes.  INCREASE: Rosuvastatin (Crestor) to 40 mg bu mouth daily   *If you need a refill on your cardiac medications before your next appointment, please call your pharmacy*   Lab Work: No labs ordered today    Testing/Procedures: No test ordered today    Follow-Up: At [facility], you and your health needs are our priority.  As part of our continuing mission to provide you with exceptional heart care, we have created designated Provider Care Teams.  These Care Teams include your primary Cardiologist (physician) and Advanced Practice Providers (APPs -  Physician Assistants and Nurse Practitioners) who all work together to provide you with the care you need, when you need it.  We recommend signing up for the patient portal called "MyChart".  Sign up information is provided on this After Visit Summary.  MyChart is used to connect with patients for Virtual Visits (Telemedicine).  Patients are able to view lab/test results, encounter notes, upcoming appointments, etc.  Non-urgent messages can be sent to your provider as well.   To learn more about what you can do with MyChart, go to [email or web address].    Your next appointment:   3 month(s)  Provider:   You may see Sherryl Manges, MD or one of the following Advanced Practice Providers on your designated Care Team:   Nicolasa Ducking, NP Eula Listen, PA-C Cadence Fransico Michael, PA-C Charlsie Quest, NP Carlos Levering, NP

## 2023-04-03 ENCOUNTER — Encounter: Payer: Self-pay | Admitting: Urology

## 2023-04-03 ENCOUNTER — Ambulatory Visit: Payer: PPO | Admitting: Urology

## 2023-04-03 VITALS — BP 134/82 | HR 69

## 2023-04-03 DIAGNOSIS — N3946 Mixed incontinence: Secondary | ICD-10-CM | POA: Diagnosis not present

## 2023-04-03 MED ORDER — GEMTESA 75 MG PO TABS: 1.0000 | ORAL_TABLET | Freq: Every day | ORAL | 11 refills | Status: DC

## 2023-04-03 NOTE — Progress Notes (Signed)
04/03/2023 2:20 PM   Jacqueline Kaiser 18-Aug-1945 [id number]  Referring provider: Alba Cory, MD [address] [place],  [place] [address]  Chief Complaint  Patient presents with   Follow-up   Urinary Incontinence    HPI: Patient has cystocele repair on June 19, 2018.  She saw our nurse practitioner in May and she was pleased with the repair.  She was having some nocturia and was no longer on Myrbetriq and she did not feel she still needed it.  She was no longer using vaginal estrogen cream and did not feel she needed either medication.   Bladder little bit urgent but otherwise doing well.  No pain.  No incontinence.    On vaginal examination she had very good support anteriorly posteriorly with good vaginal length and no stress incontinence.  She had some poor estrogenization changes near the urethra.   The patient is complaining of urge incontinence and foot on the floor syndrome wearing 1 or 2 damp many pads per day.  She gets up once a night.  She can leak some with coughing sneezing and is seeing her primary care doctor tomorrow for constipation.  She is time voiding trying to stay drier.  She has a lot of urgency especially no the night   When I saw her prior to her surgery she was voiding every 1-2 hours and Myrbetriq was helping some of her urgency.  She still has low back discomfort that is nonspecific.  We had separated urgency goals first prolapse goals at that time.  Her bladder capacity was 320 mL.  She did not have stress incontinence reaching a pressure of 126 cm water.  She had bladder overactivity reaching a pressure of 13 cm of water leaking a mild to moderate amount   Symptoms definitely worse at night in the last month especially   Patient is gradually having more urgency and even urge incontinence. I will call if culture is positive. I gave her Myrbetriq 50 mg samples and prescription. I gave her oxybutynin ER 10 mg recognizing it could worsen her  constipation. We had a little bit of a circular conversation but there is no question she had this prior. Probably also has an element of a nocturnal diuresis    I last saw the patient in 2021 In the last 6 months patient is having a little bit of urge incontinence.  She wears 1 liner that sometimes is damp and sometimes dry.  Clinically not infected.  Leaked a few drops twice with bedwetting.  Voids every 3 hours gets up twice a night.     Reassess in 6 weeks on Myrbetriq 50 mg 30 x 11. Only repeat testing and physical examination if were not reaching her treatment goal. She has mild overactive bladder with mild urge incontinence      She can still have urge incontinence and Myrbetriq really did not help very much.  She says she definitely for leaking in the morning can have foot on the floor syndrome and very high volume voiding.  No ankle edema.  Clinically not infected but urine sent for culture    Reassess in 6 weeks on Gemtesa samples and prescription. Call if culture positive. Repeat pelvic examination if necessary and or recommend urodynamics and/or cystoscopy in the future depending on goals   Today Frequency stable.  Last culture negative. Urgency incontinence and foot on the floor syndrome dramatically better.  Not infected.  Very pleased   PMH: Past Medical  History:  Diagnosis Date   Allergy    Ankle fracture 2014   Left   Anxiety    CAD (coronary artery disease)    a. 07/2022 PCI: LM nl, LAD 75m, LCX mild diff dzs, OM1 30, RCA 48m, 61m/d (3.0x12 Onyx Frontier DES). EF 55-65%.   Female cystocele    Grade 2   Fracture    lower back after sneezing   GERD (gastroesophageal reflux disease)    History of   History of vertigo    last attack about 1 week ago   Hyperlipidemia    Insomnia    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   Right, .4 x 1.3 cm, noted on [place] ABD 05/2016, left lobe of the liver with the largest measuring 9.9 mm in diameter [place] ABD 06/2010   Low back sprain    Nonsustained  ventricular tachycardia (HCC)    by Holter,  noraml Stress test ECHO perpatient Katrinka Blazing, [place])   Obese    Osteoporosis    Sinusitis    Urinary frequency    Vertigo    Wrist fracture    Left    Surgical History: Past Surgical History:  Procedure Laterality Date   ABDOMINAL HYSTERECTOMY     BREAST BIOPSY Right    negative 10-12 years ago- core   CESAREAN SECTION     COLONOSCOPY  2012   CORONARY PRESSURE/FFR STUDY N/A 03/03/2023   Procedure: CORONARY PRESSURE/FFR STUDY;  Surgeon: Yvonne Kendall, MD;  Location: [facility] INVASIVE CV LAB;  Service: Cardiovascular;  Laterality: N/A;   CORONARY STENT INTERVENTION N/A 07/05/2022   Procedure: CORONARY STENT INTERVENTION;  Surgeon: Iran Ouch, MD;  Location: [facility] INVASIVE CV LAB;  Service: Cardiovascular;  Laterality: N/A;   CYSTOCELE REPAIR N/A 06/19/2018   Procedure: CYSTOSCOPY ANTERIOR REPAIR (CYSTOCELE);  Surgeon: Alfredo Martinez, MD;  Location: [facility];  Service: Urology;  Laterality: N/A;   LEFT HEART CATH AND CORONARY ANGIOGRAPHY N/A 07/05/2022   Procedure: LEFT HEART CATH AND CORONARY ANGIOGRAPHY;  Surgeon: Iran Ouch, MD;  Location: [facility] INVASIVE CV LAB;  Service: Cardiovascular;  Laterality: N/A;   LEFT HEART CATH AND CORONARY ANGIOGRAPHY Left 03/03/2023   Procedure: LEFT HEART CATH AND CORONARY ANGIOGRAPHY;  Surgeon: Yvonne Kendall, MD;  Location: [facility] INVASIVE CV LAB;  Service: Cardiovascular;  Laterality: Left;   TONSILLECTOMY      Home Medications:  Allergies as of 04/03/2023       Reactions   Betadine [povidone Iodine] Other (See Comments)   Burns   Iodine         Medication List        Accurate as of April 03, 2023  2:20 PM. If you have any questions, ask your nurse or doctor.          aspirin EC 81 MG tablet Take 1 tablet (81 mg total) by mouth daily. Swallow whole.   calcium carbonate 600 MG Tabs tablet Commonly known as: OS-CAL Take 600 mg by mouth daily with breakfast.    cholecalciferol 25 MCG (1000 UNIT) tablet Commonly known as: VITAMIN D3 Take 1,000 Units by mouth daily.   cyanocobalamin 500 MCG tablet Commonly known as: VITAMIN B12 Take 500 mcg by mouth daily.   Gemtesa 75 MG Tabs Generic drug: Vibegron Take 1 tablet by mouth daily.   meclizine 12.5 MG tablet Commonly known as: ANTIVERT Take 1 tablet (12.5 mg total) by mouth 4 (four) times daily as needed for dizziness.   pantoprazole 40 MG tablet Commonly known as: PROTONIX  TAKE 1 TABLET BY MOUTH DAILY   polyethylene glycol powder 17 GM/SCOOP powder Commonly known as: GLYCOLAX/MIRALAX Take 17 g by mouth daily.   Prolia 60 MG/ML Sosy injection Generic drug: denosumab Inject 1 mL into the skin every 6 (six) months.   QUEtiapine 25 MG tablet Commonly known as: SEROquel Take 1 tablet (25 mg total) by mouth at bedtime.   ranolazine 500 MG 12 hr tablet Commonly known as: RANEXA Take 1 tablet (500 mg total) by mouth 2 (two) times daily.   rosuvastatin 40 MG tablet Commonly known as: CRESTOR Take 1 tablet (40 mg total) by mouth every evening.   vitamin C 100 MG tablet Take 100 mg by mouth daily.        Allergies:  Allergies  Allergen Reactions   Betadine [Povidone Iodine] Other (See Comments)    Burns   Iodine     Family History: Family History  Problem Relation Age of Onset   Heart disease Mother        ATRIAL FIB   Cancer Father 8       Lung Ca,  nonsmoker, metastatic at diagnosis  asbestos exposure   Breast cancer Daughter 42       dx at age of 12 and 67    Social History:  reports that she quit smoking about 45 years ago. Her smoking use included cigarettes. She started smoking about 55 years ago. She has a 5 pack-year smoking history. She has been exposed to tobacco smoke. She has never used smokeless tobacco. She reports that she does not currently use alcohol. She reports that she does not use drugs.  ROS:                                         Physical Exam: BP 134/82   Pulse 69   Constitutional:  Alert and oriented, No acute distress. HEENT: [place] AT, moist mucus membranes.  Trachea midline, no masses.   Laboratory Data: Lab Results  Component Value Date   WBC 4.2 02/28/2023   HGB 13.7 02/28/2023   HCT 41.5 02/28/2023   MCV 93.3 02/28/2023   PLT 218 02/28/2023    Lab Results  Component Value Date   CREATININE 0.92 02/28/2023    No results found for: "PSA"  No results found for: "TESTOSTERONE"  No results found for: "HGBA1C"  Urinalysis    Component Value Date/Time   COLORURINE YELLOW 03/22/2016 0957   APPEARANCEUR Clear 02/06/2023 1110   LABSPEC 1.010 03/22/2016 0957   PHURINE 5.0 03/22/2016 0957   GLUCOSEU Negative 02/06/2023 1110   GLUCOSEU NEGATIVE 03/22/2016 0957   HGBUR NEGATIVE 03/22/2016 0957   BILIRUBINUR Negative 02/06/2023 1110   KETONESUR NEGATIVE 03/22/2016 0957   PROTEINUR Negative 02/06/2023 1110   UROBILINOGEN 0.2 03/22/2016 0957   NITRITE Negative 02/06/2023 1110   NITRITE NEGATIVE 03/22/2016 0957   LEUKOCYTESUR 2+ (A) 02/06/2023 1110    Pertinent Imaging:   Assessment & Plan: Assess durability in 4 months.  Prescription sent and samples given  1. Mixed incontinence (Primary)  - Urinalysis, Complete - CULTURE, URINE COMPREHENSIVE   No follow-ups on file.  Martina Sinner, MD  [facility] [address], [address] [place], [place] [address] [phone]

## 2023-04-04 LAB — URINALYSIS, COMPLETE
Bilirubin, UA: NEGATIVE
Glucose, UA: NEGATIVE
Ketones, UA: NEGATIVE
Nitrite, UA: NEGATIVE
Protein,UA: NEGATIVE
RBC, UA: NEGATIVE
Specific Gravity, UA: 1.01 (ref 1.005–1.030)
Urobilinogen, Ur: 0.2 mg/dL (ref 0.2–1.0)
pH, UA: 6.5 (ref 5.0–7.5)

## 2023-04-04 LAB — MICROSCOPIC EXAMINATION

## 2023-04-06 LAB — CULTURE, URINE COMPREHENSIVE

## 2023-04-11 DIAGNOSIS — M810 Age-related osteoporosis without current pathological fracture: Secondary | ICD-10-CM | POA: Diagnosis not present

## 2023-04-21 DIAGNOSIS — M810 Age-related osteoporosis without current pathological fracture: Secondary | ICD-10-CM | POA: Diagnosis not present

## 2023-06-20 DIAGNOSIS — D2271 Melanocytic nevi of right lower limb, including hip: Secondary | ICD-10-CM | POA: Diagnosis not present

## 2023-06-20 DIAGNOSIS — D2272 Melanocytic nevi of left lower limb, including hip: Secondary | ICD-10-CM | POA: Diagnosis not present

## 2023-06-20 DIAGNOSIS — L821 Other seborrheic keratosis: Secondary | ICD-10-CM | POA: Diagnosis not present

## 2023-06-20 DIAGNOSIS — D225 Melanocytic nevi of trunk: Secondary | ICD-10-CM | POA: Diagnosis not present

## 2023-06-20 DIAGNOSIS — D2262 Melanocytic nevi of left upper limb, including shoulder: Secondary | ICD-10-CM | POA: Diagnosis not present

## 2023-06-20 DIAGNOSIS — L570 Actinic keratosis: Secondary | ICD-10-CM | POA: Diagnosis not present

## 2023-06-20 DIAGNOSIS — D2261 Melanocytic nevi of right upper limb, including shoulder: Secondary | ICD-10-CM | POA: Diagnosis not present

## 2023-06-26 ENCOUNTER — Ambulatory Visit: Payer: PPO | Attending: Medical | Admitting: Medical

## 2023-06-26 ENCOUNTER — Encounter (INDEPENDENT_AMBULATORY_CARE_PROVIDER_SITE_OTHER): Payer: Self-pay

## 2023-06-26 ENCOUNTER — Encounter: Payer: Self-pay | Admitting: Medical

## 2023-06-26 VITALS — BP 112/62 | HR 65 | Ht 66.0 in | Wt 189.0 lb

## 2023-06-26 DIAGNOSIS — I25118 Atherosclerotic heart disease of native coronary artery with other forms of angina pectoris: Secondary | ICD-10-CM

## 2023-06-26 DIAGNOSIS — E669 Obesity, unspecified: Secondary | ICD-10-CM | POA: Diagnosis not present

## 2023-06-26 DIAGNOSIS — E785 Hyperlipidemia, unspecified: Secondary | ICD-10-CM | POA: Diagnosis not present

## 2023-06-26 MED ORDER — NITROGLYCERIN 0.4 MG SL SUBL: 0.4000 mg | SUBLINGUAL_TABLET | SUBLINGUAL | 3 refills | Status: AC | PRN

## 2023-06-26 MED ORDER — RANOLAZINE ER 500 MG PO TB12: 500.0000 mg | ORAL_TABLET | Freq: Two times a day (BID) | ORAL | 5 refills | Status: DC

## 2023-06-26 NOTE — Patient Instructions (Addendum)
 Medication Instructions:  Use nitroglycerin as prescribed.   *If you need a refill on your cardiac medications before your next appointment, please call your pharmacy*   Lab Work: Your provider would like for you to have following labs drawn today DIRECT LDL, LIPID.   If you have labs (blood work) drawn today and your tests are completely normal, you will receive your results only by: MyChart Message (if you have MyChart) OR A paper copy in the mail If you have any lab test that is abnormal or we need to change your treatment, we will call you to review the results.  Follow-Up: At [facility], you and your health needs are our priority.  As part of our continuing mission to provide you with exceptional heart care, we have created designated Provider Care Teams.  These Care Teams include your primary Cardiologist (physician) and Advanced Practice Providers (APPs -  Physician Assistants and Nurse Practitioners) who all work together to provide you with the care you need, when you need it.  We recommend signing up for the patient portal called "MyChart".  Sign up information is provided on this After Visit Summary.  MyChart is used to connect with patients for Virtual Visits (Telemedicine).  Patients are able to view lab/test results, encounter notes, upcoming appointments, etc.  Non-urgent messages can be sent to your provider as well.   To learn more about what you can do with MyChart, go to [email or web address].    Your next appointment:   6 month(s)  Provider:   Cadence Fransico Michael, PA-C    Other Instructions Referral to Weight Loss Management- they will call you for an appointment

## 2023-06-26 NOTE — Progress Notes (Signed)
  Cardiology Office Note:  .   Date:  06/26/2023  ID:  Jacqueline Kaiser, DOB 1945-10-15, MRN [id number] PCP: Alba Cory, MD  [facility] Providers Cardiologist:  Sherryl Manges, MD     History of Present Illness: .   Jacqueline Kaiser is a 78 y.o. female with a history of CAD s/p DES RCA 07/2022, nonsustained VT, hyperlipidemia, GERD, CKD stage III who presents for 84-month follow-up for CAD.  In April 2024 the patient was admitted from the office for accelerating angina.  Left heart cath showed significant one-vessel CAD for which she underwent PCI with DES to the mid to distal RCA.  She otherwise had moderate diffuse CAD.  EF 55 to 60% by ventriculography.  Plavix was discontinued after 6 months of DAPT.  In November 2024 she underwent relook cath for waxing and waning chest discomfort.  This showed moderate two-vessel CAD with widely patent stent.  She was started on Ranexa.  Patient was last seen in December 2024 was overall doing well from a cardiac perspective.  Crestor was increased.  Today, the patient is overall OK. She is still working part-time and she is on her feet at work. She denies chest pain, SOB, lower leg edema, orthopnea, pnd, lightheadedness, dizziness. She reports she is trying to lose weight. Diet is overall fairly healthy. Lifestyle discussed in depth today.  Studies Reviewed: Marland Kitchen       LHC 02/2023 Conclusions: Moderate two-vessel coronary artery disease, with 50% mid LAD and 60% OM2 stenoses that are not hemodynamically significant. Widely patent mid/distal RCA stent. Normal left ventricular systolic function (LVEF 55-65%) and filling pressure (LVEDP 10 mmHg).   Recommendations: No culprit lesion identified to explain recurrent chest pain.  Query microvascular dysfunction or noncardiac etiology. Start ranolazine 500 mg BID; repeat EKG should be repeated at follow-up in 2 weeks to assess QT interval. Continue aggressive secondary prevention of coronary artery  disease.   Yvonne Kendall, MD [facility]          Physical Exam:   VS:  BP 112/62 (BP Location: Left Arm)   Pulse 65   Ht 5\' 6"  (1.676 m)   Wt 189 lb (85.7 kg)   SpO2 99%   BMI 30.51 kg/m    Wt Readings from Last 3 Encounters:  06/26/23 189 lb (85.7 kg)  03/17/23 187 lb 9.6 oz (85.1 kg)  03/03/23 187 lb 12.8 oz (85.2 kg)    GEN: Well nourished, well developed in no acute distress NECK: No JVD; No carotid bruits CARDIAC: RRR, no murmurs, rubs, gallops RESPIRATORY:  Clear to auscultation without rales, wheezing or rhonchi  ABDOMEN: Soft, non-tender, non-distended EXTREMITIES:  No edema; No deformity   ASSESSMENT AND PLAN: .    CAD s/p PCI DES RCA in 07/2022 Patient denies anginal symptoms. She is active only at work. Continue aspirin 81 mg daily, Ranexa 500 mg twice daily and Crestor 40 mg daily.  I will send in sublingual nitro glycerin to use as needed for chest pain.  I will send in refills of other cardiac medications. No further ischemic work-up indicated at this time.  Obesity BMI 30.51. Patient is wanting to lose weight.  Diet and exercise discussed in depth today.  I will send in a weight loss referral.  Hyperlipidemia Prior LDL 76.  Crestor was increased at the last visit.  I will recheck a lipid panel with direct LDL.  Dispo: follow-up in 6 months  Signed, Hurshell Dino David Stall, PA-C

## 2023-06-27 LAB — LDL CHOLESTEROL, DIRECT: LDL Direct: 69 mg/dL (ref 0–99)

## 2023-06-27 LAB — LIPID PANEL
Chol/HDL Ratio: 2.3 ratio (ref 0.0–4.4)
Cholesterol, Total: 148 mg/dL (ref 100–199)
HDL: 64 mg/dL (ref >39)
LDL Chol Calc (NIH): 70 mg/dL (ref 0–99)
Triglycerides: 73 mg/dL (ref 0–149)
VLDL Cholesterol Cal: 14 mg/dL (ref 5–40)

## 2023-06-28 DIAGNOSIS — M810 Age-related osteoporosis without current pathological fracture: Secondary | ICD-10-CM | POA: Diagnosis not present

## 2023-07-17 ENCOUNTER — Other Ambulatory Visit: Payer: Self-pay | Admitting: Family Medicine

## 2023-07-17 DIAGNOSIS — F5101 Primary insomnia: Secondary | ICD-10-CM

## 2023-08-07 ENCOUNTER — Ambulatory Visit: Payer: Self-pay | Admitting: Urology

## 2023-08-07 ENCOUNTER — Encounter: Payer: Self-pay | Admitting: Family Medicine

## 2023-08-07 ENCOUNTER — Ambulatory Visit (INDEPENDENT_AMBULATORY_CARE_PROVIDER_SITE_OTHER): Payer: Self-pay | Admitting: Family Medicine

## 2023-08-07 VITALS — BP 122/74 | HR 74 | Resp 16 | Ht 66.0 in | Wt 190.8 lb

## 2023-08-07 DIAGNOSIS — K219 Gastro-esophageal reflux disease without esophagitis: Secondary | ICD-10-CM

## 2023-08-07 DIAGNOSIS — N1831 Chronic kidney disease, stage 3a: Secondary | ICD-10-CM | POA: Diagnosis not present

## 2023-08-07 DIAGNOSIS — K5909 Other constipation: Secondary | ICD-10-CM

## 2023-08-07 DIAGNOSIS — M810 Age-related osteoporosis without current pathological fracture: Secondary | ICD-10-CM | POA: Diagnosis not present

## 2023-08-07 DIAGNOSIS — M545 Low back pain, unspecified: Secondary | ICD-10-CM

## 2023-08-07 DIAGNOSIS — E7800 Pure hypercholesterolemia, unspecified: Secondary | ICD-10-CM

## 2023-08-07 DIAGNOSIS — I4729 Other ventricular tachycardia: Secondary | ICD-10-CM

## 2023-08-07 DIAGNOSIS — F5101 Primary insomnia: Secondary | ICD-10-CM | POA: Diagnosis not present

## 2023-08-07 DIAGNOSIS — Z1231 Encounter for screening mammogram for malignant neoplasm of breast: Secondary | ICD-10-CM | POA: Diagnosis not present

## 2023-08-07 DIAGNOSIS — I200 Unstable angina: Secondary | ICD-10-CM | POA: Diagnosis not present

## 2023-08-07 MED ORDER — QUETIAPINE FUMARATE 25 MG PO TABS: 25.0000 mg | ORAL_TABLET | Freq: Every day | ORAL | 1 refills | Status: DC

## 2023-08-07 MED ORDER — PANTOPRAZOLE SODIUM 20 MG PO TBEC: 20.0000 mg | DELAYED_RELEASE_TABLET | Freq: Every day | ORAL | 1 refills | Status: DC

## 2023-08-07 NOTE — Progress Notes (Signed)
 Name: Jacqueline Kaiser   MRN: [id number]    DOB: 1945-10-13   Date:08/07/2023       Progress Note  Subjective  Chief Complaint  Chief Complaint  Patient presents with   Medical Management of Chronic Issues   Discussed the use of AI scribe software for clinical note transcription with the patient, who gave verbal consent to proceed.  History of Present Illness Jacqueline Kaiser is a 78 year old female with stable angina and chronic kidney disease who presents for a regular follow-up.  She has CAD and has  angina, managed with Ranexa  500 mg twice daily, which has resolved her chest pain. She continues rosuvastatin  40 mg daily without muscle aches or side effects.  Her chronic kidney disease is stage 3A, with a recent GFR of 57. She avoids nephrotoxic medications and uses Tylenol  for pain management.  She has osteoporosis and receives Prolia  injections every six months since discontinuing Fosamax  in 2018, with no side effects or fractures.  She takes Gemtesa  for urinary frequency but finds it expensive and not significantly effective. She manages symptoms with pads and caffeine avoidance, experiencing increased urination in the morning that slows throughout the day.  She takes Seroquel  for sleep, pantoprazole  for heartburn, and uses Miralax  daily for constipation. Meclizine  is kept on hand for dizziness.  She has a history of non-sustained ventricular tachycardia noted before a stent procedure a year ago, with no current issues.  She works part-time, approximately four hours twice a week, and engages in yard work. She reports good hearing with occasional buzzing and no significant issues with weight or blood pressure.  No nausea, vomiting, changes in appetite, unusual mood changes, issues with urination, itching, or back pain.    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   Allergy 07/01/2013   Osteoporosis of lumbar spine 05/19/2013   Overweight (BMI 25.0-29.9) 08/28/2011   Peripheral vertigo 08/28/2011   Nonsustained ventricular tachycardia (HCC)    Hyperlipidemia     Past Surgical History:  Procedure Laterality Date   ABDOMINAL HYSTERECTOMY     BREAST BIOPSY Right    negative 10-12 years ago- core   CESAREAN SECTION     COLONOSCOPY  2012   CORONARY PRESSURE/FFR STUDY N/A 03/03/2023   Procedure: CORONARY PRESSURE/FFR STUDY;  Surgeon: Sammy Crisp, MD;  Location: [facility];  Service: Cardiovascular;  Laterality: N/A;   CORONARY STENT INTERVENTION N/A 07/05/2022   Procedure: CORONARY STENT INTERVENTION;  Surgeon: Wenona Hamilton, MD;  Location: [facility];  Service: Cardiovascular;  Laterality: N/A;   CYSTOCELE REPAIR N/A 06/19/2018   Procedure: CYSTOSCOPY ANTERIOR REPAIR (CYSTOCELE);  Surgeon: Erman Hayward, MD;  Location: [facility];  Service: Urology;  Laterality: N/A;   LEFT HEART CATH AND CORONARY ANGIOGRAPHY N/A 07/05/2022   Procedure: LEFT HEART CATH AND CORONARY ANGIOGRAPHY;  Surgeon: Wenona Hamilton, MD;  Location: [facility];  Service: Cardiovascular;  Laterality: N/A;   LEFT HEART CATH AND CORONARY ANGIOGRAPHY Left 03/03/2023   Procedure: LEFT  HEART CATH AND CORONARY ANGIOGRAPHY;  Surgeon: Sammy Crisp, MD;  Location: [facility];  Service: Cardiovascular;  Laterality: Left;   TONSILLECTOMY      Family History  Problem Relation Age of Onset   Heart disease Mother        ATRIAL FIB   Cancer Father 93        Lung Ca,  nonsmoker, metastatic at diagnosis  asbestos exposure   Breast cancer Daughter 6       dx at age of 41 and 44    Social History   Tobacco Use   Smoking status: Former    Current packs/day: 0.00    Average packs/day: 0.5 packs/day for 10.0 years (5.0 ttl pk-yrs)    Types: Cigarettes    Start date: 04/04/1968    Quit date: 04/04/1978    Years since quitting: 45.3    Passive exposure: Past   Smokeless tobacco: Never   Tobacco comments:    smoking cessation materials not required  Substance Use Topics   Alcohol use: Not Currently    Comment: wine occasionally     Current Outpatient Medications:    Ascorbic Acid  (VITAMIN C ) 100 MG tablet, Take 100 mg by mouth daily., Disp: , Rfl:    aspirin  EC 81 MG tablet, Take 1 tablet (81 mg total) by mouth daily. Swallow whole., Disp: 100 tablet, Rfl: 1   calcium  carbonate (OS-CAL) 600 MG TABS tablet, Take 600 mg by mouth daily with breakfast., Disp: , Rfl:    cholecalciferol  (VITAMIN D3) 25 MCG (1000 UT) tablet, Take 1,000 Units by mouth daily., Disp: , Rfl:    denosumab  (PROLIA ) 60 MG/ML SOSY injection, Inject 1 mL into the skin every 6 (six) months., Disp: , Rfl:    meclizine  (ANTIVERT ) 12.5 MG tablet, Take 1 tablet (12.5 mg total) by mouth 4 (four) times daily as needed for dizziness., Disp: 30 tablet, Rfl: 0   nitroGLYCERIN  (NITROSTAT ) 0.4 MG SL tablet, Place 1 tablet (0.4 mg total) under the tongue every 5 (five) minutes as needed for chest pain., Disp: 25 tablet, Rfl: 3   pantoprazole  (PROTONIX ) 40 MG tablet, TAKE 1 TABLET BY MOUTH DAILY, Disp: 30 tablet, Rfl: 9   polyethylene glycol powder (GLYCOLAX /MIRALAX ) 17 GM/SCOOP powder, Take 17 g by mouth daily., Disp: , Rfl:    QUEtiapine  (SEROQUEL ) 25 MG tablet, TAKE 1 TABLET BY MOUTH AT BEDTIME, Disp: 30 tablet, Rfl: 0   ranolazine  (RANEXA ) 500 MG 12 hr tablet, Take 1 tablet (500 mg total) by mouth 2 (two) times daily., Disp: 60 tablet, Rfl: 5   rosuvastatin  (CRESTOR ) 40 MG tablet,  Take 1 tablet (40 mg total) by mouth every evening., Disp: 90 tablet, Rfl: 3   vitamin B-12 (CYANOCOBALAMIN ) 500 MCG tablet, Take 500 mcg by mouth daily., Disp: , Rfl:    Vibegron  (GEMTESA ) 75 MG TABS, Take 1 tablet (75 mg total) by mouth daily. (Patient not taking: Reported on 08/07/2023), Disp: 30 tablet, Rfl: 11  Allergies  Allergen Reactions   Betadine [Povidone Iodine] Other (See Comments)    Burns   Iodine     I personally reviewed active problem list, medication list, allergies, family history with the patient/caregiver today.   ROS  Ten systems reviewed and is negative except as mentioned in HPI    Objective Physical Exam VITALS: BP- 122/76 MEASUREMENTS: Weight- about the same. CONSTITUTIONAL: Patient appears well-developed and well-nourished. No distress. HEENT: Head atraumatic, normocephalic, neck supple. Throat normal. CARDIOVASCULAR: Normal rate, regular rhythm  and normal heart sounds. No murmur heard. No BLE edema. PULMONARY: Effort normal and breath sounds normal. Lungs clear to auscultation bilaterally. No respiratory distress. MUSCULOSKELETAL: Normal gait. Without gross motor or sensory deficit. PSYCHIATRIC: Patient has a normal mood and affect. Behavior is normal. Judgment and thought content normal.  Vitals:   08/07/23 0928  BP: 122/74  Pulse: 74  Resp: 16  SpO2: 96%  Weight: 190 lb 12.8 oz (86.5 kg)  Height: 5\' 6"  (1.676 m)    Body mass index is 30.8 kg/m.  Recent Results (from the past 2160 hours)  LDL cholesterol, direct     Status: None   Collection Time: 06/26/23 10:00 AM  Result Value Ref Range   LDL Direct 69 0 - 99 mg/dL  Lipid panel     Status: None   Collection Time: 06/26/23 10:00 AM  Result Value Ref Range   Cholesterol, Total 148 100 - 199 mg/dL   Triglycerides 73 0 - 149 mg/dL   HDL 64 >09 mg/dL   VLDL Cholesterol Cal 14 5 - 40 mg/dL   LDL Chol Calc (NIH) 70 0 - 99 mg/dL   LDL CALC COMMENT: Comment     Comment: See LDL Comment if  reported.   Chol/HDL Ratio 2.3 0.0 - 4.4 ratio    Comment:                                   T. Chol/HDL Ratio                                             Men  Women                               1/2 Avg.Risk  3.4    3.3                                   Avg.Risk  5.0    4.4                                2X Avg.Risk  9.6    7.1                                3X Avg.Risk 23.4   11.0      PHQ2/9:    08/07/2023    9:21 AM 02/28/2023    8:38 AM 01/16/2023    9:54 AM 08/26/2022    3:07 PM 08/05/2022    2:36 PM  Depression screen PHQ 2/9  Decreased Interest 0 0 0 0 0  Down, Depressed, Hopeless 0 0 0 0 0  PHQ - 2 Score 0 0 0 0 0  Altered sleeping  0 0  0  Tired, decreased energy  0 0  0  Change in appetite  0 0  0  Feeling bad or failure about yourself   0 0  0  Trouble concentrating  0 0  0  Moving slowly or fidgety/restless  0 0  0  Suicidal thoughts  0 0  0  PHQ-9 Score  0 0  0  Difficult doing work/chores     Not difficult at all    phq 9 is negative  Fall Risk:    08/07/2023    9:21 AM 02/28/2023    8:38 AM 01/16/2023    9:54 AM 08/26/2022    3:07 PM 08/05/2022    2:36 PM  Fall Risk   Falls in the past year? 0 0 0 0 0  Number falls in past yr: 0 0  0 0  Injury with Fall? 0 0  0 0  Risk for fall due to : No Fall Risks No Fall Risks No Fall Risks No Fall Risks No Fall Risks  Follow up Falls prevention discussed;Education provided;Falls evaluation completed Falls prevention discussed Falls prevention discussed Falls evaluation completed Falls prevention discussed;Education provided;Falls evaluation completed     Assessment & Plan Unstable angina Intermittent chest pain improved with ranolazine .  - Continue ranolazine  500 mg twice daily. - Follow up with cardiologist in six months.  Coronary artery disease Managed with rosuvastatin  40 mg daily without side effects. - Continue rosuvastatin  40 mg daily.  Chronic kidney disease, stage 3a Stage 3A with stable GFR of 57.  Advised to avoid NSAIDs. Discussed potential use of Farxiga or Jardiance if function declines. - Monitor kidney function regularly. - Avoid NSAIDs; use acetaminophen  for pain management. - Consider medications like Farxiga or Jardiance if kidney function declines.  Gastroesophageal reflux disease Managed with pantoprazole . Discussed reducing dose due to potential negative effects on kidney function and bone mass. - Reduce pantoprazole  dose to 20 mg and monitor symptoms. - Increase dose if symptoms worsen.  Osteoporosis on Prolia  Managed with Prolia  injections every six months without side effects or fractures. Under the care of Dr. Lorelei Rogers  - Continue Prolia  injections every six months.  Overactive bladder Previously managed with Gemtesa , now managed with lifestyle modifications. - Avoid caffeinated beverages to manage symptoms. - Consider alternative medications if symptoms worsen.

## 2023-08-11 ENCOUNTER — Other Ambulatory Visit: Payer: Self-pay | Admitting: Physician Assistant

## 2023-09-06 ENCOUNTER — Other Ambulatory Visit: Payer: Self-pay | Admitting: Physician Assistant

## 2023-09-07 MED ORDER — PANTOPRAZOLE SODIUM 20 MG PO TBEC: 20.0000 mg | DELAYED_RELEASE_TABLET | Freq: Every day | ORAL | 3 refills | Status: AC

## 2023-09-25 ENCOUNTER — Other Ambulatory Visit (HOSPITAL_COMMUNITY): Payer: Self-pay

## 2023-09-25 ENCOUNTER — Other Ambulatory Visit: Payer: Self-pay

## 2023-09-26 ENCOUNTER — Other Ambulatory Visit (HOSPITAL_COMMUNITY): Payer: Self-pay

## 2023-09-26 MED ORDER — RANOLAZINE ER 500 MG PO TB12
500.0000 mg | ORAL_TABLET | Freq: Two times a day (BID) | ORAL | 5 refills | Status: DC
  Filled 2023-09-26: qty 60, 30d supply, fill #0
  Filled 2023-10-21: qty 60, 30d supply, fill #1

## 2023-10-21 ENCOUNTER — Other Ambulatory Visit (HOSPITAL_COMMUNITY): Payer: Self-pay

## 2023-10-24 ENCOUNTER — Other Ambulatory Visit: Payer: Self-pay

## 2023-10-24 ENCOUNTER — Telehealth: Payer: Self-pay | Admitting: Internal Medicine

## 2023-10-24 NOTE — Telephone Encounter (Signed)
 STAT if patient feels like he/she is going to faint   1. Are you feeling dizzy, lightheaded, or faint right now? yes    2. Have you passed out?  no (If yes move to .[facility])   3. Do you have any other symptoms? no   4. Have you checked your HR and BP (record if available)? no  Pt is in lobby waiting room with patient

## 2023-10-24 NOTE — Telephone Encounter (Signed)
 Went and spoke with the patient and her husband who just got done with an appointment at the clinic.  Jacqueline Kaiser wanted to be checked out by staff, due to dizziness.  She was advised that she would need to go to the emergency room for a full assessment.  She states that she feels like its her vertigo and she did not want to go to the emergency room and will instead go to her PCP's office.

## 2023-10-30 ENCOUNTER — Ambulatory Visit (INDEPENDENT_AMBULATORY_CARE_PROVIDER_SITE_OTHER): Admitting: Internal Medicine

## 2023-10-30 ENCOUNTER — Other Ambulatory Visit: Payer: Self-pay

## 2023-10-30 ENCOUNTER — Encounter: Payer: Self-pay | Admitting: Internal Medicine

## 2023-10-30 ENCOUNTER — Ambulatory Visit: Payer: Self-pay

## 2023-10-30 VITALS — BP 118/72 | HR 78 | Temp 97.7°F | Resp 16 | Ht 66.0 in | Wt 193.8 lb

## 2023-10-30 DIAGNOSIS — R82998 Other abnormal findings in urine: Secondary | ICD-10-CM

## 2023-10-30 DIAGNOSIS — R42 Dizziness and giddiness: Secondary | ICD-10-CM | POA: Diagnosis not present

## 2023-10-30 LAB — CBC WITH DIFFERENTIAL/PLATELET
Absolute Lymphocytes: 1295 {cells}/uL (ref 850–3900)
Absolute Monocytes: 440 {cells}/uL (ref 200–950)
Basophils Absolute: 30 {cells}/uL (ref 0–200)
Basophils Relative: 0.6 %
Eosinophils Absolute: 110 {cells}/uL (ref 15–500)
Eosinophils Relative: 2.2 %
HCT: 42.9 % (ref 35.0–45.0)
Hemoglobin: 14.1 g/dL (ref 11.7–15.5)
MCH: 31.5 pg (ref 27.0–33.0)
MCHC: 32.9 g/dL (ref 32.0–36.0)
MCV: 95.8 fL (ref 80.0–100.0)
MPV: 9.2 fL (ref 7.5–12.5)
Monocytes Relative: 8.8 %
Neutro Abs: 3125 {cells}/uL (ref 1500–7800)
Neutrophils Relative %: 62.5 %
Platelets: 216 Thousand/uL (ref 140–400)
RBC: 4.48 Million/uL (ref 3.80–5.10)
RDW: 12.2 % (ref 11.0–15.0)
Total Lymphocyte: 25.9 %
WBC: 5 Thousand/uL (ref 3.8–10.8)

## 2023-10-30 LAB — COMPREHENSIVE METABOLIC PANEL WITH GFR
AG Ratio: 2 (calc) (ref 1.0–2.5)
ALT: 11 U/L (ref 6–29)
AST: 19 U/L (ref 10–35)
Albumin: 4.5 g/dL (ref 3.6–5.1)
Alkaline phosphatase (APISO): 57 U/L (ref 37–153)
BUN/Creatinine Ratio: 20 (calc) (ref 6–22)
BUN: 20 mg/dL (ref 7–25)
CO2: 30 mmol/L (ref 20–32)
Calcium: 9.5 mg/dL (ref 8.6–10.4)
Chloride: 102 mmol/L (ref 98–110)
Creat: 1.01 mg/dL — ABNORMAL HIGH (ref 0.60–0.95)
Globulin: 2.2 g/dL (ref 1.9–3.7)
Glucose, Bld: 103 mg/dL — ABNORMAL HIGH (ref 65–99)
Potassium: 5.1 mmol/L (ref 3.5–5.3)
Sodium: 141 mmol/L (ref 135–146)
Total Bilirubin: 0.4 mg/dL (ref 0.2–1.2)
Total Protein: 6.7 g/dL (ref 6.1–8.1)
eGFR: 56 mL/min/1.73m2 — ABNORMAL LOW (ref >=60)

## 2023-10-30 LAB — POCT URINALYSIS DIPSTICK
Bilirubin, UA: NEGATIVE
Blood, UA: NEGATIVE
Glucose, UA: NEGATIVE
Ketones, UA: NEGATIVE
Nitrite, UA: NEGATIVE
Odor: NEGATIVE
Protein, UA: NEGATIVE
Spec Grav, UA: 1.02 (ref 1.010–1.025)
Urobilinogen, UA: 0.2 U/dL
pH, UA: 5 (ref 5.0–8.0)

## 2023-10-30 NOTE — Telephone Encounter (Signed)
 Appointment 07/28

## 2023-10-30 NOTE — Telephone Encounter (Signed)
 FYI Only or Action Required?: FYI only for provider.  Patient was last seen in primary care on 08/07/2023 by Glenard Mire, MD.  Called Nurse Triage reporting Dizziness.  Symptoms began a week ago.  Interventions attempted: Prescription medications: vertigo medicine.  Symptoms are: unchanged.  Triage Disposition: See Physician Within 24 Hours  Patient/caregiver understands and will follow disposition?: Yes, will follow disposition Copied from CRM #[id number]. Topic: Clinical - Red Word Triage >> Oct 30, 2023  8:45 AM Myrick T wrote: Red Word that prompted transfer to Nurse Triage: patient called stated she has been lightheaded with a headache for about a week off and on. Reason for Disposition  [1] MODERATE dizziness (e.g., interferes with normal activities) AND [2] has NOT been evaluated by doctor (or NP/PA) for this  (Exception: Dizziness caused by heat exposure, sudden standing, or poor fluid intake.)  Answer Assessment - Initial Assessment Questions 1. DESCRIPTION: Describe your dizziness.     Lightheadedness,  2. LIGHTHEADED: Do you feel lightheaded? (e.g., somewhat faint, woozy, weak upon standing)     Spacey, off balance 3. VERTIGO: Do you feel like either you or the room is spinning or tilting? (i.e., vertigo)     Have vertigo and this is different, vertigo meds do not help 4. SEVERITY: How bad is it?  Do you feel like you are going to faint? Can you stand and walk?     Still walks, but careful, still drives 5. ONSET:  When did the dizziness begin?     About a week ago 6. AGGRAVATING FACTORS: Does anything make it worse? (e.g., standing, change in head position)     Gatorade makes better, states that moving head does not change it, states taking allergy meds has helped also 8. CAUSE: What do you think is causing the dizziness? (e.g., decreased fluids or food, diarrhea, emotional distress, heat exposure, new medicine, sudden standing, vomiting; unknown)      denies 9. RECURRENT SYMPTOM: Have you had dizziness before? If Yes, ask: When was the last time? What happened that time?     States never had this before,  10. OTHER SYMPTOMS: Do you have any other symptoms? (e.g., fever, chest pain, vomiting, diarrhea, bleeding)       denies  Protocols used: Dizziness - Lightheadedness-A-AH

## 2023-10-30 NOTE — Progress Notes (Signed)
 Acute Office Visit  Subjective:     Patient ID: Jacqueline Kaiser, female    DOB: 11-May-1945, 78 y.o.   MRN: [id number]  Chief Complaint  Patient presents with   Dizziness    For 1 week    HPI Patient is in today for dizziness x 1 week. She is a Dr. Glenard patient and this is my first time I am meeting her.   Discussed the use of AI scribe software for clinical note transcription with the patient, who gave verbal consent to proceed.  History of Present Illness Jacqueline Kaiser is an 78 year old female who presents with dizziness and imbalance.  She experiences dizziness and a sensation of imbalance for about a week, described as feeling 'off balance' rather than vertiginous. Symptoms are intermittent, more pronounced in the morning, and improve throughout the day. Her current symptoms differ from her usual vertigo episodes, and Meclizine  is ineffective.  Earlier in the spring, she felt faint after eye dilation, which she attributes to the procedure. Her eye prescription has changed, and she is awaiting new glasses. She denies recent medication changes, dehydration, or new over-the-counter medications. She maintains adequate hydration without changes in urine color or frequency. No nausea, stomach upset, or bowel habit changes.  She experiences headaches, congestion, and occasional tinnitus, using a heat pack on her neck for headache relief. She has a history of stent placement and is on medical management. Previously considered hyperthyroid, she is not on thyroid  medication.    Review of Systems  Constitutional:  Negative for chills and fever.  HENT:  Negative for congestion, ear pain, sinus pain and sore throat.   Respiratory:  Negative for shortness of breath.   Cardiovascular:  Negative for chest pain.  Gastrointestinal: Negative.   Genitourinary: Negative.   Neurological:  Positive for dizziness and headaches. Negative for weakness.        Objective:    BP 118/72 (Cuff  Size: Large)   Pulse 78   Temp 97.7 F (36.5 C) (Oral)   Resp 16   Ht 5' 6 (1.676 m)   Wt 193 lb 12.8 oz (87.9 kg)   SpO2 99%   BMI 31.28 kg/m    Physical Exam Constitutional:      Appearance: Normal appearance.  HENT:     Head: Normocephalic and atraumatic.     Right Ear: Tympanic membrane, ear canal and external ear normal.     Left Ear: Tympanic membrane, ear canal and external ear normal.     Nose: Nose normal.     Mouth/Throat:     Mouth: Mucous membranes are moist.     Pharynx: Oropharynx is clear.  Eyes:     Extraocular Movements: Extraocular movements intact.     Conjunctiva/sclera: Conjunctivae normal.     Pupils: Pupils are equal, round, and reactive to light.  Neck:     Vascular: No carotid bruit.  Cardiovascular:     Rate and Rhythm: Normal rate and regular rhythm.  Pulmonary:     Effort: Pulmonary effort is normal.     Breath sounds: Normal breath sounds.  Skin:    General: Skin is warm and dry.  Neurological:     General: No focal deficit present.     Mental Status: She is alert. Mental status is at baseline.     Motor: No weakness.     Coordination: Coordination normal.     Gait: Gait normal.     Comments: Positive romberg test but  negative cerebellar testing, negative heel-to-shin and negative finger-to-nose  Psychiatric:        Mood and Affect: Mood normal.        Behavior: Behavior normal.     No results found for any visits on 10/30/23.      Assessment & Plan:   Assessment & Plan Dizziness Intermittent dizziness for a week, potentially related to recent eye prescription change. Positive Romberg test, other neurological tests normal. - Order blood tests for dehydration, electrolyte imbalances, and anemia. - Perform urinalysis to rule out infection. UA with 3+ leukocytes, will send for culture.  - Monitor symptoms and assess if new glasses alleviate dizziness.  - CBC w/Diff/Platelet - Comprehensive Metabolic Panel (CMET) - POCT  Urinalysis Dipstick   Return if symptoms worsen or fail to improve.  Sharyle Fischer, DO

## 2023-10-31 LAB — URINE CULTURE
MICRO NUMBER:: 16753615
Result:: NO GROWTH
SPECIMEN QUALITY:: ADEQUATE

## 2023-11-01 ENCOUNTER — Ambulatory Visit: Payer: Self-pay | Admitting: Internal Medicine

## 2023-11-20 ENCOUNTER — Other Ambulatory Visit: Payer: Self-pay | Admitting: Internal Medicine

## 2023-11-24 DIAGNOSIS — D2372 Other benign neoplasm of skin of left lower limb, including hip: Secondary | ICD-10-CM | POA: Diagnosis not present

## 2023-11-24 DIAGNOSIS — L821 Other seborrheic keratosis: Secondary | ICD-10-CM | POA: Diagnosis not present

## 2023-11-24 DIAGNOSIS — L858 Other specified epidermal thickening: Secondary | ICD-10-CM | POA: Diagnosis not present

## 2023-12-06 ENCOUNTER — Ambulatory Visit: Admitting: Cardiology

## 2023-12-20 ENCOUNTER — Ambulatory Visit: Admitting: Medical

## 2023-12-25 ENCOUNTER — Ambulatory Visit: Attending: Medical | Admitting: Medical

## 2023-12-25 ENCOUNTER — Encounter: Payer: Self-pay | Admitting: Medical

## 2023-12-25 VITALS — BP 124/80 | HR 64 | Wt 191.0 lb

## 2023-12-25 DIAGNOSIS — I25118 Atherosclerotic heart disease of native coronary artery with other forms of angina pectoris: Secondary | ICD-10-CM

## 2023-12-25 NOTE — Progress Notes (Signed)
 Cardiology Office Note   Date:  12/25/2023  ID:  Jacqueline Kaiser, Jacqueline Kaiser 14-Jul-1945, MRN [id number] PCP: Glenard Mire, MD  [facility] Providers Cardiologist:  Elspeth Sage, MD-transition to Dr. Mady Electrophysiologist:  OLE ONEIDA HOLTS, MD   History of Present Illness Jacqueline Kaiser is a 78 y.o. female with a history of CAD s/p DES RCA 07/2022, nonsustained VT, hyperlipidemia, GERD, CKD stage III who presents for 82-month follow-up for CAD.   In April 2024 the patient was admitted from the office for accelerating angina.  Left heart cath showed significant one-vessel CAD for which she underwent PCI with DES to the mid to distal RCA.  She otherwise had moderate diffuse CAD.  EF 55 to 60% by ventriculography.  Plavix  was discontinued after 6 months of DAPT.  In November 2024 she underwent relook cath for waxing and waning chest discomfort.  This showed moderate two-vessel CAD with widely patent stent.  She was started on Ranexa .  The patient was last seen 06/26/23 and was overall stable from a cardiac perspective.   Today, the patient is overall doing well.  She denies chest pain or SOB. She has trace lower leg edema, she feels this is exacerbated by weight. She has minimal lightheadedness in the AM. No falls or syncope. She works 2 days a week, she is on her feet the whole time. Diet is healthy.   Studies Reviewed EKG Interpretation Date/Time:  Monday December 25 2023 09:28:27 EDT Ventricular Rate:  64 PR Interval:  126 QRS Duration:  88 QT Interval:  392 QTC Calculation: 404 R Axis:   23  Text Interpretation: Normal sinus rhythm Nonspecific T wave abnormality When compared with ECG of 17-Mar-2023 10:23, Nonspecific T wave abnormality, improved in Inferior leads QT has lengthened Confirmed by Franchester, Naelle Diegel ([id number]) on 12/25/2023 9:52:23 AM    [facility]HC 02/2023 Conclusions: Moderate two-vessel coronary artery disease, with 50% mid LAD and 60% OM2 stenoses that are not  hemodynamically significant. Widely patent mid/distal RCA stent. Normal left ventricular systolic function (LVEF 55-65%) and filling pressure (LVEDP 10 mmHg).   Recommendations: No culprit lesion identified to explain recurrent chest pain.  Query microvascular dysfunction or noncardiac etiology. Start ranolazine  500 mg BID; repeat EKG should be repeated at follow-up in 2 weeks to assess QT interval. Continue aggressive secondary prevention of coronary artery disease.   Lonni Mady, MD [facility]  Physical Exam VS:  BP 124/80 (BP Location: Left Arm, Patient Position: Sitting, Cuff Size: Normal)   Pulse 64   Wt 191 lb (86.6 kg)   SpO2 99%   BMI 30.83 kg/m        Wt Readings from Last 3 Encounters:  12/25/23 191 lb (86.6 kg)  10/30/23 193 lb 12.8 oz (87.9 kg)  08/07/23 190 lb 12.8 oz (86.5 kg)    GEN: Well nourished, well developed in no acute distress NECK: No JVD; No carotid bruits CARDIAC: RRR, no murmurs, rubs, gallops RESPIRATORY:  Clear to auscultation without rales, wheezing or rhonchi  ABDOMEN: Soft, non-tender, non-distended EXTREMITIES:  No edema; No deformity   ASSESSMENT AND PLAN  CAD s/p PCI DES RCA in 07/2022 The patient denies anginal symptoms.  Patient has no formal activity, but is still working 2 days a week and job is very active.  We will continue aspirin  81 mg daily, sublingual nitro, Ranexa  500 mg twice daily, and Crestor  40 mg.  Obesity BMI 30.83. continue diet and exercise/walking.   HLD LDL 69. Continue Crestor  40mg  daily.  Dispo: 6 months with MD  Signed, Burle Kwan VEAR Fishman, PA-C

## 2023-12-25 NOTE — Patient Instructions (Signed)
 Medication Instructions:  Your physician recommends that you continue on your current medications as directed. Please refer to the Current Medication list given to you today.  *If you need a refill on your cardiac medications before your next appointment, please call your pharmacy*  Lab Work: No labs ordered today  If you have labs (blood work) drawn today and your tests are completely normal, you will receive your results only by: MyChart Message (if you have MyChart) OR A paper copy in the mail If you have any lab test that is abnormal or we need to change your treatment, we will call you to review the results.  Testing/Procedures: No labs ordered today   Follow-Up: At [facility], you and your health needs are our priority.  As part of our continuing mission to provide you with exceptional heart care, our providers are all part of one team.  This team includes your primary Cardiologist (physician) and Advanced Practice Providers or APPs (Physician Assistants and Nurse Practitioners) who all work together to provide you with the care you need, when you need it.  Your next appointment:   6 month(s)  Provider:   You may see Dr End or one of the following Advanced Practice Providers on your designated Care Team:   Lonni Meager, NP Lesley Maffucci, PA-C Bernardino Bring, PA-C Cadence Hopeton, PA-C Tylene Lunch, NP Barnie Hila, NP

## 2024-01-24 DIAGNOSIS — M810 Age-related osteoporosis without current pathological fracture: Secondary | ICD-10-CM | POA: Diagnosis not present

## 2024-02-05 ENCOUNTER — Ambulatory Visit (INDEPENDENT_AMBULATORY_CARE_PROVIDER_SITE_OTHER): Admitting: Family Medicine

## 2024-02-05 VITALS — BP 122/72 | HR 82 | Resp 16 | Ht 66.0 in | Wt 187.8 lb

## 2024-02-05 DIAGNOSIS — Z23 Encounter for immunization: Secondary | ICD-10-CM

## 2024-02-05 DIAGNOSIS — N1831 Chronic kidney disease, stage 3a: Secondary | ICD-10-CM | POA: Diagnosis not present

## 2024-02-05 DIAGNOSIS — K5909 Other constipation: Secondary | ICD-10-CM | POA: Insufficient documentation

## 2024-02-05 DIAGNOSIS — F5101 Primary insomnia: Secondary | ICD-10-CM | POA: Insufficient documentation

## 2024-02-05 DIAGNOSIS — M810 Age-related osteoporosis without current pathological fracture: Secondary | ICD-10-CM

## 2024-02-05 DIAGNOSIS — Z95820 Peripheral vascular angioplasty status with implants and grafts: Secondary | ICD-10-CM | POA: Diagnosis not present

## 2024-02-05 DIAGNOSIS — I4729 Other ventricular tachycardia: Secondary | ICD-10-CM | POA: Diagnosis not present

## 2024-02-05 DIAGNOSIS — K219 Gastro-esophageal reflux disease without esophagitis: Secondary | ICD-10-CM | POA: Diagnosis not present

## 2024-02-05 DIAGNOSIS — E7800 Pure hypercholesterolemia, unspecified: Secondary | ICD-10-CM | POA: Diagnosis not present

## 2024-02-05 MED ORDER — QUETIAPINE FUMARATE 25 MG PO TABS: 25.0000 mg | ORAL_TABLET | Freq: Every day | ORAL | 1 refills | Status: AC

## 2024-02-05 NOTE — Progress Notes (Signed)
 Name: Jacqueline Kaiser   MRN: [id number]    DOB: Sep 16, 1945   Date:02/05/2024       Progress Note  Subjective  Chief Complaint  Chief Complaint  Patient presents with   Medical Management of Chronic Issues   Discussed the use of AI scribe software for clinical note transcription with the patient, who gave verbal consent to proceed.  History of Present Illness Jacqueline Kaiser is an 78 year old female with chronic kidney disease and coronary artery disease who presents for a six-month follow-up. She is accompanied by her husband, who is in another room.  Since her last visit in May, she has seen a cardiologist who reported that everything was good. In July, she experienced dizziness and consulted Dr. Bernardo, who checked her blood work and urine. Her blood pressure was noted to be low at that time, and blood work was checked for possible electrolyte imbalance. She has chronic kidney disease, stage 3A, with kidney function that has been in the fifties. She manages her condition by keeping her blood pressure under control, staying hydrated, and avoiding medications like Aleve and Motrin , although she takes Aleve as needed for pain.  She has a history of coronary artery disease and had a stent placed over a year ago due to chest pain. A cardiac catheterization in November revealed moderate two-vessel coronary disease, with 50% stenosis of the mid LAD and 60% stenosis elsewhere, but the stent remains patent and her ejection fraction was reported as 55-65%. No chest tightness, discomfort, or symptoms of angina since the stent placement. She takes Ranexa  500 mg twice a day, rosuvastatin  40 mg for cholesterol, and a baby aspirin . She has nitroglycerin  but has not needed it. No episodes of non-sustained ventricular tachycardia or palpitations recently.  She takes pantoprazole  20 mg as needed for reflux and Seroquel  for sleep, which is effective without side effects. She was switched from Prolia  to a generic  due to insurance coverage issues.  She has a history of dizziness and tinnitus, which she has dealt with for many years. She uses meclizine  as needed for dizziness and keeps it on hand, especially when traveling.  Her weight has fluctuated slightly, currently at 180 lbs, which she notes is similar to her mother's weight at the same age. She manages constipation with over-the-counter Miralax  as needed. Her family history includes her mother living to 76 years old.    Patient Active Problem List   Diagnosis Date Noted   CAD (coronary artery disease) 07/06/2022   Hyperlipidemia LDL goal <70 07/06/2022   Unstable angina (HCC) 07/05/2022   Stage 3a chronic kidney disease (HCC) 07/04/2022   Coccyalgia 01/11/2021   Cystocele with prolapse 06/19/2018   Age-related osteoporosis without current pathological fracture 01/08/2018   History of vertebral compression fracture 05/23/2017   Incomplete bladder emptying 05/16/2016   GERD (gastroesophageal reflux disease) 02/14/2016   Insomnia due to anxiety and fear 02/14/2016   Allergy 07/01/2013   Osteoporosis of lumbar spine 05/19/2013   Overweight (BMI 25.0-29.9) 08/28/2011   Peripheral vertigo 08/28/2011   Nonsustained ventricular tachycardia (HCC)    Hyperlipidemia     Past Surgical History:  Procedure Laterality Date   ABDOMINAL HYSTERECTOMY     BREAST BIOPSY Right    negative 10-12 years ago- core   CESAREAN SECTION     COLONOSCOPY  2012   CORONARY PRESSURE/FFR STUDY N/A 03/03/2023   Procedure: CORONARY PRESSURE/FFR STUDY;  Surgeon: Mady Bruckner, MD;  Location: [facility];  Service: Cardiovascular;  Laterality: N/A;   CORONARY STENT INTERVENTION N/A 07/05/2022   Procedure: CORONARY STENT INTERVENTION;  Surgeon: Darron Deatrice LABOR, MD;  Location: [facility];  Service: Cardiovascular;  Laterality: N/A;   CYSTOCELE REPAIR N/A 06/19/2018   Procedure: CYSTOSCOPY ANTERIOR REPAIR (CYSTOCELE);  Surgeon: Gaston Hamilton, MD;   Location: [facility];  Service: Urology;  Laterality: N/A;   LEFT HEART CATH AND CORONARY ANGIOGRAPHY N/A 07/05/2022   Procedure: LEFT HEART CATH AND CORONARY ANGIOGRAPHY;  Surgeon: Darron Deatrice LABOR, MD;  Location: [facility];  Service: Cardiovascular;  Laterality: N/A;   LEFT HEART CATH AND CORONARY ANGIOGRAPHY Left 03/03/2023   Procedure: LEFT HEART CATH AND CORONARY ANGIOGRAPHY;  Surgeon: Mady Bruckner, MD;  Location: [facility];  Service: Cardiovascular;  Laterality: Left;   TONSILLECTOMY      Family History  Problem Relation Age of Onset   Heart disease Mother        ATRIAL FIB   Cancer Father 55       Lung Ca,  nonsmoker, metastatic at diagnosis  asbestos exposure   Breast cancer Daughter 29       dx at age of 25 and 39    Social History   Tobacco Use   Smoking status: Former    Current packs/day: 0.00    Average packs/day: 0.5 packs/day for 10.0 years (5.0 ttl pk-yrs)    Types: Cigarettes    Start date: 04/04/1968    Quit date: 04/04/1978    Years since quitting: 45.8    Passive exposure: Past   Smokeless tobacco: Never   Tobacco comments:    smoking cessation materials not required  Substance Use Topics   Alcohol use: Not Currently    Comment: wine occasionally     Current Outpatient Medications:    Ascorbic Acid  (VITAMIN C ) 100 MG tablet, Take 100 mg by mouth daily., Disp: , Rfl:    aspirin  EC 81 MG tablet, Take 1 tablet (81 mg total) by mouth daily. Swallow whole., Disp: 100 tablet, Rfl: 1   calcium  carbonate (OS-CAL) 600 MG TABS tablet, Take 600 mg by mouth daily with breakfast., Disp: , Rfl:    cholecalciferol  (VITAMIN D3) 25 MCG (1000 UT) tablet, Take 1,000 Units by mouth daily., Disp: , Rfl:    JUBBONTI 60 MG/ML SOSY injection, Inject 60 mg into the skin every 6 (six) months., Disp: , Rfl:    meclizine  (ANTIVERT ) 12.5 MG tablet, Take 1 tablet (12.5 mg total) by mouth 4 (four) times daily as needed for dizziness. (Patient taking differently: Take  12.5 mg by mouth as needed for dizziness.), Disp: 30 tablet, Rfl: 0   pantoprazole  (PROTONIX ) 20 MG tablet, Take 1 tablet (20 mg total) by mouth daily., Disp: 90 tablet, Rfl: 3   polyethylene glycol powder (GLYCOLAX /MIRALAX ) 17 GM/SCOOP powder, Take 17 g by mouth daily., Disp: , Rfl:    QUEtiapine  (SEROQUEL ) 25 MG tablet, Take 1 tablet (25 mg total) by mouth at bedtime., Disp: 90 tablet, Rfl: 1   ranolazine  (RANEXA ) 500 MG 12 hr tablet, Take 1 tablet (500 mg total) by mouth 2 (two) times daily., Disp: 60 tablet, Rfl: 5   rosuvastatin  (CRESTOR ) 40 MG tablet, Take 1 tablet (40 mg total) by mouth every evening., Disp: 90 tablet, Rfl: 3   vitamin B-12 (CYANOCOBALAMIN ) 500 MCG tablet, Take 500 mcg by mouth daily., Disp: , Rfl:    denosumab  (PROLIA ) 60 MG/ML SOSY injection, Inject 1 mL into the skin every  6 (six) months. (Patient not taking: Reported on 02/05/2024), Disp: , Rfl:    nitroGLYCERIN  (NITROSTAT ) 0.4 MG SL tablet, Place 1 tablet (0.4 mg total) under the tongue every 5 (five) minutes as needed for chest pain. (Patient taking differently: Place 0.4 mg under the tongue as needed for chest pain.), Disp: 25 tablet, Rfl: 3   ranolazine  (RANEXA ) 500 MG 12 hr tablet, TAKE 1 TABLET BY MOUTH TWICE DAILY (Patient not taking: Reported on 12/25/2023), Disp: 180 tablet, Rfl: 2  Allergies  Allergen Reactions   Betadine [Povidone Iodine] Other (See Comments)    Burns   Iodine     I personally reviewed active problem list, medication list, allergies, family history with the patient/caregiver today.   ROS  Ten systems reviewed and is negative except as mentioned in HPI    Objective Physical Exam  CONSTITUTIONAL: Patient appears well-developed and well-nourished.  No distress. HEENT: Head atraumatic, normocephalic, neck supple. CARDIOVASCULAR: Normal rate, regular rhythm and normal heart sounds.  No murmur heard. No BLE edema. PULMONARY: Effort normal and breath sounds normal. No respiratory  distress. ABDOMINAL: There is no tenderness or distention. MUSCULOSKELETAL: Normal gait. Without gross motor or sensory deficit. PSYCHIATRIC: Patient has a normal mood and affect. behavior is normal. Judgment and thought content normal.  Vitals:   02/05/24 0952  BP: 122/72  Pulse: 82  Resp: 16  SpO2: 98%  Weight: 187 lb 12.8 oz (85.2 kg)  Height: 5' 6 (1.676 m)    Body mass index is 30.31 kg/m.   PHQ2/9:    02/05/2024    9:47 AM 08/07/2023    9:21 AM 02/28/2023    8:38 AM 01/16/2023    9:54 AM 08/26/2022    3:07 PM  Depression screen PHQ 2/9  Decreased Interest 0 0 0 0 0  Down, Depressed, Hopeless 0 0 0 0 0  PHQ - 2 Score 0 0 0 0 0  Altered sleeping   0 0   Tired, decreased energy   0 0   Change in appetite   0 0   Feeling bad or failure about yourself    0 0   Trouble concentrating   0 0   Moving slowly or fidgety/restless   0 0   Suicidal thoughts   0 0   PHQ-9 Score   0 0     phq 9 is negative  Fall Risk:    02/05/2024    9:47 AM 08/07/2023    9:21 AM 02/28/2023    8:38 AM 01/16/2023    9:54 AM 08/26/2022    3:07 PM  Fall Risk   Falls in the past year? 0 0 0 0 0  Number falls in past yr: 0 0 0  0  Injury with Fall? 0 0 0  0  Risk for fall due to : No Fall Risks No Fall Risks No Fall Risks No Fall Risks No Fall Risks  Follow up Falls evaluation completed Falls prevention discussed;Education provided;Falls evaluation completed Falls prevention discussed Falls prevention discussed Falls evaluation completed     Assessment & Plan Chronic kidney disease, stage 3a Chronic kidney disease, stage 3a, with eGFR above 50. - Maintain blood pressure control. - Ensure adequate hydration. - Avoid nephrotoxic medications such as NSAIDs.  Coronary artery disease, post stent placement Coronary artery disease with stent placement in the RCA over a year ago. No recent angina or chest pain. Ejection fraction 55-65%. - Continue Ranexa  500 mg twice daily. - Continue  rosuvastatin   40 mg daily. - Continue low-dose aspirin . - Keep nitroglycerin  available for emergency use.  Non-sustained ventricular tachycardia Non-sustained ventricular tachycardia with no recent palpitations. - Continue current cardiac medications.  Hypercholesterolemia Hypercholesterolemia managed with rosuvastatin . LDL at 70, indicating good control. - Continue rosuvastatin  40 mg daily.  Osteoporosis Osteoporosis previously treated with Prolia , now on a generic due to insurance coverage. Under the care of Dr. Damian   Gastroesophageal reflux disease Gastroesophageal reflux disease managed with pantoprazole  as needed. - Continue pantoprazole  20 mg as needed.  Constipation Constipation managed with over-the-counter Miralax  as needed. - Continue using Miralax  as needed.  Insomnia Insomnia managed with quetiapine  (Seroquel ) for sleep. No side effects reported. - Continue quetiapine  for sleep.

## 2024-03-09 ENCOUNTER — Other Ambulatory Visit: Payer: Self-pay | Admitting: Medical

## 2024-03-09 DIAGNOSIS — E7800 Pure hypercholesterolemia, unspecified: Secondary | ICD-10-CM

## 2024-03-22 ENCOUNTER — Ambulatory Visit

## 2024-03-22 VITALS — BP 122/72 | Ht 66.0 in | Wt 182.0 lb

## 2024-03-22 DIAGNOSIS — Z0000 Encounter for general adult medical examination without abnormal findings: Secondary | ICD-10-CM | POA: Diagnosis not present

## 2024-03-22 NOTE — Patient Instructions (Signed)
 Jacqueline Kaiser,  Thank you for taking the time for your Medicare Wellness Visit. I appreciate your continued commitment to your health goals. Please review the care plan we discussed, and feel free to reach out if I can assist you further.  Please note that Annual Wellness Visits do not include a physical exam. Some assessments may be limited, especially if the visit was conducted virtually. If needed, we may recommend an in-person follow-up with your provider.  Ongoing Care Seeing your primary care provider every 3 to 6 months helps us  monitor your health and provide consistent, personalized care.   Referrals If a referral was made during today's visit and you haven't received any updates within two weeks, please contact the referred provider directly to check on the status.  Recommended Screenings:  Health Maintenance  Topic Date Due   Medicare Annual Wellness Visit  08/26/2023   Breast Cancer Screening  09/12/2023   Cologuard (Stool DNA test)  07/16/2024   COVID-19 Vaccine (9 - 2025-26 season) 08/13/2024   DTaP/Tdap/Td vaccine (3 - Td or Tdap) 10/29/2028   Pneumococcal Vaccine for age over 61  Completed   Flu Shot  Completed   Osteoporosis screening with Bone Density Scan  Completed   Zoster (Shingles) Vaccine  Completed   Meningitis B Vaccine  Aged Out   Colon Cancer Screening  Discontinued   Hepatitis C Screening  Discontinued       03/22/2024    8:19 AM  Advanced Directives  Does Patient Have a Medical Advance Directive? Yes  Type of Estate Agent of [place];Living will;Out of facility DNR (pink MOST or yellow form)  Does patient want to make changes to medical advance directive? No - Patient declined  Copy of Healthcare Power of Attorney in Chart? Yes - validated most recent copy scanned in chart (See row information)    Vision: Annual vision screenings are recommended for early detection of glaucoma, cataracts, and diabetic retinopathy. These exams  can also reveal signs of chronic conditions such as diabetes and high blood pressure.  Dental: Annual dental screenings help detect early signs of oral cancer, gum disease, and other conditions linked to overall health, including heart disease and diabetes.  Please see the attached documents for additional preventive care recommendations.

## 2024-03-22 NOTE — Progress Notes (Signed)
 " I connected with  Jacqueline Kaiser on 03/22/2024 by a audio enabled telemedicine application and verified that I am speaking with the correct person using two identifiers.  Patient Location: Home  Provider Location: Home Office  Persons Participating in Visit: Patient.  I discussed the limitations of evaluation and management by telemedicine. The patient expressed understanding and agreed to proceed.  Vital Signs: Because this visit was a virtual/telehealth visit, some criteria may be missing or patient reported. Any vitals not documented were not able to be obtained and vitals that have been documented are patient reported.  Chief Complaint  Patient presents with   Medicare Wellness     Subjective:   Jacqueline Kaiser is a 78 y.o. female who presents for a Medicare Annual Wellness Visit.  Visit info / Clinical Intake: Medicare Wellness Visit Type:: Subsequent Annual Wellness Visit Persons participating in visit and providing information:: patient Medicare Wellness Visit Mode:: Telephone If telephone:: video declined Since this visit was completed virtually, some vitals may be partially provided or unavailable. Missing vitals are due to the limitations of the virtual format.: Documented vitals are patient reported If Telephone or Video please confirm:: I connected with patient using audio/video enable telemedicine. I verified patient identity with two identifiers, discussed telehealth limitations, and patient agreed to proceed. Patient Location:: home Provider Location:: home office Interpreter Needed?: No Pre-visit prep was completed: yes AWV questionnaire completed by patient prior to visit?: no Living arrangements:: lives with spouse/significant other Patient's Overall Health Status Rating: very good Typical amount of pain: some Does pain affect daily life?: no Are you currently prescribed opioids?: no  Dietary Habits and Nutritional Risks How many meals a day?: 3 Eats  fruit and vegetables daily?: yes Most meals are obtained by: preparing own meals In the last 2 weeks, have you had any of the following?: none Diabetic:: no  Functional Status Activities of Daily Living (to include ambulation/medication): Independent Ambulation: Independent Medication Administration: Independent Home Management (perform basic housework or laundry): Independent Manage your own finances?: yes Primary transportation is: driving Concerns about vision?: no *vision screening is required for WTM* Concerns about hearing?: no  Fall Screening Falls in the past year?: 0 Number of falls in past year: 0 Was there an injury with Fall?: 0 Fall Risk Category Calculator: 0 Patient Fall Risk Level: Low Fall Risk  Fall Risk Patient at Risk for Falls Due to: No Fall Risks Fall risk Follow up: Falls evaluation completed  Home and Transportation Safety: All rugs have non-skid backing?: yes All stairs or steps have railings?: yes Grab bars in the bathtub or shower?: yes Have non-skid surface in bathtub or shower?: yes Good home lighting?: yes Regular seat belt use?: yes  Cognitive Assessment Difficulty concentrating, remembering, or making decisions? : no Will 6CIT or Mini Cog be Completed: yes What year is it?: 0 points What month is it?: 0 points Give patient an address phrase to remember (5 components): remember words apple , table, penny About what time is it?: 0 points Count backwards from 20 to 1: 0 points Say the months of the year in reverse: 0 points Repeat the address phrase from earlier: 0 points 6 CIT Score: 0 points  Advance Directives (For Healthcare) Does Patient Have a Medical Advance Directive?: Yes Does patient want to make changes to medical advance directive?: No - Patient declined Type of Advance Directive: Healthcare Power of [place]; Living will; Out of facility DNR (pink MOST or yellow form) Copy of Healthcare Power of  Attorney in Chart?: Yes -  validated most recent copy scanned in chart (See row information) Copy of Living Will in Chart?: Yes - validated most recent copy scanned in chart (See row information) Out of facility DNR (pink MOST or yellow form) in Chart? (Ambulatory ONLY): No - copy requested  Reviewed/Updated  Reviewed/Updated: Reviewed All (Medical, Surgical, Family, Medications, Allergies, Care Teams, Patient Goals)    Allergies (verified) Betadine [povidone iodine] and Iodine   Current Medications (verified) Outpatient Encounter Medications as of 03/22/2024  Medication Sig   Ascorbic Acid  (VITAMIN C ) 100 MG tablet Take 100 mg by mouth daily.   aspirin  EC 81 MG tablet Take 1 tablet (81 mg total) by mouth daily. Swallow whole.   calcium  carbonate (OS-CAL) 600 MG TABS tablet Take 600 mg by mouth daily with breakfast.   cholecalciferol  (VITAMIN D3) 25 MCG (1000 UT) tablet Take 1,000 Units by mouth daily.   denosumab  (PROLIA ) 60 MG/ML SOSY injection Inject 1 mL into the skin every 6 (six) months.   JUBBONTI 60 MG/ML SOSY injection Inject 60 mg into the skin every 6 (six) months.   meclizine  (ANTIVERT ) 12.5 MG tablet Take 1 tablet (12.5 mg total) by mouth 4 (four) times daily as needed for dizziness. (Patient taking differently: Take 12.5 mg by mouth as needed for dizziness.)   nitroGLYCERIN  (NITROSTAT ) 0.4 MG SL tablet Place 1 tablet (0.4 mg total) under the tongue every 5 (five) minutes as needed for chest pain. (Patient taking differently: Place 0.4 mg under the tongue as needed for chest pain.)   pantoprazole  (PROTONIX ) 20 MG tablet Take 1 tablet (20 mg total) by mouth daily.   polyethylene glycol powder (GLYCOLAX /MIRALAX ) 17 GM/SCOOP powder Take 17 g by mouth daily.   QUEtiapine  (SEROQUEL ) 25 MG tablet Take 1 tablet (25 mg total) by mouth at bedtime.   rosuvastatin  (CRESTOR ) 40 MG tablet TAKE ONE TABLET BY MOUTH EVERY EVENING   vitamin B-12 (CYANOCOBALAMIN ) 500 MCG tablet Take 500 mcg by mouth daily.   ranolazine   (RANEXA ) 500 MG 12 hr tablet TAKE 1 TABLET BY MOUTH TWICE DAILY (Patient not taking: Reported on 03/22/2024)   No facility-administered encounter medications on file as of 03/22/2024.    History: Past Medical History:  Diagnosis Date   Allergy    Ankle fracture 2014   Left   Anxiety    CAD (coronary artery disease)    a. 07/2022 PCI: LM nl, LAD 61m, LCX mild diff dzs, OM1 30, RCA 37m, 49m/d (3.0x12 Onyx Frontier DES). EF 55-65%.   Female cystocele    Grade 2   Fracture    lower back after sneezing   GERD (gastroesophageal reflux disease)    History of   History of vertigo    last attack about 1 week ago   Hyperlipidemia    Insomnia    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   Right, .4 x 1.3 cm, noted on US  ABD 05/2016, left lobe of the liver with the largest measuring 9.9 mm in diameter US  ABD 06/2010   Low back sprain    Nonsustained ventricular tachycardia (HCC)    by Holter,  noraml Stress test ECHO perpatient Beverlee, [place])   Obese    Osteoporosis    Sinusitis    Urinary frequency    Vertigo    Wrist fracture    Left   Past Surgical History:  Procedure Laterality Date   ABDOMINAL HYSTERECTOMY     BREAST BIOPSY Right    negative 10-12 years ago- core  CESAREAN SECTION     COLONOSCOPY  2012   CORONARY PRESSURE/FFR STUDY N/A 03/03/2023   Procedure: CORONARY PRESSURE/FFR STUDY;  Surgeon: Mady Bruckner, MD;  Location: [facility];  Service: Cardiovascular;  Laterality: N/A;   CORONARY STENT INTERVENTION N/A 07/05/2022   Procedure: CORONARY STENT INTERVENTION;  Surgeon: Darron Deatrice LABOR, MD;  Location: [facility];  Service: Cardiovascular;  Laterality: N/A;   CYSTOCELE REPAIR N/A 06/19/2018   Procedure: CYSTOSCOPY ANTERIOR REPAIR (CYSTOCELE);  Surgeon: Gaston Hamilton, MD;  Location: [facility];  Service: Urology;  Laterality: N/A;   LEFT HEART CATH AND CORONARY ANGIOGRAPHY N/A 07/05/2022   Procedure: LEFT HEART CATH AND CORONARY ANGIOGRAPHY;  Surgeon: Darron Deatrice LABOR,  MD;  Location: [facility];  Service: Cardiovascular;  Laterality: N/A;   LEFT HEART CATH AND CORONARY ANGIOGRAPHY Left 03/03/2023   Procedure: LEFT HEART CATH AND CORONARY ANGIOGRAPHY;  Surgeon: Mady Bruckner, MD;  Location: [facility];  Service: Cardiovascular;  Laterality: Left;   TONSILLECTOMY     Family History  Problem Relation Age of Onset   Heart disease Mother        ATRIAL FIB   Cancer Father 40       Lung Ca,  nonsmoker, metastatic at diagnosis  asbestos exposure   Breast cancer Daughter 76       dx at age of 12 and 47   Social History   Occupational History   Occupation: Retired  Tobacco Use   Smoking status: Former    Current packs/day: 0.00    Average packs/day: 0.5 packs/day for 10.0 years (5.0 ttl pk-yrs)    Types: Cigarettes    Start date: 04/04/1968    Quit date: 04/04/1978    Years since quitting: 45.9    Passive exposure: Past   Smokeless tobacco: Never   Tobacco comments:    smoking cessation materials not required  Vaping Use   Vaping status: Never Used  Substance and Sexual Activity   Alcohol use: Not Currently    Comment: wine occasionally   Drug use: No   Sexual activity: Not Currently    Birth control/protection: Surgical   Tobacco Counseling Counseling given: Not Answered Tobacco comments: smoking cessation materials not required  SDOH Screenings   Food Insecurity: No Food Insecurity (03/22/2024)  Housing: Low Risk (03/22/2024)  Transportation Needs: No Transportation Needs (03/22/2024)  Utilities: Not At Risk (03/22/2024)  Alcohol Screen: Low Risk (02/05/2024)  Depression (PHQ2-9): Low Risk (03/22/2024)  Financial Resource Strain: Low Risk (02/05/2024)  Physical Activity: Sufficiently Active (03/22/2024)  Social Connections: Moderately Integrated (03/22/2024)  Stress: No Stress Concern Present (03/22/2024)  Tobacco Use: Medium Risk (03/22/2024)  Health Literacy: Adequate Health Literacy (03/22/2024)   See flowsheets  for full screening details  Depression Screen PHQ 2 & 9 Depression Scale- Over the past 2 weeks, how often have you been bothered by any of the following problems? Little interest or pleasure in doing things: 0 Feeling down, depressed, or hopeless (PHQ Adolescent also includes...irritable): 0 PHQ-2 Total Score: 0 Trouble falling or staying asleep, or sleeping too much: 0 Feeling tired or having little energy: 0 Poor appetite or overeating (PHQ Adolescent also includes...weight loss): 0 Feeling bad about yourself - or that you are a failure or have let yourself or your family down: 0 Trouble concentrating on things, such as reading the newspaper or watching television (PHQ Adolescent also includes...like school work): 0 Moving or speaking so slowly that other people could have noticed. Or  the opposite - being so fidgety or restless that you have been moving around a lot more than usual: 0 Thoughts that you would be better off dead, or of hurting yourself in some way: 0 PHQ-9 Total Score: 0 If you checked off any problems, how difficult have these problems made it for you to do your work, take care of things at home, or get along with other people?: Not difficult at all  Depression Treatment Depression Interventions/Treatment : EYV7-0 Score <4 Follow-up Not Indicated     Goals Addressed             This Visit's Progress    Patient Stated       Patient would like to lose weight              Objective:    Today's Vitals   03/22/24 0817  BP: 122/72  Weight: 182 lb (82.6 kg)  Height: 5' 6 (1.676 m)   Body mass index is 29.38 kg/m.  Hearing/Vision screen Hearing Screening - Comments:: No difficulties  Vision Screening - Comments:: Patient wears glasses. [facility] Immunizations and Health Maintenance Health Maintenance  Topic Date Due   Medicare Annual Wellness (AWV)  08/26/2023   Mammogram  09/12/2023   Fecal DNA (Cologuard)  07/16/2024   COVID-19  Vaccine (9 - 2025-26 season) 08/13/2024   DTaP/Tdap/Td (3 - Td or Tdap) 10/29/2028   Pneumococcal Vaccine: 50+ Years  Completed   Influenza Vaccine  Completed   Bone Density Scan  Completed   Zoster Vaccines- Shingrix  Completed   Meningococcal B Vaccine  Aged Out   Colonoscopy  Discontinued   Hepatitis C Screening  Discontinued        Assessment/Plan:  This is a routine wellness examination for [facility].  Patient Care Team: Sowles, Krichna, MD as PCP - General (Family Medicine) Cindie Ole DASEN, MD as PCP - Electrophysiology (Cardiology) Damian Therisa HERO, MD as Physician Assistant (Endocrinology) Sowles, Krichna, MD as Attending Physician (Family Medicine)  I have personally reviewed and noted the following in the patients chart:   Medical and social history Use of alcohol, tobacco or illicit drugs  Current medications and supplements including opioid prescriptions. Functional ability and status Nutritional status Physical activity Advanced directives List of other physicians Hospitalizations, surgeries, and ER visits in previous 12 months Vitals Screenings to include cognitive, depression, and falls Referrals and appointments  No orders of the defined types were placed in this encounter.  In addition, I have reviewed and discussed with patient certain preventive protocols, quality metrics, and best practice recommendations. A written personalized care plan for preventive services as well as general preventive health recommendations were provided to patient.   Lyle MARLA Right, CMA   03/22/2024   No follow-ups on file.  After Visit Summary: (MyChart) Due to this being a telephonic visit, the after visit summary with patients personalized plan was offered to patient via MyChart   Nurse Notes: patient denied any questions or concerns. Patient has a mammogram referral placed at this time.  "

## 2024-05-01 ENCOUNTER — Ambulatory Visit: Admitting: Physician Assistant

## 2024-05-01 VITALS — BP 130/67 | HR 66 | Ht 66.0 in | Wt 184.0 lb

## 2024-05-01 DIAGNOSIS — N3281 Overactive bladder: Secondary | ICD-10-CM

## 2024-05-01 DIAGNOSIS — N3000 Acute cystitis without hematuria: Secondary | ICD-10-CM

## 2024-05-01 MED ORDER — GEMTESA 75 MG PO TABS: 75.0000 mg | ORAL_TABLET | Freq: Every day | ORAL | Status: AC

## 2024-05-01 MED ORDER — SULFAMETHOXAZOLE-TRIMETHOPRIM 800-160 MG PO TABS: 1.0000 | ORAL_TABLET | Freq: Two times a day (BID) | ORAL | 0 refills | Status: AC

## 2024-05-01 NOTE — Progress Notes (Signed)
 "  05/01/2024 2:55 PM   Jacqueline Kaiser 1945/11/13 [id number]  CC: Chief Complaint  Patient presents with   Urinary Incontinence   HPI: Jacqueline Kaiser is a 79 y.o. female with PMH POP s/p cystocele repair in 2020 and OAB wet previously on Gemtesa  who presents today for evaluation of urinary leakage.  She last saw Dr. Gaston on 04/03/2023, at which point she was given Gemtesa  samples with plans for follow-up in 4 months.  Today she reports she ran out of Gemtesa  samples quite some time ago and has been off pharmacotherapy.  She was doing rather well until a couple of months ago, when she developed worsening urgency and nocturia.  She denies dysuria.  In-office UA today positive for trace intact blood and 2+ leukocytes; urine microscopy with 11-30 WBCs/HPF, 3-10 RBCs/HPF, and many bacteria.  PMH: Past Medical History:  Diagnosis Date   Allergy    Ankle fracture 2014   Left   Anxiety    CAD (coronary artery disease)    a. 07/2022 PCI: LM nl, LAD 27m, LCX mild diff dzs, OM1 30, RCA 43m, 34m/d (3.0x12 Onyx Frontier DES). EF 55-65%.   Female cystocele    Grade 2   Fracture    lower back after sneezing   GERD (gastroesophageal reflux disease)    History of   History of vertigo    last attack about 1 week ago   Hyperlipidemia    Insomnia    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   Right, .4 x 1.3 cm, noted on US  ABD 05/2016, left lobe of the liver with the largest measuring 9.9 mm in diameter US  ABD 06/2010   Low back sprain    Nonsustained ventricular tachycardia (HCC)    by Holter,  noraml Stress test ECHO perpatient Beverlee, [place])   Obese    Osteoporosis    Sinusitis    Urinary frequency    Vertigo    Wrist fracture    Left    Surgical History: Past Surgical History:  Procedure Laterality Date   ABDOMINAL HYSTERECTOMY     BREAST BIOPSY Right    negative 10-12 years ago- core   CESAREAN SECTION     COLONOSCOPY  2012   CORONARY PRESSURE/FFR STUDY N/A 03/03/2023   Procedure:  CORONARY PRESSURE/FFR STUDY;  Surgeon: Mady Bruckner, MD;  Location: [facility];  Service: Cardiovascular;  Laterality: N/A;   CORONARY STENT INTERVENTION N/A 07/05/2022   Procedure: CORONARY STENT INTERVENTION;  Surgeon: Darron Deatrice LABOR, MD;  Location: [facility];  Service: Cardiovascular;  Laterality: N/A;   CYSTOCELE REPAIR N/A 06/19/2018   Procedure: CYSTOSCOPY ANTERIOR REPAIR (CYSTOCELE);  Surgeon: Gaston Hamilton, MD;  Location: [facility];  Service: Urology;  Laterality: N/A;   LEFT HEART CATH AND CORONARY ANGIOGRAPHY N/A 07/05/2022   Procedure: LEFT HEART CATH AND CORONARY ANGIOGRAPHY;  Surgeon: Darron Deatrice LABOR, MD;  Location: [facility];  Service: Cardiovascular;  Laterality: N/A;   LEFT HEART CATH AND CORONARY ANGIOGRAPHY Left 03/03/2023   Procedure: LEFT HEART CATH AND CORONARY ANGIOGRAPHY;  Surgeon: Mady Bruckner, MD;  Location: [facility];  Service: Cardiovascular;  Laterality: Left;   TONSILLECTOMY      Home Medications:  Allergies as of 05/01/2024       Reactions   Betadine [povidone Iodine] Other (See Comments)   Burns   Iodine         Medication List        Accurate as of May 01, 2024  2:55 PM. If you have any questions, ask your nurse or doctor.          STOP taking these medications    Prolia  60 MG/ML Sosy injection Generic drug: denosumab    ranolazine  500 MG 12 hr tablet Commonly known as: RANEXA        TAKE these medications    aspirin  EC 81 MG tablet Take 1 tablet (81 mg total) by mouth daily. Swallow whole.   calcium  carbonate 600 MG Tabs tablet Commonly known as: OS-CAL Take 600 mg by mouth daily with breakfast.   cholecalciferol  25 MCG (1000 UNIT) tablet Commonly known as: VITAMIN D3 Take 1,000 Units by mouth daily.   cyanocobalamin  500 MCG tablet Commonly known as: VITAMIN B12 Take 500 mcg by mouth daily.   Jubbonti 60 MG/ML Sosy injection Generic drug: denosumab -bbdz Inject 60 mg  into the skin every 6 (six) months.   meclizine  12.5 MG tablet Commonly known as: ANTIVERT  Take 1 tablet (12.5 mg total) by mouth 4 (four) times daily as needed for dizziness. What changed: when to take this   nitroGLYCERIN  0.4 MG SL tablet Commonly known as: NITROSTAT  Place 1 tablet (0.4 mg total) under the tongue every 5 (five) minutes as needed for chest pain. What changed: when to take this   pantoprazole  20 MG tablet Commonly known as: PROTONIX  Take 1 tablet (20 mg total) by mouth daily.   polyethylene glycol powder 17 GM/SCOOP powder Commonly known as: GLYCOLAX /MIRALAX  Take 17 g by mouth daily.   QUEtiapine  25 MG tablet Commonly known as: SEROQUEL  Take 1 tablet (25 mg total) by mouth at bedtime.   rosuvastatin  40 MG tablet Commonly known as: CRESTOR  TAKE ONE TABLET BY MOUTH EVERY EVENING   vitamin C  100 MG tablet Take 100 mg by mouth daily.        Allergies:  Allergies[1]  Family History: Family History  Problem Relation Age of Onset   Heart disease Mother        ATRIAL FIB   Cancer Father 72       Lung Ca,  nonsmoker, metastatic at diagnosis  asbestos exposure   Breast cancer Daughter 50       dx at age of 99 and 68    Social History:   reports that she quit smoking about 46 years ago. Her smoking use included cigarettes. She started smoking about 56 years ago. She has a 5 pack-year smoking history. She has been exposed to tobacco smoke. She has never used smokeless tobacco. She reports that she does not currently use alcohol. She reports that she does not use drugs.  Physical Exam: BP 130/67   Pulse 66   Ht 5' 6 (1.676 m)   Wt 184 lb (83.5 kg)   SpO2 97%   BMI 29.70 kg/m   Constitutional:  Alert and oriented, no acute distress, nontoxic appearing HEENT: [place], AT Cardiovascular: No clubbing, cyanosis, or edema Respiratory: Normal respiratory effort, no increased work of breathing Skin: No rashes, bruises or suspicious lesions Neurologic: Grossly  intact, no focal deficits, moving all 4 extremities Psychiatric: Normal mood and affect  Laboratory Data: See Epic   Assessment & Plan:   1. OAB (overactive bladder) (Primary) Worsening urgency and nocturia, possibly related to #2 below.  Off pharmacotherapy.  I will give her some Gemtesa  samples today and have her follow-up with Dr. MacDiarmid. - Vibegron  (GEMTESA ) 75 MG TABS; Take 1 tablet (75 mg total) by mouth daily.  2. Acute cystitis without  hematuria UA appears positive today, possibly contributory to worsening storage related symptoms as above.  Will start empiric Bactrim  and send for culture. - Urinalysis, Complete - CULTURE, URINE COMPREHENSIVE - sulfamethoxazole -trimethoprim  (BACTRIM  DS) 800-160 MG tablet; Take 1 tablet by mouth 2 (two) times daily for 5 days.  Dispense: 10 tablet; Refill: 0   Return for OAB follow-up with Dr. MacDiarmid.  Lucie Hones, PA-C  [facility] [address], [address] [place], [place] [address] [phone]      [1]  Allergies Allergen Reactions   Betadine [Povidone Iodine] Other (See Comments)    Burns   Iodine    "

## 2024-05-02 ENCOUNTER — Ambulatory Visit: Admitting: Cardiology

## 2024-05-02 LAB — URINALYSIS, COMPLETE
Bilirubin, UA: NEGATIVE
Glucose, UA: NEGATIVE
Ketones, UA: NEGATIVE
Nitrite, UA: NEGATIVE
Protein,UA: NEGATIVE
Specific Gravity, UA: 1.01 (ref 1.005–1.030)
Urobilinogen, Ur: 0.2 mg/dL (ref 0.2–1.0)
pH, UA: 5.5 (ref 5.0–7.5)

## 2024-05-02 LAB — MICROSCOPIC EXAMINATION

## 2024-05-06 LAB — CULTURE, URINE COMPREHENSIVE

## 2024-05-07 ENCOUNTER — Ambulatory Visit: Payer: Self-pay | Admitting: Physician Assistant

## 2024-05-08 ENCOUNTER — Other Ambulatory Visit: Payer: Self-pay

## 2024-05-08 MED ORDER — CIPROFLOXACIN HCL 250 MG PO TABS: 250.0000 mg | ORAL_TABLET | Freq: Two times a day (BID) | ORAL | 0 refills | Status: AC

## 2024-05-08 MED ORDER — CIPROFLOXACIN HCL 500 MG PO TABS: 500.0000 mg | ORAL_TABLET | Freq: Two times a day (BID) | ORAL | 0 refills | Status: DC

## 2024-05-08 NOTE — Progress Notes (Signed)
 Called patient and patient understood. Cipro  sent in.

## 2024-06-17 ENCOUNTER — Ambulatory Visit: Admitting: Medical

## 2024-07-08 ENCOUNTER — Ambulatory Visit: Admitting: Urology

## 2024-08-06 ENCOUNTER — Ambulatory Visit: Admitting: Family Medicine
